# Patient Record
Sex: Male | Born: 1955
Health system: Southern US, Community
[De-identification: ages and names within clinical notes are randomized; demographics above are authoritative.]

## PROBLEM LIST (undated history)

## (undated) DIAGNOSIS — K219 Gastro-esophageal reflux disease without esophagitis: Secondary | ICD-10-CM

## (undated) DIAGNOSIS — I10 Essential (primary) hypertension: Secondary | ICD-10-CM

## (undated) DIAGNOSIS — E079 Disorder of thyroid, unspecified: Secondary | ICD-10-CM

## (undated) DIAGNOSIS — J45909 Unspecified asthma, uncomplicated: Secondary | ICD-10-CM

## (undated) DIAGNOSIS — T7840XA Allergy, unspecified, initial encounter: Secondary | ICD-10-CM

## (undated) HISTORY — DX: Gastro-esophageal reflux disease without esophagitis: K21.9

## (undated) HISTORY — DX: Allergy, unspecified, initial encounter: T78.40XA

## (undated) HISTORY — DX: Unspecified asthma, uncomplicated: J45.909

## (undated) HISTORY — PX: SPINE SURGERY: SHX786

## (undated) HISTORY — PX: LUMBAR DISC ARTHROPLASTY: SHX699

---

## 2000-05-11 ENCOUNTER — Ambulatory Visit (HOSPITAL_COMMUNITY): Admission: RE | Admit: 2000-05-11 | Discharge: 2000-05-11 | Payer: Self-pay | Admitting: *Deleted

## 2004-04-23 ENCOUNTER — Encounter: Admission: RE | Admit: 2004-04-23 | Discharge: 2004-04-23 | Payer: Self-pay | Admitting: Family Medicine

## 2006-08-02 ENCOUNTER — Encounter: Admission: RE | Admit: 2006-08-02 | Discharge: 2006-08-02 | Payer: Self-pay | Admitting: Gastroenterology

## 2008-04-10 ENCOUNTER — Encounter: Payer: Self-pay | Admitting: Pulmonary Disease

## 2008-05-12 ENCOUNTER — Ambulatory Visit: Payer: Self-pay | Admitting: Pulmonary Disease

## 2008-05-12 DIAGNOSIS — R05 Cough: Secondary | ICD-10-CM

## 2008-05-12 DIAGNOSIS — R059 Cough, unspecified: Secondary | ICD-10-CM

## 2008-05-12 DIAGNOSIS — R052 Subacute cough: Secondary | ICD-10-CM

## 2008-05-12 DIAGNOSIS — I10 Essential (primary) hypertension: Secondary | ICD-10-CM | POA: Insufficient documentation

## 2008-05-12 DIAGNOSIS — E1159 Type 2 diabetes mellitus with other circulatory complications: Secondary | ICD-10-CM | POA: Insufficient documentation

## 2008-05-12 HISTORY — DX: Cough, unspecified: R05.9

## 2008-05-12 HISTORY — DX: Subacute cough: R05.2

## 2012-01-06 ENCOUNTER — Emergency Department (HOSPITAL_COMMUNITY)
Admission: EM | Admit: 2012-01-06 | Discharge: 2012-01-06 | Disposition: A | Discharge status: Home / Self Care | Payer: BC Managed Care – PPO | Source: Home / Self Care

## 2012-01-06 ENCOUNTER — Encounter (HOSPITAL_COMMUNITY): Payer: Self-pay | Admitting: *Deleted

## 2012-01-06 DIAGNOSIS — R51 Headache: Secondary | ICD-10-CM

## 2012-01-06 DIAGNOSIS — R220 Localized swelling, mass and lump, head: Secondary | ICD-10-CM

## 2012-01-06 DIAGNOSIS — R221 Localized swelling, mass and lump, neck: Secondary | ICD-10-CM

## 2012-01-06 HISTORY — DX: Essential (primary) hypertension: I10

## 2012-01-06 MED ORDER — AMLODIPINE BESYLATE 5 MG PO TABS: 5.0000 mg | ORAL_TABLET | Freq: Every day | ORAL | Status: DC

## 2012-01-06 NOTE — ED Notes (Signed)
Pt     Reports    He         Ran out  Of       His      norvasc       He  Reports   Having  A  Headache   As  Well  -  He  Is      Sitting  Upright  On  The    Exam table  Speaking in  Complete       sentances            His  pearla   Speech is  clar   Skin is  Warm  And   Dry

## 2012-01-06 NOTE — ED Provider Notes (Signed)
History     CSN: [id number]  Arrival date & time 01/06/12  0806   None     Chief Complaint  Patient presents with  . Medication Refill    (Consider location/radiation/quality/duration/timing/severity/associated sxs/prior treatment) HPI CC: HA, Mass  HA: 2 mo h/o R sided HA that comes and goes. Typically lasts about 1 hr. Improves w/ rest. Out of BP medications for last 2 mo. Previous doctor no longer working so unable to get Rx refilled. Denies any vision loss, LOC, dizziness, change in speech or hearing, no loss of motor function  Mass: Present for 6 mo in R occipital region at skull base. Non-painful to palpation. No irritation. Denies fevers, night sweats, unintentional wt loss,   Past Medical History  Diagnosis Date  . Hypertension     History reviewed. No pertinent past surgical history.  No family history on file.  History  Substance Use Topics  . Smoking status: Not on file  . Smokeless tobacco: Not on file  . Alcohol Use:    No family h/o malignancy   Review of Systems Per hpi Allergies  Review of patient's allergies indicates not on file.  Home Medications   Current Outpatient Rx  Name Route Sig Dispense Refill  . AMLODIPINE BESYLATE 5 MG PO TABS Oral Take 1 tablet (5 mg total) by mouth daily. 30 tablet 1    BP 156/90  Pulse 60  Temp 97.8 F (36.6 C) (Oral)  Resp 12  SpO2 100%  Physical Exam Gen: NAD HEENT: PERRL, EOMI, no papillary edema, oropharynx clear, no cervical lymphadenopathy, Large 1.5x2cm firm non-movable mass at R skull base. Nonpainful to palpation. Hearing and vision symmetrical Neuro: CN intact, cerebellar and proprioception normal, gait normal CV: RRR  ED Course  Procedures (including critical care time)  Labs Reviewed - No data to display No results found.   1. Mass of head   2. Headache       MDM  56yo m w/ HA likley from uncontrolled HTN w/ new mass - Refill Amlodipine - Refer to general surgery - refer to  FM for f/u care        Ozella Rocks, MD 01/06/12 [phone]

## 2012-01-06 NOTE — ED Provider Notes (Signed)
Medical screening examination/treatment/procedure(s) were performed by resident physician or non-physician practitioner and as supervising physician I was immediately available for consultation/collaboration.   Barkley Bruns MD.    Linna Hoff, MD 01/06/12 1258

## 2012-01-12 ENCOUNTER — Emergency Department (HOSPITAL_COMMUNITY)
Admission: EM | Admit: 2012-01-12 | Discharge: 2012-01-12 | Disposition: A | Discharge status: Home / Self Care | Payer: BC Managed Care – PPO | Source: Home / Self Care | Attending: Emergency Medicine | Admitting: Emergency Medicine

## 2012-01-12 ENCOUNTER — Encounter (HOSPITAL_COMMUNITY): Payer: Self-pay | Admitting: *Deleted

## 2012-01-12 DIAGNOSIS — M722 Plantar fascial fibromatosis: Secondary | ICD-10-CM

## 2012-01-12 MED ORDER — METHYLPREDNISOLONE ACETATE 40 MG/ML IJ SUSP
INTRAMUSCULAR | Status: AC
  Filled 2012-01-12: qty 5

## 2012-01-12 NOTE — ED Provider Notes (Signed)
Chief Complaint  Patient presents with  . Foot Pain    History of Present Illness:   The patient is a 56 year old [place] male who has had a four-day history of pain in the plantar aspect of his right heel. He denies any injury or swelling. It hurts worse first thing in the morning and after prolonged standing or walking. He denies any pain in the calf, ankle, or dorsum of the foot. He denies any numbness, tingling, weakness of the lower extremities.  Review of Systems:  Other than noted above, the patient denies any of the following symptoms: Systemic:  No fevers, chills, sweats, or aches.  No fatigue or tiredness. Musculoskeletal:  No joint pain, arthritis, bursitis, swelling, back pain, or neck pain. Neurological:  No muscular weakness, paresthesias, headache, or trouble with speech or coordination.  No dizziness.  PMFSH:  Past medical history, family history, social history, meds, and allergies were reviewed.  Physical Exam:   Vital signs:  BP 149/92  Pulse 81  Temp 99.5 F (37.5 C) (Oral)  Resp 18  SpO2 97% Gen:  Alert and oriented times 3.  In no distress. Musculoskeletal: There was pain to palpation over the insertion of the plantar fascia no swelling. Otherwise, all joints had a full a ROM with no swelling, bruising or deformity.  No edema, pulses full. Extremities were warm and pink.  Capillary refill was brisk.  Skin:  Clear, warm and dry.  No rash. Neuro:  Alert and oriented times 3.  Muscle strength was normal.  Sensation was intact to light touch.   Procedure Note:  Verbal informed consent was obtained from the patient.  Risks and benefits were outlined with the patient.  Patient understands and accepts these risks.  Identity of the patient was confirmed verbally and by armband.    Procedure was performed as followed:  Medial aspect of the foot was prepped with Betadine and alcohol and anesthetized with ethyl chloride spray. Using a 1-1/2 inch 27-gauge needle, 1 cc of  Depo-Medrol 40 mg strength and 1 cc of 2% Xylocaine were injected around the insertion of the plantar fascia.  Patient tolerated the procedure well without any immediate complications. He was given aftercare instructions.  Assessment:  The encounter diagnosis was Plantar fasciitis.  Plan:   1.  The following meds were prescribed:   New Prescriptions   No medications on file   2.  The patient was instructed in symptomatic care, including rest and activity, elevation, application of ice and compression.  Appropriate handouts were given. I suggested that he rest for 2-3 days, stay off his feet, and apply ice. Thereafter he should begin stretching exercises. 3.  The patient was told to return if becoming worse in any way, if no better in 3 or 4 days, and given some red flag symptoms that would indicate earlier return.   4.  The patient was told to follow up with Dr. Cristie Hem if no better in 2 weeks.   Reuben Likes, MD 01/12/12 2207

## 2012-01-12 NOTE — ED Notes (Signed)
Pt reports right heel pain with no known injury for the past 4 days

## 2012-02-15 ENCOUNTER — Encounter (HOSPITAL_COMMUNITY): Payer: Self-pay | Admitting: *Deleted

## 2012-02-15 ENCOUNTER — Emergency Department (HOSPITAL_COMMUNITY)
Admission: EM | Admit: 2012-02-15 | Discharge: 2012-02-15 | Disposition: A | Discharge status: Home / Self Care | Payer: BC Managed Care – PPO | Source: Home / Self Care

## 2012-02-15 ENCOUNTER — Emergency Department (INDEPENDENT_AMBULATORY_CARE_PROVIDER_SITE_OTHER): Payer: BC Managed Care – PPO

## 2012-02-15 DIAGNOSIS — M722 Plantar fascial fibromatosis: Secondary | ICD-10-CM

## 2012-02-15 MED ORDER — ALBUTEROL SULFATE HFA 108 (90 BASE) MCG/ACT IN AERS: 1.0000 | INHALATION_SPRAY | Freq: Four times a day (QID) | RESPIRATORY_TRACT | Status: DC | PRN

## 2012-02-15 MED ORDER — AZITHROMYCIN 250 MG PO TABS: ORAL_TABLET | ORAL | Status: DC

## 2012-02-15 NOTE — ED Provider Notes (Signed)
Medical screening examination/treatment/procedure(s) were performed by non-physician practitioner and as supervising physician I was immediately available for consultation/collaboration.  Leslee Home, M.D.   Reuben Likes, MD 02/15/12 2215

## 2012-02-15 NOTE — ED Provider Notes (Signed)
History     CSN: [id number]  Arrival date & time 02/15/12  1626   None     Chief Complaint  Patient presents with  . Cough    (Consider location/radiation/quality/duration/timing/severity/associated sxs/prior treatment) Patient is a 56 y.o. male presenting with cough. The history is provided by the patient. No language interpreter was used.  Cough This is a new problem. The current episode started more than 1 week ago. The problem occurs constantly. The problem has been gradually worsening. The cough is productive of sputum. There has been no fever. Associated symptoms include sore throat and shortness of breath. The treatment provided moderate relief. His past medical history does not include bronchitis or pneumonia.    Past Medical History  Diagnosis Date  . Hypertension     History reviewed. No pertinent past surgical history.  History reviewed. No pertinent family history.  History  Substance Use Topics  . Smoking status: Former Smoker -- 15 years  . Smokeless tobacco: Not on file  . Alcohol Use: No      Review of Systems  HENT: Positive for sore throat.   Respiratory: Positive for cough and shortness of breath.   All other systems reviewed and are negative.    Allergies  Review of patient's allergies indicates no known allergies.  Home Medications   Current Outpatient Rx  Name  Route  Sig  Dispense  Refill  . AMLODIPINE BESYLATE 5 MG PO TABS   Oral   Take 1 tablet (5 mg total) by mouth daily.   30 tablet   1     BP 128/87  Pulse 63  Temp 97.8 F (36.6 C) (Oral)  Resp 18  SpO2 100%  Physical Exam  Nursing note and vitals reviewed. Constitutional: He appears well-developed and well-nourished.  HENT:  Head: Normocephalic and atraumatic.  Right Ear: External ear normal.  Left Ear: External ear normal.  Nose: Nose normal.  Mouth/Throat: Oropharynx is clear and moist.  Eyes: Conjunctivae normal and EOM are normal. Pupils are equal, round, and  reactive to light.  Neck: Normal range of motion. Neck supple.  Cardiovascular: Normal rate and normal heart sounds.   Pulmonary/Chest: Effort normal and breath sounds normal.  Abdominal: Soft.  Musculoskeletal: Normal range of motion.  Neurological: He is alert.  Skin: Skin is warm.  Psychiatric: He has a normal mood and affect.    ED Course  Procedures (including critical care time)  Labs Reviewed - No data to display Dg Chest 2 View  02/15/2012  *RADIOLOGY REPORT*  Clinical Data: Cough and fever  CHEST - 2 VIEW  Comparison: None.  Findings: Lungs clear.  Heart size and pulmonary vascularity are normal.  No adenopathy.  No bone lesions.  IMPRESSION: Lungs clear.   Original Report Authenticated By: Bretta Bang, M.D.      No diagnosis found.    MDM  rx for zpack and albuterol.           Lonia Skinner [place], [place] 02/15/12 1956

## 2012-02-15 NOTE — ED Notes (Signed)
Cough onset last Monday.  Had a sore throat first for 1 day Sunday.  No runny nose or earache. No fever. Cough kept him up last night.  Cough prod. green sputum from throat.

## 2012-04-05 ENCOUNTER — Ambulatory Visit (INDEPENDENT_AMBULATORY_CARE_PROVIDER_SITE_OTHER): Payer: BC Managed Care – PPO | Admitting: Family Medicine

## 2012-04-05 VITALS — BP 148/83 | HR 74 | Temp 98.0°F | Resp 16 | Ht 68.5 in | Wt 170.8 lb

## 2012-04-05 DIAGNOSIS — I10 Essential (primary) hypertension: Secondary | ICD-10-CM

## 2012-04-05 DIAGNOSIS — J40 Bronchitis, not specified as acute or chronic: Secondary | ICD-10-CM

## 2012-04-05 MED ORDER — METHYLPREDNISOLONE 4 MG PO KIT: PACK | ORAL | Status: DC

## 2012-04-05 MED ORDER — AMLODIPINE BESYLATE 5 MG PO TABS: 5.0000 mg | ORAL_TABLET | Freq: Every day | ORAL | Status: DC

## 2012-04-05 NOTE — Progress Notes (Signed)
Patient ID: Dilraj Killgore MRN: [id number], DOB: [birth year], 56 y.o. Date of Encounter: 04/05/2012, 12:07 PM  Primary Physician: No primary provider on file.  Chief Complaint:  Chief Complaint  Patient presents with  . Cough  . Sore Throat    HPI: 56 y.o. year old male presents with a 30 day history of nasal congestion, post nasal drip, sore throat, and cough. Mild sinus pressure. Afebrile. No chills. Nasal congestion thick and green/yellow. Cough is productive of green/yellow sputum and not associated with time of day. Ears feel full, leading to sensation of muffled hearing. Has tried OTC cold preps without success. No GI complaints.   Every year he gets a bad cough at this time.  No sick contacts, recent antibiotics, or recent travels.   No leg trauma, sedentary periods, h/o cancer, or tobacco use.  Past Medical History  Diagnosis Date  . Hypertension      Home Meds: Prior to Admission medications   Medication Sig Start Date End Date Taking? Authorizing Provider  amLODipine (NORVASC) 5 MG tablet Take 1 tablet (5 mg total) by mouth daily. 01/06/12  Yes Ozella Rocks, MD  albuterol (PROVENTIL HFA;VENTOLIN HFA) 108 (90 BASE) MCG/ACT inhaler Inhale 1-2 puffs into the lungs every 6 (six) hours as needed for wheezing. 02/15/12   Elson Areas, PA  azithromycin [facility]) 250 MG tablet 2 tablets first day then 2 tablets days two-five 02/15/12   Elson Areas, PA    Allergies: No Known Allergies  History   Social History  . Marital Status: Married    Spouse Name: N/A    Number of Children: N/A  . Years of Education: N/A   Occupational History  . Not on file.   Social History Main Topics  . Smoking status: Former Smoker -- 15 years  . Smokeless tobacco: Not on file  . Alcohol Use: No  . Drug Use: No  . Sexually Active: Not on file   Other Topics Concern  . Not on file   Social History Narrative  . No narrative on file     Review of Systems: Constitutional:  negative for chills, fever, night sweats or weight changes Cardiovascular: negative for chest pain or palpitations Respiratory: negative for hemoptysis, wheezing, or shortness of breath Abdominal: negative for abdominal pain, nausea, vomiting or diarrhea Dermatological: negative for rash Neurologic: negative for headache   Physical Exam: Blood pressure 148/83, pulse 74, temperature 98 F (36.7 C), temperature source Oral, resp. rate 16, height 5' 8.5" (1.74 m), weight 170 lb 12.8 oz (77.474 kg), SpO2 97.00%., Body mass index is 25.59 kg/(m^2). General: Well developed, well nourished, in no acute distress. Head: Normocephalic, atraumatic, eyes without discharge, sclera non-icteric, nares are congested. Bilateral auditory canals clear, TM's are without perforation, pearly grey with reflective cone of light bilaterally. No sinus TTP. Oral cavity moist, dentition normal. Posterior pharynx with post nasal drip and mild erythema. No peritonsillar abscess or tonsillar exudate. Neck: Supple. No thyromegaly. Full ROM. No lymphadenopathy. Lungs: Coarse breath sounds bilaterally with wheezes and rhonchi. Breathing is unlabored.  Heart: RRR with S1 S2. No murmurs, rubs, or gallops appreciated. Msk:  Strength and tone normal for age. Extremities: No clubbing or cyanosis. No edema. Neuro: Alert and oriented X 3. Moves all extremities spontaneously. CNII-XII grossly in tact. Psych:  Responds to questions appropriately with a normal affect.   Labs:   ASSESSMENT AND PLAN:  56 y.o. year old male with bronchitis. - -Mucinex -Tylenol/Motrin prn -Rest/fluids -RTC  precautions -RTC 3-5 days if no improvement  Signed, Elvina Sidle, MD 04/05/2012 12:07 PM

## 2012-04-05 NOTE — Patient Instructions (Addendum)
Asthma, Adult Asthma is caused by narrowing of the air passages in the lungs. It may be triggered by pollen, dust, animal dander, molds, some foods, respiratory infections, exposure to smoke, exercise, emotional stress or other allergens (things that cause allergic reactions or allergies). Repeat attacks are common. HOME CARE INSTRUCTIONS   Use prescription medications as ordered by your caregiver.  Avoid pollen, dust, animal dander, molds, smoke and other things that cause attacks at home and at work.  You may have fewer attacks if you decrease dust in your home. Electrostatic air cleaners may help.  It may help to replace your pillows or mattress with materials less likely to cause allergies.  Talk to your caregiver about an action plan for managing asthma attacks at home, including, the use of a peak flow meter which measures the severity of your asthma attack. An action plan can help minimize or stop the attack without having to seek medical care.  If you are not on a fluid restriction, drink 8 to 10 glasses of water each day.  Always have a plan prepared for seeking medical attention, including, calling your physician, accessing local emergency care, and calling 911 (in the [place] for a severe attack.  Discuss possible exercise routines with your caregiver.  If animal dander is the cause of asthma, you may need to get rid of pets. SEEK MEDICAL CARE IF:   You have wheezing and shortness of breath even if taking medicine to prevent attacks.  You have muscle aches, chest pain or thickening of sputum.  Your sputum changes from clear or white to yellow, green, gray, or bloody.  You have any problems that may be related to the medicine you are taking (such as a rash, itching, swelling or trouble breathing). SEEK IMMEDIATE MEDICAL CARE IF:   Your usual medicines do not stop your wheezing or there is increased coughing and/or shortness of breath.  You have increased difficulty  breathing.  You have a fever. MAKE SURE YOU:   Understand these instructions.  Will watch your condition.  Will get help right away if you are not doing well or get worse. Document Released: 03/28/2005 Document Revised: 06/20/2011 Document Reviewed: 11/14/2007 [facility] Patient Information 2013 [place], [place]. B?nh Suy?n ? Ng??i L?n (Asthma, Adult) B?nh suy?n l do ???ng d?n khng kh t?i ph?i b? thu h?p. B?nh ny c th? pht sinh do ph?n, b?i, lng th, ??t x?p, m?t s? th?c ph?m, nhi?m trng ???ng h h?p, ti?p xc v?i khi, t?p th? d?c, tnh tr?ng c?ng th?ng tinh th?n ho?c cc ch?t gy d? ?ng khc (nh?ng th? gy ph?n ?ng d? ?ng ho?c d? ?ng). Nh?ng c?n suy?n l?p l?i l ph? bi?n. H??NG D?N CH?M [place] T?I NH  U?ng thu?c c k ??n theo yu c?u c?a bc s?[place] ti?p xc v?i ph?n, b?i, lng ??ng v?t, ??t x?p, khi v nh?ng th? khc c th? gy ra nh?ng c?n hen suy?n ? nh v t?i n?i lm vi?c.  B?n c th? t b? nh?ng c?n suy?n h?n n?u b?n gi?m b?t b?i trong nh. My ht b?i khng kh b?ng t?nh ?i?n c th? gip b?n.  Thay g?i ho?c n?m b?ng nh?ng ch?t li?u t gy d? ?ng h?n c th? gip cho b?n.  Bo cho Bc s? bi?t k? ho?ch x? tr c?n suy?n t?i nh bao g?m c? vi?c s? d?ng d?ng c? ?o thng kh t?i ?a gip ?nh gi m?c ?? n?ng c?a c?n suy?n. K? ho?ch x? tr c  th? gip gi?m ??n m?c t?i thi?u hay ch?m d?t c?n suy?n m khng c?n nh? ??n s? tr? gip v? y t?.  N?u khng b? h?n ch? s? d?ng n??c, hy u?ng 8 ??n 10 ly n??c m?i ngy.  Lun lun c k? ho?ch chu?n b? nh? ??n s? tr? gip v? y t? bao g?m c? vi?c g?i ?i?n tho?i cho Bc s?, ??n trung tm c?p c?u khu v?c, v g?i ?i?n tho?i s? 911 cho nh?ng c?n suy?n tr?m tr?ng.  Trao ??i v?i bc s? c?a b?n v? sinh ho?t t?p th? d?c ??u ??n m b?n c th? lm.  N?u nguyn nhn gy suy?n l lng th, c th? b?n s? ph?i ng?ng nui ??ng v?t trong nh. HY THAM V?N V?I CHUYN GIA Y T? N?U:  B?n th? kh kh ho?c th? g?p ngay c? khi ? u?ng thu?c ?? phng ng?a  nh?ng c?n suy?n.  B?n b? ?au c?, ?au ng?c ho?c ??m tr? nn ??c l?i.  ??m c?a b?n chuy?n t? mu trong ho?c tr?ng sang vng, xanh, xm ho?c c mu.  B?n c b?t k? tr?c tr?c no c th? lin quan ??n lo?i thu?c b?n ?ang u?ng (v d? nh? pht ban, ng?a, s?ng ho?c kh th?). HY NGAY L?P T?C THAM V?N V?I CHUYN GIA Y T? N?U:  Lo?i thu?c b?n th??ng dng khng lm b?n h?t th? kh kh, ho?c b?n ngy cng b? ho ho?c th? g?p nhi?u h?n.  B?n ngy cng kh th?.  B?n b? s?t. HY CH?C CH?N R?NG B?N:  Hi?u r nh?ng h??ng d?n khi xu?t vi?n.  S? theo di tnh tr?ng b?nh c?a b?n.  S? ??n khm b?nh ngay l?p t?c nh? ? ???c h??ng d?n. Document Released: 03/28/2005 Document Revised: 06/20/2011 [facility] Patient Information 2013 [place], [place].

## 2012-05-27 ENCOUNTER — Ambulatory Visit (INDEPENDENT_AMBULATORY_CARE_PROVIDER_SITE_OTHER): Payer: BC Managed Care – PPO | Admitting: Internal Medicine

## 2012-05-27 ENCOUNTER — Telehealth: Payer: Self-pay

## 2012-05-27 VITALS — BP 133/87 | HR 68 | Temp 97.7°F | Resp 16 | Ht 68.5 in | Wt 168.2 lb

## 2012-05-27 DIAGNOSIS — R112 Nausea with vomiting, unspecified: Secondary | ICD-10-CM

## 2012-05-27 DIAGNOSIS — K3189 Other diseases of stomach and duodenum: Secondary | ICD-10-CM

## 2012-05-27 DIAGNOSIS — D509 Iron deficiency anemia, unspecified: Secondary | ICD-10-CM

## 2012-05-27 DIAGNOSIS — R1013 Epigastric pain: Secondary | ICD-10-CM

## 2012-05-27 DIAGNOSIS — R059 Cough, unspecified: Secondary | ICD-10-CM

## 2012-05-27 DIAGNOSIS — R05 Cough: Secondary | ICD-10-CM

## 2012-05-27 DIAGNOSIS — R51 Headache: Secondary | ICD-10-CM

## 2012-05-27 LAB — COMPREHENSIVE METABOLIC PANEL
ALT: 32 U/L (ref 0–53)
Albumin: 4.7 g/dL (ref 3.5–5.2)
Alkaline Phosphatase: 73 U/L (ref 39–117)
CO2: 26 mEq/L (ref 19–32)
Potassium: 4.2 mEq/L (ref 3.5–5.3)
Sodium: 142 mEq/L (ref 135–145)
Total Bilirubin: 0.7 mg/dL (ref 0.3–1.2)
Total Protein: 7.5 g/dL (ref 6.0–8.3)

## 2012-05-27 LAB — POCT CBC
Granulocyte percent: 63 %G (ref 37–80)
HCT, POC: 42.6 % — AB (ref 43.5–53.7)
Hemoglobin: 13.5 g/dL — AB (ref 14.1–18.1)
MCV: 84.2 fL (ref 80–97)
MID (cbc): 0.7 (ref 0–0.9)
Platelet Count, POC: 214 10*3/uL (ref 142–424)
RBC: 5.06 M/uL (ref 4.69–6.13)

## 2012-05-27 LAB — FERRITIN: Ferritin: 576 ng/mL — ABNORMAL HIGH (ref 22–322)

## 2012-05-27 MED ORDER — BUTALBITAL-APAP-CAFFEINE 50-325-40 MG PO TABS: 1.0000 | ORAL_TABLET | Freq: Four times a day (QID) | ORAL | Status: DC | PRN

## 2012-05-27 MED ORDER — PANTOPRAZOLE SODIUM 40 MG PO TBEC: 40.0000 mg | DELAYED_RELEASE_TABLET | Freq: Every day | ORAL | Status: DC

## 2012-05-27 NOTE — Telephone Encounter (Signed)
Spoke with pharmacy and they needed directions for Fioricet. Directions given.

## 2012-05-27 NOTE — Telephone Encounter (Signed)
[facility] has question about rx just sent over by Dr Merla Riches.  Best [phone]

## 2012-05-27 NOTE — Progress Notes (Signed)
  Subjective:    Patient ID: Rosina Lowenstein, male    DOB: [birth year], 57 y.o.   MRN: [id number]  HPI  57 year old male presents with one month of feeling bad.  Had a headache and started feeling sick on stomach.  Throwing up after he eats.  Been having a burning feeling in chest.  Feels weaker but not sure about weight loss.  Headache is causing one side of head and ear to feel different.  No sinus issues but has been coughing.  Was seen in December for cough but got better from that.      Review of Systems     Objective:   Physical Exam        Results for orders placed in visit on 05/27/12  POCT CBC      Result Value Range   WBC 7.3  4.6 - 10.2 K/uL   Lymph, poc 2.0  0.6 - 3.4   POC LYMPH PERCENT 27.5  10 - 50 %L   MID (cbc) 0.7  0 - 0.9   POC MID % 9.5  0 - 12 %M   POC Granulocyte 4.6  2 - 6.9   Granulocyte percent 63.0  37 - 80 %G   RBC 5.06  4.69 - 6.13 M/uL   Hemoglobin 13.5 (*) 14.1 - 18.1 g/dL   HCT, POC 40.9 (*) 81.1 - 53.7 %   MCV 84.2  80 - 97 fL   MCH, POC 26.7 (*) 27 - 31.2 pg   MCHC 31.7 (*) 31.8 - 35.4 g/dL   RDW, POC 91.4     Platelet Count, POC 214  142 - 424 K/uL   MPV 10.9  0 - 99.8 fL    Assessment & Plan:

## 2012-05-27 NOTE — Progress Notes (Signed)
  Subjective:    Patient ID: Paul Edwards, male    DOB: 1955-09-18, 57 y.o.   MRN: [id number]  HPI complaining of headache daily for the past few months Using Goody powders daily No change in vision/these headaches start at any point during the day without precipitating factors Language barrier may be interfering with some questions but apparently there is no fever night sweats or weight loss  He also is complaining of throwing up every time he eats/as soon as the food hits his stomach he regurgitates//he has noticed dyspepsia-epigastric burning No melena/no change in bowel movements   Past medical history-on medication for hypertension/history of reactive airway disease but none recent  Review of Systems No fever chills or night sweats No weight loss No chest pain or palpitations    Objective:   Physical Exam  Vital signs stable HEENT clear with pupils equal round reactive to light and accommodation and extraocular movements intact No thyromegaly Heart regular without murmur Lungs clear Abdomen soft nontender with no organomegaly or masses Cranial nerves II through XII intact Finger to nose intact Deep tendon reflexes symmetrical Gait normal Cerebellar intact    Results for orders placed in visit on 05/27/12  POCT CBC      Result Value Range   WBC 7.3  4.6 - 10.2 K/uL   Lymph, poc 2.0  0.6 - 3.4   POC LYMPH PERCENT 27.5  10 - 50 %L   MID (cbc) 0.7  0 - 0.9   POC MID % 9.5  0 - 12 %M   POC Granulocyte 4.6  2 - 6.9   Granulocyte percent 63.0  37 - 80 %G   RBC 5.06  4.69 - 6.13 M/uL   Hemoglobin 13.5 (*) 14.1 - 18.1 g/dL   HCT, POC 96.2 (*) 95.2 - 53.7 %   MCV 84.2  80 - 97 fL   MCH, POC 26.7 (*) 27 - 31.2 pg   MCHC 31.7 (*) 31.8 - 35.4 g/dL   RDW, POC 84.1     Platelet Count, POC 214  142 - 424 K/uL   MPV 10.9  0 - 99.8 fL    Assessment & Plan:  Problem #1 chronic headache Problem #2 nausea with vomiting/epigastric distress Problem #3 microcytic anemia  Will  obtain hemosure Check hemoglobin electrophoresis and ferritin Check H. Pylori/comprehensive metabolic profile  Start Meds ordered this encounter  Medications  . DISCONTD: Aspirin-Acetaminophen (GOODYS BODY PAIN PO)    Sig: Take by mouth.  . butalbital-acetaminophen-caffeine (ESGIC) 50-325-40 MG per tablet    Sig: Take 1 tablet by mouth every 6 (six) hours as needed for headache (headache).    Dispense:  25 tablet    Refill:  0  . pantoprazole (PROTONIX) 40 MG tablet    Sig: Take 1 tablet (40 mg total) by mouth daily.    Dispense:  30 tablet    Refill:  3   Call w/ labs and f/u

## 2012-05-28 ENCOUNTER — Encounter: Payer: Self-pay | Admitting: Internal Medicine

## 2012-05-28 LAB — H. PYLORI ANTIBODY, IGG: H Pylori IgG: 0.77 {ISR}

## 2012-05-29 LAB — HEMOGLOBINOPATHY EVALUATION
Hemoglobin Other: 0 %
Hgb A: 97.5 % (ref 96.8–97.8)

## 2012-06-03 ENCOUNTER — Encounter: Payer: Self-pay | Admitting: Internal Medicine

## 2012-06-05 ENCOUNTER — Encounter: Payer: Self-pay | Admitting: Internal Medicine

## 2012-06-10 ENCOUNTER — Ambulatory Visit (INDEPENDENT_AMBULATORY_CARE_PROVIDER_SITE_OTHER): Payer: BC Managed Care – PPO | Admitting: Emergency Medicine

## 2012-06-10 VITALS — BP 110/68 | HR 67 | Temp 98.2°F | Resp 16 | Ht 68.0 in | Wt 169.8 lb

## 2012-06-10 DIAGNOSIS — R112 Nausea with vomiting, unspecified: Secondary | ICD-10-CM

## 2012-06-10 LAB — COMPREHENSIVE METABOLIC PANEL
ALT: 29 U/L (ref 0–53)
AST: 19 U/L (ref 0–37)
Albumin: 4.5 g/dL (ref 3.5–5.2)
Calcium: 9.7 mg/dL (ref 8.4–10.5)
Chloride: 103 mEq/L (ref 96–112)
Potassium: 3.8 mEq/L (ref 3.5–5.3)

## 2012-06-10 MED ORDER — SUCRALFATE 1 G PO TABS: 1.0000 g | ORAL_TABLET | Freq: Four times a day (QID) | ORAL | Status: DC

## 2012-06-10 NOTE — Progress Notes (Signed)
[facility] [address] [place] [address] [phone]  Date:  06/10/2012   Name:  Paul Edwards   DOB:  12-Sep-1955   MRN:  [id number]  PCP:  No primary provider on file.    Chief Complaint: Headache   History of Present Illness:  Paul Edwards is a 57 y.o. very pleasant male patient who presents with the following:  3 month history of frequent, daily headaches.  Respond for 30 minutes to tylenol or fioricet but recur.  Says headaches are on the right hemisphere and occiput.  No visual or speech difficulty. Says some weakness in arms with headaches. No disturbance of gait balance or coordination.  Has persistent nausea and vomiting.  Says worse when he eats.  Denies alcohol, tobacco, caffeine or ASA/NSAID use.  No history of gall bladder or PUD.  Poor appetite.  No vomiting blood or passing blood in stools.  Patient Active Problem List  Diagnosis  . HYPERTENSION  . Cough    Past Medical History  Diagnosis Date  . Hypertension     History reviewed. No pertinent past surgical history.  History  Substance Use Topics  . Smoking status: Former Smoker -- 15 years  . Smokeless tobacco: Not on file  . Alcohol Use: No    History reviewed. No pertinent family history.  No Known Allergies  Medication list has been reviewed and updated.  Current Outpatient Prescriptions on File Prior to Visit  Medication Sig Dispense Refill  . butalbital-acetaminophen-caffeine (ESGIC) 50-325-40 MG per tablet Take 1 tablet by mouth every 6 (six) hours as needed for headache (headache).  25 tablet  0  . amLODipine (NORVASC) 5 MG tablet Take 1 tablet (5 mg total) by mouth daily.  30 tablet  11  . pantoprazole (PROTONIX) 40 MG tablet Take 1 tablet (40 mg total) by mouth daily.  30 tablet  3   No current facility-administered medications on file prior to visit.    Review of Systems:  As per HPI, otherwise negative.    Physical Examination: Filed Vitals:   06/10/12  1323  BP: 110/68  Pulse: 67  Temp: 98.2 F (36.8 C)  Resp: 16   Filed Vitals:   06/10/12 1323  Height: 5\' 8"  (1.727 m)  Weight: 169 lb 12.8 oz (77.021 kg)   Body mass index is 25.82 kg/(m^2). Ideal Body Weight: Weight in (lb) to have BMI = 25: 164.1  GEN: WDWN, NAD, Non-toxic, A & O x 3 HEENT: Atraumatic, Normocephalic. Neck supple. No masses, No LAD. Ears and Nose: No external deformity. CV: RRR, No M/G/R. No JVD. No thrill. No extra heart sounds. PULM: CTA B, no wheezes, crackles, rhonchi. No retractions. No resp. distress. No accessory muscle use. ABD: S, NT, ND, +BS. No rebound. No HSM. EXTR: No c/c/e NEURO Normal gait.  PSYCH: Normally interactive. Conversant. Not depressed or anxious appearing.  Calm demeanor.    Assessment and Plan: Nausea and vomiting Abdominal pain Consider PUD and GB Labs Sonogram Neuro consult for daily headaches.   Carmelina Dane, MD

## 2012-06-10 NOTE — Patient Instructions (Addendum)
Nausea and Vomiting Nausea is a sick feeling that often comes before throwing up (vomiting). Vomiting is a reflex where stomach contents come out of your mouth. Vomiting can cause severe loss of body fluids (dehydration). Children and elderly adults can become dehydrated quickly, especially if they also have diarrhea. Nausea and vomiting are symptoms of a condition or disease. It is important to find the cause of your symptoms. CAUSES   Direct irritation of the stomach lining. This irritation can result from increased acid production (gastroesophageal reflux disease), infection, food poisoning, taking certain medicines (such as nonsteroidal anti-inflammatory drugs), alcohol use, or tobacco use.  Signals from the brain.These signals could be caused by a headache, heat exposure, an inner ear disturbance, increased pressure in the brain from injury, infection, a tumor, or a concussion, pain, emotional stimulus, or metabolic problems.  An obstruction in the gastrointestinal tract (bowel obstruction).  Illnesses such as diabetes, hepatitis, gallbladder problems, appendicitis, kidney problems, cancer, sepsis, atypical symptoms of a heart attack, or eating disorders.  Medical treatments such as chemotherapy and radiation.  Receiving medicine that makes you sleep (general anesthetic) during surgery. DIAGNOSIS Your caregiver may ask for tests to be done if the problems do not improve after a few days. Tests may also be done if symptoms are severe or if the reason for the nausea and vomiting is not clear. Tests may include:  Urine tests.  Blood tests.  Stool tests.  Cultures (to look for evidence of infection).  X-rays or other imaging studies. Test results can help your caregiver make decisions about treatment or the need for additional tests. TREATMENT You need to stay well hydrated. Drink frequently but in small amounts.You may wish to drink water, sports drinks, clear broth, or eat frozen  ice pops or gelatin dessert to help stay hydrated.When you eat, eating slowly may help prevent nausea.There are also some antinausea medicines that may help prevent nausea. HOME CARE INSTRUCTIONS   Take all medicine as directed by your caregiver.  If you do not have an appetite, do not force yourself to eat. However, you must continue to drink fluids.  If you have an appetite, eat a normal diet unless your caregiver tells you differently.  Eat a variety of complex carbohydrates (rice, wheat, potatoes, bread), lean meats, yogurt, fruits, and vegetables.  Avoid high-fat foods because they are more difficult to digest.  Drink enough water and fluids to keep your urine clear or pale yellow.  If you are dehydrated, ask your caregiver for specific rehydration instructions. Signs of dehydration may include:  Severe thirst.  Dry lips and mouth.  Dizziness.  Dark urine.  Decreasing urine frequency and amount.  Confusion.  Rapid breathing or pulse. SEEK IMMEDIATE MEDICAL CARE IF:   You have blood or brown flecks (like coffee grounds) in your vomit.  You have black or bloody stools.  You have a severe headache or stiff neck.  You are confused.  You have severe abdominal pain.  You have chest pain or trouble breathing.  You do not urinate at least once every 8 hours.  You develop cold or clammy skin.  You continue to vomit for longer than 24 to 48 hours.  You have a fever. MAKE SURE YOU:   Understand these instructions.  Will watch your condition.  Will get help right away if you are not doing well or get worse. Document Released: 03/28/2005 Document Revised: 06/20/2011 Document Reviewed: 08/25/2010 [employer] Patient Information 2013 [employer].  

## 2012-06-15 ENCOUNTER — Ambulatory Visit (HOSPITAL_COMMUNITY)
Admission: RE | Admit: 2012-06-15 | Discharge: 2012-06-15 | Disposition: A | Discharge status: Home / Self Care | Payer: BC Managed Care – PPO | Source: Ambulatory Visit | Attending: Emergency Medicine | Admitting: Emergency Medicine

## 2012-06-15 ENCOUNTER — Ambulatory Visit (HOSPITAL_COMMUNITY): Payer: BC Managed Care – PPO

## 2012-06-15 DIAGNOSIS — R109 Unspecified abdominal pain: Secondary | ICD-10-CM | POA: Insufficient documentation

## 2012-06-15 DIAGNOSIS — R112 Nausea with vomiting, unspecified: Secondary | ICD-10-CM | POA: Insufficient documentation

## 2012-06-15 DIAGNOSIS — R9389 Abnormal findings on diagnostic imaging of other specified body structures: Secondary | ICD-10-CM | POA: Insufficient documentation

## 2014-05-09 ENCOUNTER — Emergency Department (HOSPITAL_COMMUNITY)
Admission: EM | Admit: 2014-05-09 | Discharge: 2014-05-09 | Disposition: A | Discharge status: Home / Self Care | Payer: BLUE CROSS/BLUE SHIELD | Attending: Emergency Medicine | Admitting: Emergency Medicine

## 2014-05-09 ENCOUNTER — Encounter (HOSPITAL_COMMUNITY): Payer: Self-pay | Admitting: Physical Medicine and Rehabilitation

## 2014-05-09 ENCOUNTER — Emergency Department (HOSPITAL_COMMUNITY): Payer: BLUE CROSS/BLUE SHIELD

## 2014-05-09 DIAGNOSIS — R2231 Localized swelling, mass and lump, right upper limb: Secondary | ICD-10-CM | POA: Diagnosis not present

## 2014-05-09 DIAGNOSIS — D72829 Elevated white blood cell count, unspecified: Secondary | ICD-10-CM | POA: Diagnosis not present

## 2014-05-09 DIAGNOSIS — Z79899 Other long term (current) drug therapy: Secondary | ICD-10-CM | POA: Insufficient documentation

## 2014-05-09 DIAGNOSIS — M79601 Pain in right arm: Secondary | ICD-10-CM | POA: Diagnosis present

## 2014-05-09 DIAGNOSIS — M549 Dorsalgia, unspecified: Secondary | ICD-10-CM

## 2014-05-09 DIAGNOSIS — M545 Low back pain: Secondary | ICD-10-CM | POA: Diagnosis not present

## 2014-05-09 DIAGNOSIS — K219 Gastro-esophageal reflux disease without esophagitis: Secondary | ICD-10-CM | POA: Insufficient documentation

## 2014-05-09 DIAGNOSIS — I10 Essential (primary) hypertension: Secondary | ICD-10-CM | POA: Insufficient documentation

## 2014-05-09 DIAGNOSIS — Z87891 Personal history of nicotine dependence: Secondary | ICD-10-CM | POA: Insufficient documentation

## 2014-05-09 DIAGNOSIS — M79604 Pain in right leg: Secondary | ICD-10-CM | POA: Diagnosis not present

## 2014-05-09 DIAGNOSIS — M25431 Effusion, right wrist: Secondary | ICD-10-CM

## 2014-05-09 DIAGNOSIS — R609 Edema, unspecified: Secondary | ICD-10-CM

## 2014-05-09 LAB — BASIC METABOLIC PANEL
ANION GAP: 6 (ref 5–15)
BUN: 17 mg/dL (ref 6–23)
CHLORIDE: 101 mmol/L (ref 96–112)
CO2: 30 mmol/L (ref 19–32)
Calcium: 9.2 mg/dL (ref 8.4–10.5)
Creatinine, Ser: 1.03 mg/dL (ref 0.50–1.35)
GFR calc Af Amer: 90 mL/min — ABNORMAL LOW (ref >90)
GFR calc non Af Amer: 78 mL/min — ABNORMAL LOW (ref >90)
GLUCOSE: 83 mg/dL (ref 70–99)
Potassium: 3.9 mmol/L (ref 3.5–5.1)
SODIUM: 137 mmol/L (ref 135–145)

## 2014-05-09 LAB — URINALYSIS, ROUTINE W REFLEX MICROSCOPIC
BILIRUBIN URINE: NEGATIVE
Glucose, UA: NEGATIVE mg/dL
HGB URINE DIPSTICK: NEGATIVE
Ketones, ur: NEGATIVE mg/dL
LEUKOCYTES UA: NEGATIVE
Nitrite: NEGATIVE
Protein, ur: NEGATIVE mg/dL
Specific Gravity, Urine: 1.02 (ref 1.005–1.030)
UROBILINOGEN UA: 0.2 mg/dL (ref 0.0–1.0)
pH: 7.5 (ref 5.0–8.0)

## 2014-05-09 LAB — CBC WITH DIFFERENTIAL/PLATELET
BASOS PCT: 0 % (ref 0–1)
Basophils Absolute: 0 10*3/uL (ref 0.0–0.1)
EOS ABS: 0.4 10*3/uL (ref 0.0–0.7)
Eosinophils Relative: 3 % (ref 0–5)
HEMATOCRIT: 42.1 % (ref 39.0–52.0)
HEMOGLOBIN: 13.8 g/dL (ref 13.0–17.0)
LYMPHS PCT: 9 % — AB (ref 12–46)
Lymphs Abs: 1.2 10*3/uL (ref 0.7–4.0)
MCH: 26.3 pg (ref 26.0–34.0)
MCHC: 32.8 g/dL (ref 30.0–36.0)
MCV: 80.2 fL (ref 78.0–100.0)
MONO ABS: 1.5 10*3/uL — AB (ref 0.1–1.0)
Monocytes Relative: 11 % (ref 3–12)
NEUTROS PCT: 77 % (ref 43–77)
Neutro Abs: 10.2 10*3/uL — ABNORMAL HIGH (ref 1.7–7.7)
PLATELETS: 198 10*3/uL (ref 150–400)
RBC: 5.25 MIL/uL (ref 4.22–5.81)
RDW: 12.7 % (ref 11.5–15.5)
WBC: 13.4 10*3/uL — ABNORMAL HIGH (ref 4.0–10.5)

## 2014-05-09 MED ORDER — OXYCODONE-ACETAMINOPHEN 5-325 MG PO TABS: 1.0000 | ORAL_TABLET | ORAL | Status: DC | PRN

## 2014-05-09 MED ORDER — CEPHALEXIN 500 MG PO CAPS: 500.0000 mg | ORAL_CAPSULE | Freq: Four times a day (QID) | ORAL | Status: DC

## 2014-05-09 MED ORDER — OXYCODONE-ACETAMINOPHEN 5-325 MG PO TABS
2.0000 | ORAL_TABLET | Freq: Once | ORAL | Status: AC
  Administered 2014-05-09: 2 via ORAL
  Filled 2014-05-09: qty 2

## 2014-05-09 NOTE — Discharge Instructions (Signed)
Take the prescribed medication as directed. °Follow-up with your primary care physician. °Return to the ED for new or worsening symptoms. ° °

## 2014-05-09 NOTE — ED Notes (Addendum)
Pt reports R hand, arm and leg pain. Ongoing x2 days. 9/10 pain upon arrival. Pt states swelling and difficulty moving R hand. Pt is alert and oriented x4. Pt does not speak english, friend at bedside to translate.

## 2014-05-09 NOTE — ED Provider Notes (Signed)
CSN: [id number]     Arrival date & time 05/09/14  1004 History   First MD Initiated Contact with Patient 05/09/14 1105     Chief Complaint  Patient presents with  . Arm Pain  . Leg Pain     (Consider location/radiation/quality/duration/timing/severity/associated sxs/prior Treatment) Patient is a 59 y.o. male presenting with arm pain and leg pain. The history is provided by the patient and medical records. The history is limited by a language barrier. A language interpreter was used.  Arm Pain Associated symptoms include arthralgias and joint swelling.  Leg Pain   This is a 59 year old male with past medical history significant for hypertension and GERD, presenting to the ED for right hand/wrist pain as well as right leg pain. No reported injury, trauma, or falls.  Patient states this is been ongoing for the past 2 days. States right leg pain begins in his lower back and radiates down the entire length of his leg. He denies any numbness, paresthesias, or weakness. Pain exacerbated with weightbearing and ambulation.  Right wrist pain worse along dorsal aspect with some mild swelling and erythema.  No prior hx of gout.  No urinary symptoms, fever, chills, or flank pain.  No hx of kidney stones.  No chest pain or SOB.  No abdominal pain, nausea, vomiting, or diarrhea.  No intervention tried PTA.  Past Medical History  Diagnosis Date  . Hypertension    History reviewed. No pertinent past surgical history. History reviewed. No pertinent family history. History  Substance Use Topics  . Smoking status: Former Smoker -- 15 years  . Smokeless tobacco: Not on file  . Alcohol Use: No    Review of Systems  Musculoskeletal: Positive for joint swelling and arthralgias.  All other systems reviewed and are negative.     Allergies  Review of patient's allergies indicates no known allergies.  Home Medications   Prior to Admission medications   Medication Sig Start Date End Date Taking?  Authorizing Provider  amLODipine (NORVASC) 5 MG tablet Take 1 tablet (5 mg total) by mouth daily. 04/05/12   Elvina SidleKurt Lauenstein, MD  butalbital-acetaminophen-caffeine (ESGIC) 769 [id number] 751150-325-40 MG per tablet Take 1 tablet by mouth every 6 (six) hours as needed for headache (headache). 05/27/12   Tonye Pearsonobert P Doolittle, MD  pantoprazole (PROTONIX) 40 MG tablet Take 1 tablet (40 mg total) by mouth daily. 05/27/12   Tonye Pearsonobert P Doolittle, MD  sucralfate (CARAFATE) 1 G tablet Take 1 tablet (1 g total) by mouth 4 (four) times daily. 1 hour ac and hs 06/10/12   Carmelina DaneJeffery S Anderson, MD   BP 165/112 mmHg  Pulse 88  Temp(Src) 98.2 F (36.8 C) (Oral)  Resp 18  SpO2 96%   Physical Exam  Constitutional: He is oriented to person, place, and time. He appears well-developed and well-nourished.  HENT:  Head: Normocephalic and atraumatic.  Mouth/Throat: Oropharynx is clear and moist.  Eyes: Conjunctivae and EOM are normal. Pupils are equal, round, and reactive to light.  Neck: Normal range of motion.  Cardiovascular: Normal rate, regular rhythm and normal heart sounds.   Pulmonary/Chest: Effort normal and breath sounds normal. No respiratory distress. He has no wheezes.  Abdominal: Soft. Bowel sounds are normal. There is no tenderness. There is no guarding.  Musculoskeletal: Normal range of motion.       Right wrist: He exhibits swelling.       Lumbar back: He exhibits tenderness, bony tenderness and pain.       Back:  Right wrist  with apparent bug bite on radial aspect of wrist with adjacent dorsal wrist swelling, erythema, and warmth to touch; no induration or signs of cellulitis; no abscess formation; no streaking up arm, varicosities, or visible swelling of forearm or upper arm Right SI joint TTP without bony deformity; limited ROM due to pain; + SLR on right; normal strength and sensation of BLE; no calf asymmetry, tenderness, or palpable cords; no overlying erythema or warmth to touch  Neurological: He is alert and  oriented to person, place, and time.  Skin: Skin is warm and dry.  Psychiatric: He has a normal mood and affect.  Nursing note and vitals reviewed.   ED Course  Procedures (including critical care time) Labs Review Labs Reviewed  CBC WITH DIFFERENTIAL/PLATELET - Abnormal; Notable for the following:    WBC 13.4 (*)    Neutro Abs 10.2 (*)    Lymphocytes Relative 9 (*)    Monocytes Absolute 1.5 (*)    All other components within normal limits  BASIC METABOLIC PANEL - Abnormal; Notable for the following:    GFR calc non Af Amer 78 (*)    GFR calc Af Amer 90 (*)    All other components within normal limits  URINALYSIS, ROUTINE W REFLEX MICROSCOPIC    Imaging Review Dg Lumbar Spine Complete  05/09/2014   CLINICAL DATA:  Chronic low back and right leg pain.  EXAM: LUMBAR SPINE - COMPLETE 4+ VIEW  COMPARISON:  04/23/2004  FINDINGS: Normal alignment of the lumbar vertebral bodies. Disc spaces and vertebral bodies are maintained. The facets are normally aligned. No pars defects. The visualized bony pelvis is intact.  IMPRESSION: Normal alignment and no acute bony findings. Mild degenerative changes.   Electronically Signed   By: Loralie Champagne M.D.   On: 05/09/2014 13:01   Dg Wrist Complete Right  05/09/2014   CLINICAL DATA:  Erythematous swollen painful right wrist, onset last night, no report of trauma  EXAM: RIGHT WRIST - COMPLETE 3+ VIEW  COMPARISON:  None.  FINDINGS: The bones of the right wrist are adequately mineralized. There is no lytic or blastic lesion. There is no fracture nor dislocation. There is no significant degenerative change. No soft tissue gas collections are demonstrated. There is mild soft tissue swelling dorsally.  IMPRESSION: There is no acute or significant chronic bony abnormality of the right wrist.   Electronically Signed   By: David  [place]   On: 05/09/2014 13:02     EKG Interpretation None      MDM   Final diagnoses:  Swelling  Back pain, unspecified  location  Wrist swelling, right   59 year old male with back pain, right leg pain, and right hand pain.  He states his right leg pain begins in his back and radiates down the entire length of his right leg. He denies any numbness or weakness. No loss of bowel or bladder control. Patient has no history of IV drug use or cancer. Patient's right wrist with what appears to be a bug bite on his radial aspect of his wrist with adjacent erythema, swelling, and warmth to touch. There is no evidence of cellulitis at this time.  No signs of septic joint.  Will obtain plain films, basic labs, and UA.  Percocet given for pain.  Imaging negative for acute findings. Lab work with mild leukocytosis at 13.4. After Percocet, patient states his back pain has significantly improved. Back pain likely lumbar radiculopathy. No red flag symptoms or focal neurologic deficits on exam.  Patient will be discharged home with antibiotics for possible developing infection of right wrist as well as pain medication. He is to follow-up with his primary care physician.  Discussed plan with patient, he/she acknowledged understanding and agreed with plan of care.  Return precautions given for new or worsening symptoms.  Garlon Hatchet, PA-C 05/09/14 1512  Gilda Crease, MD 05/09/14 [phone]

## 2014-05-16 ENCOUNTER — Ambulatory Visit (INDEPENDENT_AMBULATORY_CARE_PROVIDER_SITE_OTHER): Payer: BLUE CROSS/BLUE SHIELD | Admitting: Family Medicine

## 2014-05-16 ENCOUNTER — Ambulatory Visit (INDEPENDENT_AMBULATORY_CARE_PROVIDER_SITE_OTHER): Payer: BLUE CROSS/BLUE SHIELD

## 2014-05-16 VITALS — BP 145/85 | HR 76 | Temp 98.6°F | Resp 18 | Wt 170.0 lb

## 2014-05-16 DIAGNOSIS — R05 Cough: Secondary | ICD-10-CM

## 2014-05-16 DIAGNOSIS — R059 Cough, unspecified: Secondary | ICD-10-CM

## 2014-05-16 DIAGNOSIS — M5441 Lumbago with sciatica, right side: Secondary | ICD-10-CM

## 2014-05-16 DIAGNOSIS — D72829 Elevated white blood cell count, unspecified: Secondary | ICD-10-CM

## 2014-05-16 LAB — POCT CBC
Granulocyte percent: 70.7 %G (ref 37–80)
HEMATOCRIT: 42.8 % — AB (ref 43.5–53.7)
Hemoglobin: 13.5 g/dL — AB (ref 14.1–18.1)
LYMPH, POC: 2.2 (ref 0.6–3.4)
MCH: 26.2 pg — AB (ref 27–31.2)
MCHC: 31.6 g/dL — AB (ref 31.8–35.4)
MCV: 83.1 fL (ref 80–97)
MID (CBC): 0.8 (ref 0–0.9)
MPV: 7.4 fL (ref 0–99.8)
POC Granulocyte: 7.2 — AB (ref 2–6.9)
POC LYMPH %: 21.6 % (ref 10–50)
POC MID %: 7.7 %M (ref 0–12)
Platelet Count, POC: 223 10*3/uL (ref 142–424)
RBC: 5.14 M/uL (ref 4.69–6.13)
RDW, POC: 13.7 %
WBC: 10.2 10*3/uL (ref 4.6–10.2)

## 2014-05-16 MED ORDER — AZITHROMYCIN 250 MG PO TABS: ORAL_TABLET | ORAL | Status: DC

## 2014-05-16 MED ORDER — OXYCODONE-ACETAMINOPHEN 5-325 MG PO TABS: 1.0000 | ORAL_TABLET | ORAL | Status: DC | PRN

## 2014-05-16 MED ORDER — ALBUTEROL SULFATE HFA 108 (90 BASE) MCG/ACT IN AERS: 1.0000 | INHALATION_SPRAY | RESPIRATORY_TRACT | Status: DC | PRN

## 2014-05-16 MED ORDER — PREDNISONE 20 MG PO TABS: ORAL_TABLET | ORAL | Status: DC

## 2014-05-16 NOTE — Progress Notes (Addendum)
Subjective:    Patient ID: Paul Edwards, male    DOB: 07-08-1955, 59 y.o.   MRN: [id number]  This chart was scribed initially for Shade FloodJeffrey R Ladarryl Wrage, MD, by Ronney LionSuzanne Le, ED Scribe. This patient was seen in room 14 and the patient's care was started at 5:25 PM.   Here with daughter - translating.   HPI  HPI Comments: Paul Edwards is a 59 y.o. male who presents to the [facility] for a hospital follow-up.   Patient is here for a follow-up. [facility]. He had arm pain and leg pain--Right wrist and hand pain, and right leg pain-- at that time. They noted a right wrist with an apparent bug bite and dorsal wrist swelling, but no abscess, no forearm or upper arm swelling. WBC with 13.4. He was treated with Percocet for probable lumbar radiculopathy, and antibiotics for possible right wrist infection.   R wrist and lumbar xrays no acute findings in ER.   He was started on Keflex 500 mg BID, Percocet 5/325 Q 4 hours #15.    Hand has improved. Min to no wrist pain, swelling resolved. No fever, redness resolved.   Back into leg pain has persisted. Pain down back of R buttocks down leg and stinging pain and numbness into top of foot. No bowel or bladder incontinence, no saddle anesthesia, no lower extremity weakness, just sore.  Taking percocet about 4 times per day, no relief. No change in pain in leg. Sx's there for 4 weeks - [facility]. Location managerMachine operator - seated work. Able to work with pain. Hx of LBP, but not to this degree for past 4 weeks.   some cough at night past 1 week. No fever.  Using nyquil at night.   Patient Active Problem List   Diagnosis Date Noted  . HYPERTENSION 05/12/2008  . Cough 05/12/2008   Past Medical History  Diagnosis Date  . Hypertension    No past surgical history on file. No Known Allergies Prior to Admission medications   Medication Sig Start Date End Date Taking? Authorizing Provider  amLODipine (NORVASC) 5 MG  tablet Take 1 tablet (5 mg total) by mouth daily. 04/05/12  Yes Elvina SidleKurt Lauenstein, MD  cephALEXin (KEFLEX) 500 MG capsule Take 1 capsule (500 mg total) by mouth 4 (four) times daily. 05/09/14  Yes Garlon HatchetLisa M Sanders, PA-C  oxyCODONE-acetaminophen (PERCOCET/ROXICET) 5-325 MG per tablet Take 1 tablet by mouth every 4 (four) hours as needed. 05/09/14  Yes Garlon HatchetLisa M Sanders, PA-C  pantoprazole (PROTONIX) 40 MG tablet Take 1 tablet (40 mg total) by mouth daily. 05/27/12  Yes Tonye Pearsonobert P Doolittle, MD  sucralfate (CARAFATE) 1 G tablet Take 1 tablet (1 g total) by mouth 4 (four) times daily. 1 hour ac and hs 06/10/12  Yes Carmelina DaneJeffery S Anderson, MD  butalbital-acetaminophen-caffeine [facility]) 50-325-40 MG per tablet Take 1 tablet by mouth every 6 (six) hours as needed for headache (headache). 05/27/12   Tonye Pearsonobert P Doolittle, MD   History   Social History  . Marital Status: Married    Spouse Name: N/A    Number of Children: N/A  . Years of Education: N/A   Occupational History  . Not on file.   Social History Main Topics  . Smoking status: Former Smoker -- 15 years  . Smokeless tobacco: Not on file  . Alcohol Use: No  . Drug Use: No  . Sexual Activity: Not on file   Other  Topics Concern  . Not on file   Social History Narrative      Review of Systems  Constitutional: Negative for fever and chills.  Respiratory: Positive for cough (at night for 1 week.) and wheezing (possible - noise heard by daughter with his breathing today. ). Negative for shortness of breath.   Cardiovascular: Negative for chest pain.  Gastrointestinal: Negative for abdominal pain.  Genitourinary: Negative for difficulty urinating.  Musculoskeletal: Positive for myalgias and back pain.  Skin: Negative for color change and rash.  Neurological: Negative for weakness.       No le weakness. No bowel/bladder incontinence, no saddle anesthesia.       Objective:   Physical Exam  Constitutional: He appears well-developed and well-nourished.    HENT:  Head: Normocephalic and atraumatic.  Neck: Normal range of motion.  Cardiovascular: Normal rate, regular rhythm, normal heart sounds and intact distal pulses.   Pulmonary/Chest: Effort normal. No respiratory distress. He has no decreased breath sounds. He has wheezes (faint end expiratory on Rll. ). He has rhonchi (few faint coarse breath sounds LLL. ) in the left lower field. He has no rales.  Abdominal: Soft. There is no tenderness.  Musculoskeletal: He exhibits tenderness (R sciatic notch. ).       Lumbar back: He exhibits decreased range of motion (decr ext and R lateral flexion. ), tenderness and spasm. He exhibits no bony tenderness.  Neurological: He is alert. He has normal strength. No sensory deficit. He displays no Babinski's sign on the right side. He displays no Babinski's sign on the left side.  Reflex Scores:      Patellar reflexes are 2+ on the right side and 2+ on the left side.      Achilles reflexes are 2+ on the right side and 2+ on the left side. Able to heel and toe walk without difficulty. Negative seated SLR.   Skin: Skin is warm and dry.  Psychiatric: He has a normal mood and affect. His behavior is normal.    Filed Vitals:   05/16/14 1711  BP: 145/85  Pulse: 76  Temp: 98.6 F (37 C)  TempSrc: Oral  Resp: 18  Weight: 170 lb (77.111 kg)  SpO2: 96%    [facility] reading (PRIMARY) by  Dr. Neva Seat: CXR: R hemidiaphragm elevation, increased RLL markings.   Results for orders placed or performed in visit on 05/16/14  POCT CBC  Result Value Ref Range   WBC 10.2 4.6 - 10.2 K/uL   Lymph, poc 2.2 0.6 - 3.4   POC LYMPH PERCENT 21.6 10 - 50 %L   MID (cbc) 0.8 0 - 0.9   POC MID % 7.7 0 - 12 %M   POC Granulocyte 7.2 (A) 2 - 6.9   Granulocyte percent 70.7 37 - 80 %G   RBC 5.14 4.69 - 6.13 M/uL   Hemoglobin 13.5 (A) 14.1 - 18.1 g/dL   HCT, POC 16.1 (A) 09.6 - 53.7 %   MCV 83.1 80 - 97 fL   MCH, POC 26.2 (A) 27 - 31.2 pg   MCHC 31.6 (A) 31.8 - 35.4 g/dL   RDW,  POC 04.5 %   Platelet Count, POC 223 142 - 424 K/uL   MPV 7.4 0 - 99.8 fL       Assessment & Plan:   Paul Edwards is a 59 y.o. male Cough - Plan: DG Chest 2 View, azithromycin (ZITHROMAX) 250 MG tablet, albuterol (PROVENTIL HFA;VENTOLIN HFA) 108 (90 BASE) MCG/ACT inhaler  -  bronchitis vs early CAP based on exam. Questionable markings on CXR, reassuring cbc.   -start Zpak, albuterol if needed for true wheezing, rtc precautions.   Leukocytosis - Plan: POCT CBC - now in normal range. rtc if fever or other acute illness.   Right-sided low back pain with right-sided sciatica - Plan: predniSONE (DELTASONE) 20 MG tablet, oxyCODONE-acetaminophen (PERCOCET/ROXICET) 5-325 MG per tablet  - suspected HNP/sciatica. Strength and reflexes ok.  Will try prednisone taper (SED), cont percocet if needed for pain and if not improving in 5-7 days, may need MRI or ortho eval at that time. RTC sooner if worsening.   Language barrier - dtr translating, understanding expressed.   Meds ordered this encounter  Medications  . predniSONE (DELTASONE) 20 MG tablet    Sig: 3 by mouth for 3 days, then 2 by mouth for 2 days, then 1 by mouth for 2 days, then 1/2 by mouth for 2 days.    Dispense:  16 tablet    Refill:  0  . azithromycin (ZITHROMAX) 250 MG tablet    Sig: Take 2 pills by mouth on day 1, then 1 pill by mouth per day on days 2 through 5.    Dispense:  6 tablet    Refill:  0  . albuterol (PROVENTIL HFA;VENTOLIN HFA) 108 (90 BASE) MCG/ACT inhaler    Sig: Inhale 1-2 puffs into the lungs every 4 (four) hours as needed for wheezing or shortness of breath.    Dispense:  1 Inhaler    Refill:  0  . oxyCODONE-acetaminophen (PERCOCET/ROXICET) 5-325 MG per tablet    Sig: Take 1 tablet by mouth every 4 (four) hours as needed.    Dispense:  30 tablet    Refill:  0   Patient Instructions  Start prednisone for possible herniated disc/sciatica. Percocet if needed for pain. If not improving in next 5-7 days - need to  order MRI of back or seen orthopaedic specialist. Return to the clinic or go to the nearest emergency room if any of your symptoms worsen or new symptoms occur.  Possible early pneumonia or infection in base of lungs. Start Zpak, albuterol only if needed for wheezing.  Return to the clinic or go to the nearest emergency room if any of your symptoms worsen or new symptoms occur.  Sciatica Sciatica is pain, weakness, numbness, or tingling along the path of the sciatic nerve. The nerve starts in the lower back and runs down the back of each leg. The nerve controls the muscles in the lower leg and in the back of the knee, while also providing sensation to the back of the thigh, lower leg, and the sole of your foot. Sciatica is a symptom of another medical condition. For instance, nerve damage or certain conditions, such as a herniated disk or bone spur on the spine, pinch or put pressure on the sciatic nerve. This causes the pain, weakness, or other sensations normally associated with sciatica. Generally, sciatica only affects one side of the body. CAUSES   Herniated or slipped disc.  Degenerative disk disease.  A pain disorder involving the narrow muscle in the buttocks (piriformis syndrome).  Pelvic injury or fracture.  Pregnancy.  Tumor (rare). SYMPTOMS  Symptoms can vary from mild to very severe. The symptoms usually travel from the low back to the buttocks and down the back of the leg. Symptoms can include:  Mild tingling or dull aches in the lower back, leg, or hip.  Numbness in the back of  the calf or sole of the foot.  Burning sensations in the lower back, leg, or hip.  Sharp pains in the lower back, leg, or hip.  Leg weakness.  Severe back pain inhibiting movement. These symptoms may get worse with coughing, sneezing, laughing, or prolonged sitting or standing. Also, being overweight may worsen symptoms. DIAGNOSIS  Your caregiver will perform a physical exam to look for  common symptoms of sciatica. He or she may ask you to do certain movements or activities that would trigger sciatic nerve pain. Other tests may be performed to find the cause of the sciatica. These may include:  Blood tests.  X-rays.  Imaging tests, such as an MRI or CT scan. TREATMENT  Treatment is directed at the cause of the sciatic pain. Sometimes, treatment is not necessary and the pain and discomfort goes away on its own. If treatment is needed, your caregiver may suggest:  Over-the-counter medicines to relieve pain.  Prescription medicines, such as anti-inflammatory medicine, muscle relaxants, or narcotics.  Applying heat or ice to the painful area.  Steroid injections to lessen pain, irritation, and inflammation around the nerve.  Reducing activity during periods of pain.  Exercising and stretching to strengthen your abdomen and improve flexibility of your spine. Your caregiver may suggest losing weight if the extra weight makes the back pain worse.  Physical therapy.  Surgery to eliminate what is pressing or pinching the nerve, such as a bone spur or part of a herniated disk. HOME CARE INSTRUCTIONS   Only take over-the-counter or prescription medicines for pain or discomfort as directed by your caregiver.  Apply ice to the affected area for 20 minutes, 3-4 times a day for the first 48-72 hours. Then try heat in the same way.  Exercise, stretch, or perform your usual activities if these do not aggravate your pain.  Attend physical therapy sessions as directed by your caregiver.  Keep all follow-up appointments as directed by your caregiver.  Do not wear high heels or shoes that do not provide proper support.  Check your mattress to see if it is too soft. A firm mattress may lessen your pain and discomfort. SEEK IMMEDIATE MEDICAL CARE IF:   You lose control of your bowel or bladder (incontinence).  You have increasing weakness in the lower back, pelvis, buttocks, or  legs.  You have redness or swelling of your back.  You have a burning sensation when you urinate.  You have pain that gets worse when you lie down or awakens you at night.  Your pain is worse than you have experienced in the past.  Your pain is lasting longer than 4 weeks.  You are suddenly losing weight without reason. MAKE SURE YOU:  Understand these instructions.  Will watch your condition.  Will get help right away if you are not doing well or get worse. Document Released: 03/22/2001 Document Revised: 09/27/2011 Document Reviewed: 08/07/2011 [facility] Patient Information 2015 [place], [place]. This information is not intended to replace advice given to you by your health care provider. Make sure you discuss any questions you have with your health care provider.  Cough, Adult  A cough is a reflex that helps clear your throat and airways. It can help heal the body or may be a reaction to an irritated airway. A cough may only last 2 or 3 weeks (acute) or may last more than 8 weeks (chronic).  CAUSES Acute cough:  Viral or bacterial infections. Chronic cough:  Infections.  Allergies.  Asthma.  Post-nasal drip.  Smoking.  Heartburn or acid reflux.  Some medicines.  Chronic lung problems (COPD).  Cancer. SYMPTOMS   Cough.  Fever.  Chest pain.  Increased breathing rate.  High-pitched whistling sound when breathing (wheezing).  Colored mucus that you cough up (sputum). TREATMENT   A bacterial cough may be treated with antibiotic medicine.  A viral cough must run its course and will not respond to antibiotics.  Your caregiver may recommend other treatments if you have a chronic cough. HOME CARE INSTRUCTIONS   Only take over-the-counter or prescription medicines for pain, discomfort, or fever as directed by your caregiver. Use cough suppressants only as directed by your caregiver.  Use a cold steam vaporizer or humidifier in your bedroom or home to help  loosen secretions.  Sleep in a semi-upright position if your cough is worse at night.  Rest as needed.  Stop smoking if you smoke. SEEK IMMEDIATE MEDICAL CARE IF:   You have pus in your sputum.  Your cough starts to worsen.  You cannot control your cough with suppressants and are losing sleep.  You begin coughing up blood.  You have difficulty breathing.  You develop pain which is getting worse or is uncontrolled with medicine.  You have a fever. MAKE SURE YOU:   Understand these instructions.  Will watch your condition.  Will get help right away if you are not doing well or get worse. Document Released: 09/24/2010 Document Revised: 06/20/2011 Document Reviewed: 09/24/2010 [facility] Patient Information 2015 [place], [place]. This information is not intended to replace advice given to you by your health care provider. Make sure you discuss any questions you have with your health care provider.     I personally performed the services described in this documentation, which was scribed in my presence. The recorded information has been reviewed and considered, and addended by me as needed.

## 2014-05-16 NOTE — Patient Instructions (Signed)
Start prednisone for possible herniated disc/sciatica. Percocet if needed for pain. If not improving in next 5-7 days - need to order MRI of back or seen orthopaedic specialist. Return to the clinic or go to the nearest emergency room if any of your symptoms worsen or new symptoms occur.  Possible early pneumonia or infection in base of lungs. Start Zpak, albuterol only if needed for wheezing.  Return to the clinic or go to the nearest emergency room if any of your symptoms worsen or new symptoms occur.  Sciatica Sciatica is pain, weakness, numbness, or tingling along the path of the sciatic nerve. The nerve starts in the lower back and runs down the back of each leg. The nerve controls the muscles in the lower leg and in the back of the knee, while also providing sensation to the back of the thigh, lower leg, and the sole of your foot. Sciatica is a symptom of another medical condition. For instance, nerve damage or certain conditions, such as a herniated disk or bone spur on the spine, pinch or put pressure on the sciatic nerve. This causes the pain, weakness, or other sensations normally associated with sciatica. Generally, sciatica only affects one side of the body. CAUSES   Herniated or slipped disc.  Degenerative disk disease.  A pain disorder involving the narrow muscle in the buttocks (piriformis syndrome).  Pelvic injury or fracture.  Pregnancy.  Tumor (rare). SYMPTOMS  Symptoms can vary from mild to very severe. The symptoms usually travel from the low back to the buttocks and down the back of the leg. Symptoms can include:  Mild tingling or dull aches in the lower back, leg, or hip.  Numbness in the back of the calf or sole of the foot.  Burning sensations in the lower back, leg, or hip.  Sharp pains in the lower back, leg, or hip.  Leg weakness.  Severe back pain inhibiting movement. These symptoms may get worse with coughing, sneezing, laughing, or prolonged sitting or  standing. Also, being overweight may worsen symptoms. DIAGNOSIS  Your caregiver will perform a physical exam to look for common symptoms of sciatica. He or she may ask you to do certain movements or activities that would trigger sciatic nerve pain. Other tests may be performed to find the cause of the sciatica. These may include:  Blood tests.  X-rays.  Imaging tests, such as an MRI or CT scan. TREATMENT  Treatment is directed at the cause of the sciatic pain. Sometimes, treatment is not necessary and the pain and discomfort goes away on its own. If treatment is needed, your caregiver may suggest:  Over-the-counter medicines to relieve pain.  Prescription medicines, such as anti-inflammatory medicine, muscle relaxants, or narcotics.  Applying heat or ice to the painful area.  Steroid injections to lessen pain, irritation, and inflammation around the nerve.  Reducing activity during periods of pain.  Exercising and stretching to strengthen your abdomen and improve flexibility of your spine. Your caregiver may suggest losing weight if the extra weight makes the back pain worse.  Physical therapy.  Surgery to eliminate what is pressing or pinching the nerve, such as a bone spur or part of a herniated disk. HOME CARE INSTRUCTIONS   Only take over-the-counter or prescription medicines for pain or discomfort as directed by your caregiver.  Apply ice to the affected area for 20 minutes, 3-4 times a day for the first 48-72 hours. Then try heat in the same way.  Exercise, stretch, or perform  your usual activities if these do not aggravate your pain.  Attend physical therapy sessions as directed by your caregiver.  Keep all follow-up appointments as directed by your caregiver.  Do not wear high heels or shoes that do not provide proper support.  Check your mattress to see if it is too soft. A firm mattress may lessen your pain and discomfort. SEEK IMMEDIATE MEDICAL CARE IF:   You  lose control of your bowel or bladder (incontinence).  You have increasing weakness in the lower back, pelvis, buttocks, or legs.  You have redness or swelling of your back.  You have a burning sensation when you urinate.  You have pain that gets worse when you lie down or awakens you at night.  Your pain is worse than you have experienced in the past.  Your pain is lasting longer than 4 weeks.  You are suddenly losing weight without reason. MAKE SURE YOU:  Understand these instructions.  Will watch your condition.  Will get help right away if you are not doing well or get worse. Document Released: 03/22/2001 Document Revised: 09/27/2011 Document Reviewed: 08/07/2011 [facility] Patient Information 2015 [place]. This information is not intended to replace advice given to you by your health care provider. Make sure you discuss any questions you have with your health care provider.  Cough, Adult  A cough is a reflex that helps clear your throat and airways. It can help heal the body or may be a reaction to an irritated airway. A cough may only last 2 or 3 weeks (acute) or may last more than 8 weeks (chronic).  CAUSES Acute cough:  Viral or bacterial infections. Chronic cough:  Infections.  Allergies.  Asthma.  Post-nasal drip.  Smoking.  Heartburn or acid reflux.  Some medicines.  Chronic lung problems (COPD).  Cancer. SYMPTOMS   Cough.  Fever.  Chest pain.  Increased breathing rate.  High-pitched whistling sound when breathing (wheezing).  Colored mucus that you cough up (sputum). TREATMENT   A bacterial cough may be treated with antibiotic medicine.  A viral cough must run its course and will not respond to antibiotics.  Your caregiver may recommend other treatments if you have a chronic cough. HOME CARE INSTRUCTIONS   Only take over-the-counter or prescription medicines for pain, discomfort, or fever as directed by your caregiver. Use  cough suppressants only as directed by your caregiver.  Use a cold steam vaporizer or humidifier in your bedroom or home to help loosen secretions.  Sleep in a semi-upright position if your cough is worse at night.  Rest as needed.  Stop smoking if you smoke. SEEK IMMEDIATE MEDICAL CARE IF:   You have pus in your sputum.  Your cough starts to worsen.  You cannot control your cough with suppressants and are losing sleep.  You begin coughing up blood.  You have difficulty breathing.  You develop pain which is getting worse or is uncontrolled with medicine.  You have a fever. MAKE SURE YOU:   Understand these instructions.  Will watch your condition.  Will get help right away if you are not doing well or get worse. Document Released: 09/24/2010 Document Revised: 06/20/2011 Document Reviewed: 09/24/2010 [facility] Patient Information 2015 [place], [place]. This information is not intended to replace advice given to you by your health care provider. Make sure you discuss any questions you have with your health care provider.

## 2015-03-11 ENCOUNTER — Ambulatory Visit (INDEPENDENT_AMBULATORY_CARE_PROVIDER_SITE_OTHER): Payer: BLUE CROSS/BLUE SHIELD | Admitting: Physician Assistant

## 2015-03-11 VITALS — BP 144/88 | HR 67 | Temp 98.2°F | Resp 16 | Ht 67.0 in | Wt 175.0 lb

## 2015-03-11 DIAGNOSIS — J209 Acute bronchitis, unspecified: Secondary | ICD-10-CM

## 2015-03-11 DIAGNOSIS — R062 Wheezing: Secondary | ICD-10-CM | POA: Diagnosis not present

## 2015-03-11 LAB — POCT CBC
GRANULOCYTE PERCENT: 58 % (ref 37–80)
HCT, POC: 43 % — AB (ref 43.5–53.7)
Hemoglobin: 13.9 g/dL — AB (ref 14.1–18.1)
Lymph, poc: 2.8 (ref 0.6–3.4)
MCH: 25.4 pg — AB (ref 27–31.2)
MCHC: 32.3 g/dL (ref 31.8–35.4)
MCV: 78.5 fL — AB (ref 80–97)
MID (cbc): 0.6 (ref 0–0.9)
MPV: 8.1 fL (ref 0–99.8)
PLATELET COUNT, POC: 159 10*3/uL (ref 142–424)
POC GRANULOCYTE: 4.6 (ref 2–6.9)
POC LYMPH PERCENT: 34.5 %L (ref 10–50)
POC MID %: 7.5 %M (ref 0–12)
RBC: 5.48 M/uL (ref 4.69–6.13)
RDW, POC: 13.2 %
WBC: 8 10*3/uL (ref 4.6–10.2)

## 2015-03-11 LAB — GLUCOSE, POCT (MANUAL RESULT ENTRY): POC Glucose: 100 mg/dl — AB (ref 70–99)

## 2015-03-11 MED ORDER — ALBUTEROL SULFATE (2.5 MG/3ML) 0.083% IN NEBU
2.5000 mg | INHALATION_SOLUTION | Freq: Once | RESPIRATORY_TRACT | Status: AC
  Administered 2015-03-11: 2.5 mg via RESPIRATORY_TRACT

## 2015-03-11 MED ORDER — HYDROCOD POLST-CPM POLST ER 10-8 MG/5ML PO SUER
5.0000 mL | Freq: Two times a day (BID) | ORAL | Status: DC | PRN
Start: 2015-03-11 — End: 2016-05-28

## 2015-03-11 MED ORDER — IPRATROPIUM BROMIDE 0.02 % IN SOLN
0.5000 mg | Freq: Once | RESPIRATORY_TRACT | Status: AC
  Administered 2015-03-11: 0.5 mg via RESPIRATORY_TRACT

## 2015-03-11 MED ORDER — AZITHROMYCIN 250 MG PO TABS: ORAL_TABLET | ORAL | Status: AC

## 2015-03-11 MED ORDER — ALBUTEROL SULFATE HFA 108 (90 BASE) MCG/ACT IN AERS: 2.0000 | INHALATION_SPRAY | RESPIRATORY_TRACT | Status: DC | PRN

## 2015-03-11 MED ORDER — PREDNISONE 20 MG PO TABS: ORAL_TABLET | ORAL | Status: DC

## 2015-03-11 NOTE — Patient Instructions (Addendum)
Cough syrup for sleep Antibiotic for cough Albuterol every 4 hours as needed for wheezing Prednisone for wheezing. Return in 1 week if symptoms not improving.

## 2015-03-11 NOTE — Progress Notes (Signed)
[facility] [address], [place] [id number] [phone] [facility]299- 0000  Date:  03/11/2015   Name:  Paul LowensteinYblol Million   DOB:  May 29, 1955   MRN:  [id number]  PCP:  Carmelina DaneAnderson, Jeffery S, MD    Chief Complaint: chest congestion and Cough   History of Present Illness:  This is a 59 y.o. male with PMH HTN who is presenting with cough and congestion x 3 days.   Cough: cough is dry although he feels he needs to cough something up SOB/wheezing: wheezing, esp at night when he lays down. He had wheezing 1 year ago with cough and was rx'd albuterol. Otherwise no problems with wheezing and no hx asthma. Nasal congestion: no Otalgia: no Sore throat: no Fever/chills: no Aggravating/alleviating factors: has taken tylenol pm to help him sleep History of env allergies: no Tobacco use: no  Review of Systems:  Review of Systems See HPI  Patient Active Problem List   Diagnosis Date Noted  . HYPERTENSION 05/12/2008  . Cough 05/12/2008    Prior to Admission medications   Not on File    No Known Allergies  History reviewed. No pertinent past surgical history.  Social History  Substance Use Topics  . Smoking status: Former Smoker -- 15 years  . Smokeless tobacco: Never Used  . Alcohol Use: No    History reviewed. No pertinent family history.  Medication list has been reviewed and updated.  Physical Examination:  Physical Exam  Constitutional: He is oriented to person, place, and time. He appears well-developed and well-nourished. No distress.  HENT:  Head: Normocephalic and atraumatic.  Right Ear: Hearing, tympanic membrane, external ear and ear canal normal.  Left Ear: Hearing, tympanic membrane, external ear and ear canal normal.  Nose: Nose normal.  Mouth/Throat: Uvula is midline and mucous membranes are normal. Posterior oropharyngeal erythema present. No oropharyngeal exudate or posterior oropharyngeal edema.  Eyes: Conjunctivae and lids are normal. Right eye  exhibits no discharge. Left eye exhibits no discharge. No scleral icterus.  Cardiovascular: Normal rate, regular rhythm, normal heart sounds and normal pulses.   No murmur heard. Pulmonary/Chest: Effort normal. No respiratory distress. He has wheezes (diffuse). He has rhonchi (diffuse). He has no rales.  2 rounds of duoneb with improvement in wheezing. Still with scattered wheezes and rales heard at bilateral bases L>R  Musculoskeletal: Normal range of motion.  Lymphadenopathy:       Head (right side): No submental, no submandibular and no tonsillar adenopathy present.       Head (left side): No submental, no submandibular and no tonsillar adenopathy present.    He has no cervical adenopathy.  Neurological: He is alert and oriented to person, place, and time.  Skin: Skin is warm, dry and intact. No lesion and no rash noted.  Psychiatric: He has a normal mood and affect. His speech is normal and behavior is normal. Thought content normal.   BP 144/88 mmHg  Pulse 67  Temp(Src) 98.2 F (36.8 C) (Oral)  Resp 16  Ht 5\' 7"  (1.702 m)  Wt 175 lb (79.379 kg)  BMI 27.40 kg/m2  SpO2 98%  Results for orders placed or performed in visit on 03/11/15  POCT glucose (manual entry)  Result Value Ref Range   POC Glucose 100 (A) 70 - 99 mg/dl  POCT CBC  Result Value Ref Range   WBC 8.0 4.6 - 10.2 K/uL   Lymph, poc 2.8 0.6 - 3.4   POC LYMPH PERCENT 34.5 10 -  50 %L   MID (cbc) 0.6 0 - 0.9   POC MID % 7.5 0 - 12 %M   POC Granulocyte 4.6 2 - 6.9   Granulocyte percent 58.0 37 - 80 %G   RBC 5.48 4.69 - 6.13 M/uL   Hemoglobin 13.9 (A) 14.1 - 18.1 g/dL   HCT, POC 16.1 (A) 09.6 - 53.7 %   MCV 78.5 (A) 80 - 97 fL   MCH, POC 25.4 (A) 27 - 31.2 pg   MCHC 32.3 31.8 - 35.4 g/dL   RDW, POC 04.5 %   Platelet Count, POC 159 142 - 424 K/uL   MPV 8.1 0 - 99.8 fL   Assessment and Plan:  1. Acute bronchitis, unspecified organism 2. wheezing Vitals and cbc normal however d/t rales on pulm exam, will treat  with zpak. Albuterol and prednisone for wheezing. tussionex for sleep. Return in 1 week if symptoms not improved. Advised he return at earliest convenience for CPE. - chlorpheniramine-HYDROcodone (TUSSIONEX PENNKINETIC ER) 10-8 MG/5ML SUER; Take 5 mLs by mouth every 12 (twelve) hours as needed for cough.  Dispense: 100 mL; Refill: 0 - predniSONE (DELTASONE) 20 MG tablet; Take 3 PO QAM x3days, 2 PO QAM x3days, 1 PO QAM x3days  Dispense: 18 tablet; Refill: 0 - azithromycin (ZITHROMAX) 250 MG tablet; Take 2 tabs PO x 1 dose, then 1 tab PO QD x 4 days  Dispense: 6 tablet; Refill: 0 - albuterol (PROVENTIL) (2.5 MG/3ML) 0.083% nebulizer solution 2.5 mg; Take 3 mLs (2.5 mg total) by nebulization once. - ipratropium (ATROVENT) nebulizer solution 0.5 mg; Take 2.5 mLs (0.5 mg total) by nebulization once. - POCT glucose (manual entry) - POCT CBC - albuterol (PROVENTIL HFA;VENTOLIN HFA) 108 (90 BASE) MCG/ACT inhaler; Inhale 2 puffs into the lungs every 4 (four) hours as needed for wheezing or shortness of breath (cough, shortness of breath or wheezing.).  Dispense: 1 Inhaler; Refill: 1   Icey Tello V. Dyke Brackett, MHS [facility]  03/11/2015

## 2015-04-07 ENCOUNTER — Ambulatory Visit (INDEPENDENT_AMBULATORY_CARE_PROVIDER_SITE_OTHER): Payer: BLUE CROSS/BLUE SHIELD | Admitting: Family Medicine

## 2015-04-07 VITALS — BP 150/90 | HR 75 | Temp 98.1°F | Resp 18 | Ht 67.0 in | Wt 171.0 lb

## 2015-04-07 DIAGNOSIS — J4541 Moderate persistent asthma with (acute) exacerbation: Secondary | ICD-10-CM

## 2015-04-07 MED ORDER — METHYLPREDNISOLONE 4 MG PO TBPK: ORAL_TABLET | ORAL | Status: DC

## 2015-04-07 MED ORDER — ALBUTEROL SULFATE (2.5 MG/3ML) 0.083% IN NEBU
2.5000 mg | INHALATION_SOLUTION | Freq: Once | RESPIRATORY_TRACT | Status: AC
  Administered 2015-04-07: 2.5 mg via RESPIRATORY_TRACT

## 2015-04-07 MED ORDER — METHYLPREDNISOLONE ACETATE 80 MG/ML IJ SUSP
120.0000 mg | Freq: Once | INTRAMUSCULAR | Status: AC
  Administered 2015-04-07: 120 mg via INTRAMUSCULAR

## 2015-04-07 MED ORDER — IPRATROPIUM BROMIDE 0.02 % IN SOLN
0.5000 mg | Freq: Once | RESPIRATORY_TRACT | Status: AC
  Administered 2015-04-07: 0.5 mg via RESPIRATORY_TRACT

## 2015-04-07 NOTE — Patient Instructions (Signed)
Please return in 3 weeks for recheck. 

## 2015-04-07 NOTE — Progress Notes (Signed)
By signing my name below, I, Stann Oresung-Kai Tsai, attest that this documentation has been prepared under the direction and in the presence of Elvina SidleKurt Phallon Haydu, MD. Electronically Signed: Stann Oresung-Kai Tsai, Scribe. 04/07/2015 , 9:48 AM .  Patient was seen in room 1 .   Patient ID: Paul Edwards MRN: [id number], DOB: August 02, 1955, 59 y.o. Date of Encounter: 04/07/2015  Primary Physician: Carmelina DaneAnderson, Jeffery S, MD  Chief Complaint:  Chief Complaint  Patient presents with  . Cough    Unable to sleep  . Shortness of Breath    Since last time he was here    HPI:  Paul Edwards is a 59 y.o. male who presents to [facility] complaining of cough that started a month ago. He notes that it went away but it returned and symptoms worsened. He's been having shortness of breath and wheezing with the cough. He also reports losing sleep due to the coughs.   He works at [employer].   Past Medical History  Diagnosis Date  . Hypertension      Home Meds: Prior to Admission medications   Medication Sig Start Date End Date Taking? Authorizing Provider  albuterol (PROVENTIL HFA;VENTOLIN HFA) 108 (90 BASE) MCG/ACT inhaler Inhale 2 puffs into the lungs every 4 (four) hours as needed for wheezing or shortness of breath (cough, shortness of breath or wheezing.). 03/11/15  Yes Lanier ClamNicole Bush V, PA-C  chlorpheniramine-HYDROcodone (TUSSIONEX PENNKINETIC ER) 10-8 MG/5ML SUER Take 5 mLs by mouth every 12 (twelve) hours as needed for cough. Patient not taking: Reported on 04/07/2015 03/11/15   Dorna LeitzNicole Bush V, PA-C    Allergies: No Known Allergies  Social History   Social History  . Marital Status: Married    Spouse Name: N/A  . Number of Children: N/A  . Years of Education: N/A   Occupational History  . Not on file.   Social History Main Topics  . Smoking status: Former Smoker -- 15 years  . Smokeless tobacco: Never Used  . Alcohol Use: No  . Drug Use: No  . Sexual Activity: Not on file     Other Topics Concern  . Not on file   Social History Narrative     Review of Systems: Constitutional: negative for fever, chills, night sweats, weight changes, or fatigue  HEENT: negative for vision changes, hearing loss, congestion, rhinorrhea, ST, epistaxis, or sinus pressure Cardiovascular: negative for chest pain or palpitations Respiratory: negative for hemoptysis, wheezing, shortness of breath; positive for cough, wheezing, shortness of breath Abdominal: negative for abdominal pain, nausea, vomiting, diarrhea, or constipation Dermatological: negative for rash Neurologic: negative for headache, dizziness, or syncope All other systems reviewed and are otherwise negative with the exception to those above and in the HPI.  Physical Exam: Blood pressure 150/90, pulse 75, temperature 98.1 F (36.7 C), temperature source Oral, resp. rate 18, height 5\' 7"  (1.702 m), weight 171 lb (77.565 kg), SpO2 94 %., Body mass index is 26.78 kg/(m^2). General: Well developed, well nourished, in no acute distress. Head: Normocephalic, atraumatic, eyes without discharge, sclera non-icteric, nares are without discharge. Bilateral auditory canals clear, TM's are without perforation, pearly grey and translucent with reflective cone of light bilaterally. Oral cavity moist, posterior pharynx without exudate, erythema, peritonsillar abscess, or post nasal drip.  Neck: Supple. No thyromegaly. Full ROM. No lymphadenopathy. Lungs: Clear bilaterally to auscultation without rales, or rhonchi. Breathing is unlabored. Wheezing throughout lungs Heart: RRR with S1 S2. No murmurs, rubs, or gallops appreciated. Msk:  Strength  and tone normal for age. Extremities/Skin: Warm and dry. No clubbing or cyanosis. No edema. No rashes or suspicious lesions. Neuro: Alert and oriented X 3. Moves all extremities spontaneously. Gait is normal. CNII-XII grossly in tact. Psych:  Responds to questions appropriately with a normal  affect.   Improved after breathing treatment but still wheezing  ASSESSMENT AND PLAN:  59 y.o. year old male with asthma This chart was scribed in my presence and reviewed by me personally.    ICD-9-CM ICD-10-CM   1. Asthma with acute exacerbation, moderate persistent 493.92 J45.41 albuterol (PROVENTIL) (2.5 MG/3ML) 0.083% nebulizer solution 2.5 mg     ipratropium (ATROVENT) nebulizer solution 0.5 mg     methylPREDNISolone acetate (DEPO-MEDROL) injection 120 mg     methylPREDNISolone (MEDROL DOSEPAK) 4 MG TBPK tablet    Recheck 3 week   Signed, Elvina Sidle, MD 04/07/2015 9:48 AM

## 2016-05-28 ENCOUNTER — Encounter: Payer: Self-pay | Admitting: Family Medicine

## 2016-05-28 ENCOUNTER — Ambulatory Visit (INDEPENDENT_AMBULATORY_CARE_PROVIDER_SITE_OTHER): Payer: BLUE CROSS/BLUE SHIELD | Admitting: Family Medicine

## 2016-05-28 VITALS — BP 150/90

## 2016-05-28 DIAGNOSIS — Z114 Encounter for screening for human immunodeficiency virus [HIV]: Secondary | ICD-10-CM | POA: Diagnosis not present

## 2016-05-28 DIAGNOSIS — K219 Gastro-esophageal reflux disease without esophagitis: Secondary | ICD-10-CM

## 2016-05-28 DIAGNOSIS — I10 Essential (primary) hypertension: Secondary | ICD-10-CM | POA: Diagnosis not present

## 2016-05-28 DIAGNOSIS — Z1159 Encounter for screening for other viral diseases: Secondary | ICD-10-CM

## 2016-05-28 LAB — POCT CBC
GRANULOCYTE PERCENT: 68 % (ref 37–80)
HEMATOCRIT: 40.5 % — AB (ref 43.5–53.7)
Hemoglobin: 13.9 g/dL — AB (ref 14.1–18.1)
Lymph, poc: 1.5 (ref 0.6–3.4)
MCH, POC: 26.8 pg — AB (ref 27–31.2)
MCHC: 34.3 g/dL (ref 31.8–35.4)
MCV: 78.2 fL — AB (ref 80–97)
MID (CBC): 0.7 (ref 0–0.9)
MPV: 8.1 fL (ref 0–99.8)
PLATELET COUNT, POC: 162 10*3/uL (ref 142–424)
POC Granulocyte: 4.8 (ref 2–6.9)
POC LYMPH %: 21.5 % (ref 10–50)
POC MID %: 10.5 % (ref 0–12)
RBC: 5.18 M/uL (ref 4.69–6.13)
RDW, POC: 13.3 %
WBC: 7.1 10*3/uL (ref 4.6–10.2)

## 2016-05-28 LAB — POCT URINALYSIS DIP (MANUAL ENTRY)
BILIRUBIN UA: NEGATIVE
Blood, UA: NEGATIVE
GLUCOSE UA: NEGATIVE
Ketones, POC UA: NEGATIVE
Leukocytes, UA: NEGATIVE
Nitrite, UA: NEGATIVE
Protein Ur, POC: NEGATIVE
SPEC GRAV UA: 1.02
UROBILINOGEN UA: 0.2
pH, UA: 5.5

## 2016-05-28 MED ORDER — RANITIDINE HCL 150 MG PO TABS: 150.0000 mg | ORAL_TABLET | Freq: Every day | ORAL | 0 refills | Status: DC

## 2016-05-28 MED ORDER — PANTOPRAZOLE SODIUM 40 MG PO TBEC: 40.0000 mg | DELAYED_RELEASE_TABLET | Freq: Every day | ORAL | 3 refills | Status: DC

## 2016-05-28 MED ORDER — AMLODIPINE BESYLATE 2.5 MG PO TABS: 2.5000 mg | ORAL_TABLET | Freq: Every day | ORAL | 1 refills | Status: DC

## 2016-05-28 NOTE — Patient Instructions (Addendum)
IF you received an x-ray today, you will receive an invoice from [facility]. Please contact [facility] at [phone] with questions or concerns regarding your invoice.   IF you received labwork today, you will receive an invoice from [place]. Please contact [facility] at [phone] with questions or concerns regarding your invoice.   Our billing staff will not be able to assist you with questions regarding bills from these companies.  You will be contacted with the lab results as soon as they are available. The fastest way to get your results is to activate your My Chart account. Instructions are located on the last page of this paperwork. If you have not heard from [place] regarding the results in 2 weeks, please contact this office.    ? nng (Heartburn) ? nng l m?t lo?i ?au hay kh ch?u c th? x?y ra trong c? h?ng ho?c ng?c. N th??ng ???c m t? nh? m?t c?n ?au rt. N c?ng c th? gy ra m?t vi? ???ng trong mi?ng. ? nng c th? c?m th?y t?i t? h?n khi quy? vi? n?m xu?ng ho?c ci xu?ng, v n th??ng n?ng h?n vo ban ?m. ? nng c th? xa?y ra do ca?c th?? trong d? dy di chuy?n ng???c ln th?c qu?n (tra?o ng???c). H??NG D?N CH?M [place] T?I NH Th?c hi?n nh??ng hnh ??ng na?y ?? gi?m kh ch?u v gip trnh cc bi?n ch?ng. Ch? ?? ?n u?ng   Tun th? m?t ch? ?? ?n theo khuy?n ngh? c?a chuyn gia ch?m [place] s?c kh?e. Vi?c ny c th? l trnh cc th?c ?n v ?? u?ng nh?:  C ph v tr (c ho?c khng c caffeine).  ?? u?ng c ch?a r??u.  ?? u?ng t?ng l?c v ?? u?ng dng trong th? thao.  ?? u?ng c ga ho?c soda.  S c la v c ca.  B?c h v h??ng v? b?c h.  T?i v hnh.  C?i ng?a (Horseradish).  Cc th?c ?n nhi?u gia v? v a xt, bao g?m h?t tiu, b?t ?t, b?t ca ri, gi?m, n??c s?t cay, v n??c s?t barbecue.  N??c qu? ho?c qu? h? cam qut, ch?ng h?n nh? cam, chanh v chanh l cam.  Cc th?c ?n c c chua, nh? n??c x?t ??, ?t, n??c x?t salsa, v pizza km x?t  ??[name]  Th?c ?n chin v nhi?u ch?t bo, ch?ng h?n nh? bnh rn, khoai ty chin, khoai ty rn v n??c x?t nhi?u ch?t bo.  Th?t nhi?u ch?t bo, ch?ng h?n nh? hot dog (bnh m k?p xc xch) v cc lo?i th?t ?? v tr?ng nhi?u m?, ch?ng h?n nh? th?t n?c l?ng, xc xch, gi?m bng v th?t l?n xng khi.  Nh?ng s?n ph?m b? s?a giu ch?t bo, nh? s?a nguyn kem, b? v pho mt kem.  ?n cc b?a nh?, th??ng xuyn thay v cc b?a no.  Trnh u?ng nhi?u n??c khi qu v? ?n.  Trnh ?n trong kho?ng 2-3 gi? tr??c khi ?i ng?.  Trnh n?m xu?ng ngay sau khi ?n.  Khng t?p th? d?c ngay sau khi ?n. H??ng d?n chung   Ch  ??n nh?ng thay ??i v? tri?u ch?ng c?a qu v?.  Ch? s? d?ng thu?c khng c?n k ??n v thu?c c?n k ??n theo ch? d?n c?a chuyn gia ch?m [place] s?c kh?e. Khng dng aspirin, ibuprofen, ho?c cc thu?c NSAID khc tr? khi chuyn gia ch?m [place] s?c kh?e c?a qu v? ba?o quy? vi? du?ng.  Khng s?  d?ng b?t k? s?n ph?m thu?c l no, bao g?m thu?c l d?ng ht, thu?c l d?ng nhai v thu?c l ?i?n t?. N?u qu v? c?n gip ?? ?? cai thu?c, hy h?i chuyn gia ch?m [place] s?c kh?e.  M?c qu?n o r?ng. Khng m?c ci g ch?t quanh eo m c th? t?o p l?c ln b?ng quy? vi?.  Nng cao (nng) ??u gi??ng c?a quy? vi? kho?ng 6 inch (15 cm).  C? g?ng gi?m c?ng th?ng, ch?ng h?n nh? t?p yoga ho?c thi?n. N?u qu v? c?n gip ?? ?? gi?m c?ng th?ng, hy h?i chuyn gia ch?m [place] s?c kh?e.  N?u qu v? th?a cn, hy gi?m cn n?ng v? m?c c l?i cho s?c kh?e c?a qu v?. Hy h?i chuyn gia ch?m [place] s?c kh?e ?? ???c h??ng d?n v? m?c tiu gi?m cn an ton.  Tun th? t?t c? cc cu?c h?n khm l?i theo  ki?n c?a chuyn gia ch?m [place] s?c kh?e. ?i?u ny c vai tr quan tr?ng. ?I KHM N?U:  Qu v? c cc tri?u ch?ng m?i.  Qu v? b? s?t cn khng r nguyn nhn.  Qu v? b? kh nu?t ho?c b? ?au khi nu?t.  Qu v? th? kh kh ho?c ho dai d?ng.  Cc tri?u ch?ng c?a qu v? khng c?i thi?n sau khi ???c ?i?u tr?Anselmo Rod.  Quy? vi? bi? ? nng  th??ng xuyn trong h?n hai tu?n. NGAY L?P T?C ?I KHM N?U:  Qu v? b? ?au ? cnh tay, c?, hm, r?ng ho?c l?ng.  Qu v? th?y ?? m? hi, chng m?t ho?c chong vng.  Qu v? b? ?au ng?c ho?c th? d?c.  Qu v? nn v ch?t nn ra gi?ng nh? mu ho?c b c ph.  Phn c?a qu v? c mu ho?c mu ?en. Thng tin ny khng nh?m m?c ?ch thay th? cho l?i khuyn m chuyn gia ch?m [place] s?c kh?e ni v?i qu v?. Hy b?o ??m qu v? ph?i th?o lu?n b?t k? v?n ?? g m qu v? c v?i chuyn gia ch?m [place] s?c kh?e c?a qu v?. Document Released: 07/20/2015 Document Revised: 07/20/2015 Document Reviewed: 07/23/2014 Elsevier Interactive Patient Education  2017 [employer]

## 2016-05-28 NOTE — Progress Notes (Signed)
Subjective:    Patient ID: Paul Edwards, male    DOB: [birth year], 61 y.o.   MRN: [id number]  05/28/2016  Heartburn (C/O reflux with certain foods)   HPI This 61 y.o. male presents for evaluation of heartburn with recent worsening.  Onset one year.  Has been getting worse.  Eating soup, donut; will worsening and regurgutation.  Can only eat rice.  Worsening for three weeks.  Regurgitation/vomiting three times per day.  Ate breakfast, ate egg, rice.  No coffee.  Stopped coffee three months ago.  No abdominla pain.  No diarrhea.  No constipation.  No bloody stools. No hematemisis.  Sour taste in mouth.  Small amount of regurg; sour tasting. No medication from store. Has taken a lot of motrin for headaches every day.  Has daily headache.  This morning, woke up 3:00am and eyes.    Ran out of blood pressure medication three months ago.     Review of Systems  Constitutional: Negative for activity change, appetite change, chills, diaphoresis, fatigue and fever.  Respiratory: Negative for cough and shortness of breath.   Cardiovascular: Negative for chest pain, palpitations and leg swelling.  Gastrointestinal: Positive for vomiting. Negative for abdominal pain, anal bleeding, blood in stool, constipation, diarrhea, nausea and rectal pain.  Endocrine: Negative for cold intolerance, heat intolerance, polydipsia, polyphagia and polyuria.  Skin: Negative for color change, rash and wound.  Neurological: Negative for dizziness, tremors, seizures, syncope, facial asymmetry, speech difficulty, weakness, light-headedness, numbness and headaches.  Psychiatric/Behavioral: Negative for dysphoric mood and sleep disturbance. The patient is not nervous/anxious.     Past Medical History:  Diagnosis Date  . Hypertension    History reviewed. No pertinent surgical history. No Known Allergies  Social History   Social History  . Marital status: Married    Spouse name: N/A  . Number of children: N/A  . Years  of education: N/A   Occupational History  . Not on file.   Social History Main Topics  . Smoking status: Former Smoker    Years: 15.00  . Smokeless tobacco: Never Used  . Alcohol use No  . Drug use: No  . Sexual activity: Not on file   Other Topics Concern  . Not on file   Social History Narrative  . No narrative on file   History reviewed. No pertinent family history.     Objective:    BP (!) 150/90  Physical Exam  Constitutional: He is oriented to person, place, and time. He appears well-developed and well-nourished. No distress.  HENT:  Head: Normocephalic and atraumatic.  Right Ear: External ear normal.  Left Ear: External ear normal.  Nose: Nose normal.  Mouth/Throat: Oropharynx is clear and moist.  Eyes: Conjunctivae and EOM are normal. Pupils are equal, round, and reactive to light.  Neck: Normal range of motion. Neck supple. Carotid bruit is not present. No thyromegaly present.  Cardiovascular: Normal rate, regular rhythm, normal heart sounds and intact distal pulses.  Exam reveals no gallop and no friction rub.   No murmur heard. Pulmonary/Chest: Effort normal and breath sounds normal. He has no wheezes. He has no rales.  Abdominal: Soft. Bowel sounds are normal. He exhibits no distension and no mass. There is no tenderness. There is no rebound and no guarding.  Lymphadenopathy:    He has no cervical adenopathy.  Neurological: He is alert and oriented to person, place, and time. No cranial nerve deficit. He exhibits normal muscle tone. Coordination normal.  Skin: Skin  is warm and dry. No rash noted. He is not diaphoretic.  Psychiatric: He has a normal mood and affect. His behavior is normal.  Nursing note and vitals reviewed.  Results for orders placed or performed in visit on 05/28/16  POCT CBC  Result Value Ref Range   WBC 7.1 4.6 - 10.2 K/uL   Lymph, poc 1.5 0.6 - 3.4   POC LYMPH PERCENT 21.5 10 - 50 %L   MID (cbc) 0.7 0 - 0.9   POC MID % 10.5 0 - 12  %M   POC Granulocyte 4.8 2 - 6.9   Granulocyte percent 68.0 37 - 80 %G   RBC 5.18 4.69 - 6.13 M/uL   Hemoglobin 13.9 (A) 14.1 - 18.1 g/dL   HCT, POC 16.140.5 (A) 09.643.5 - 53.7 %   MCV 78.2 (A) 80 - 97 fL   MCH, POC 26.8 (A) 27 - 31.2 pg   MCHC 34.3 31.8 - 35.4 g/dL   RDW, POC 04.513.3 %   Platelet Count, POC 162 142 - 424 K/uL   MPV 8.1 0 - 99.8 fL  POCT urinalysis dipstick  Result Value Ref Range   Color, UA yellow yellow   Clarity, UA clear clear   Glucose, UA negative negative   Bilirubin, UA negative negative   Ketones, POC UA negative negative   Spec Grav, UA 1.020    Blood, UA negative negative   pH, UA 5.5    Protein Ur, POC negative negative   Urobilinogen, UA 0.2    Nitrite, UA Negative Negative   Leukocytes, UA Negative Negative       Assessment & Plan:   1. Gastroesophageal reflux disease without esophagitis   2. Essential hypertension   3. Encounter for hepatitis C screening test for low risk patient   4. Screening for HIV (human immunodeficiency virus)    -New; recurrent GERD symptoms; rx for Protonix 40mg  daily; rx for Zantac to take qhs for one month. -refill of Amlodipine 2.5mg  daily provided.   -obtain age appropriate screening labs.   Orders Placed This Encounter  Procedures  . Comprehensive metabolic panel  . HIV antibody  . Hepatitis C antibody  . Care order/instruction:    Please recheck BP.  [name]. POCT CBC  . POCT urinalysis dipstick  . EKG 12-Lead   Meds ordered this encounter  Medications  . ADVAIR DISKUS 100-50 MCG/DOSE AEPB    Refill:  0  . amLODipine (NORVASC) 2.5 MG tablet    Sig: Take 1 tablet (2.5 mg total) by mouth daily.    Dispense:  90 tablet    Refill:  1  . pantoprazole (PROTONIX) 40 MG tablet    Sig: Take 1 tablet (40 mg total) by mouth daily.    Dispense:  30 tablet    Refill:  3  . ranitidine (ZANTAC) 150 MG tablet    Sig: Take 1 tablet (150 mg total) by mouth at bedtime.    Dispense:  30 tablet    Refill:  0    No  Follow-up on file.   Kristi Paulita FujitaMartin Smith, M.D. [facility]  [place] [facility] [address] [place], [place]  [id number] [phone] phone [phone] fax

## 2016-05-29 LAB — COMPREHENSIVE METABOLIC PANEL
A/G RATIO: 1.5 (ref 1.2–2.2)
ALT: 36 IU/L (ref 0–44)
AST: 22 IU/L (ref 0–40)
Albumin: 4.6 g/dL (ref 3.6–4.8)
Alkaline Phosphatase: 66 IU/L (ref 39–117)
BUN/Creatinine Ratio: 17 (ref 10–24)
BUN: 17 mg/dL (ref 8–27)
Bilirubin Total: 0.4 mg/dL (ref 0.0–1.2)
CALCIUM: 9.2 mg/dL (ref 8.6–10.2)
CHLORIDE: 102 mmol/L (ref 96–106)
CO2: 23 mmol/L (ref 18–29)
Creatinine, Ser: 1.03 mg/dL (ref 0.76–1.27)
GFR, EST AFRICAN AMERICAN: 90 mL/min/{1.73_m2} (ref >59)
GFR, EST NON AFRICAN AMERICAN: 78 mL/min/{1.73_m2} (ref >59)
GLOBULIN, TOTAL: 3.1 g/dL (ref 1.5–4.5)
Glucose: 96 mg/dL (ref 65–99)
POTASSIUM: 4.4 mmol/L (ref 3.5–5.2)
Sodium: 143 mmol/L (ref 134–144)
TOTAL PROTEIN: 7.7 g/dL (ref 6.0–8.5)

## 2016-05-29 LAB — HIV ANTIBODY (ROUTINE TESTING W REFLEX): HIV SCREEN 4TH GENERATION: NONREACTIVE

## 2016-05-29 LAB — HEPATITIS C ANTIBODY: Hep C Virus Ab: <0.1 s/co ratio (ref 0.0–0.9)

## 2016-10-10 ENCOUNTER — Ambulatory Visit (INDEPENDENT_AMBULATORY_CARE_PROVIDER_SITE_OTHER): Payer: BLUE CROSS/BLUE SHIELD | Admitting: Family Medicine

## 2016-10-10 ENCOUNTER — Ambulatory Visit (INDEPENDENT_AMBULATORY_CARE_PROVIDER_SITE_OTHER): Payer: BLUE CROSS/BLUE SHIELD

## 2016-10-10 ENCOUNTER — Encounter: Payer: Self-pay | Admitting: Family Medicine

## 2016-10-10 VITALS — BP 136/82 | HR 69 | Temp 98.0°F | Resp 18 | Ht 68.11 in | Wt 171.0 lb

## 2016-10-10 DIAGNOSIS — K219 Gastro-esophageal reflux disease without esophagitis: Secondary | ICD-10-CM

## 2016-10-10 DIAGNOSIS — Z13220 Encounter for screening for lipoid disorders: Secondary | ICD-10-CM | POA: Diagnosis not present

## 2016-10-10 DIAGNOSIS — R202 Paresthesia of skin: Secondary | ICD-10-CM | POA: Diagnosis not present

## 2016-10-10 DIAGNOSIS — M25512 Pain in left shoulder: Secondary | ICD-10-CM

## 2016-10-10 DIAGNOSIS — R1110 Vomiting, unspecified: Secondary | ICD-10-CM

## 2016-10-10 DIAGNOSIS — E039 Hypothyroidism, unspecified: Secondary | ICD-10-CM

## 2016-10-10 DIAGNOSIS — I10 Essential (primary) hypertension: Secondary | ICD-10-CM | POA: Diagnosis not present

## 2016-10-10 DIAGNOSIS — M4802 Spinal stenosis, cervical region: Secondary | ICD-10-CM | POA: Diagnosis not present

## 2016-10-10 LAB — POCT URINALYSIS DIP (MANUAL ENTRY)
BILIRUBIN UA: NEGATIVE
BILIRUBIN UA: NEGATIVE mg/dL
GLUCOSE UA: NEGATIVE mg/dL
Leukocytes, UA: NEGATIVE
Nitrite, UA: NEGATIVE
PH UA: 5 (ref 5.0–8.0)
Protein Ur, POC: NEGATIVE mg/dL
SPEC GRAV UA: 1.02 (ref 1.010–1.025)
Urobilinogen, UA: 0.2 E.U./dL

## 2016-10-10 MED ORDER — PANTOPRAZOLE SODIUM 40 MG PO TBEC: 40.0000 mg | DELAYED_RELEASE_TABLET | Freq: Every day | ORAL | 3 refills | Status: DC

## 2016-10-10 MED ORDER — PREDNISONE 20 MG PO TABS: ORAL_TABLET | ORAL | 0 refills | Status: DC

## 2016-10-10 MED ORDER — AMLODIPINE BESYLATE 2.5 MG PO TABS: 2.5000 mg | ORAL_TABLET | Freq: Every day | ORAL | 1 refills | Status: DC

## 2016-10-10 MED ORDER — GABAPENTIN 300 MG PO CAPS: 300.0000 mg | ORAL_CAPSULE | Freq: Every day | ORAL | 0 refills | Status: DC

## 2016-10-10 MED ORDER — RANITIDINE HCL 150 MG PO TABS: 150.0000 mg | ORAL_TABLET | Freq: Every day | ORAL | 1 refills | Status: DC

## 2016-10-10 NOTE — Patient Instructions (Signed)
     IF you received an x-ray today, you will receive an invoice from [facility]. Please contact [facility] at [phone] with questions or concerns regarding your invoice.   IF you received labwork today, you will receive an invoice from [facility]. Please contact [facility] at [phone] with questions or concerns regarding your invoice.   Our billing staff will not be able to assist you with questions regarding bills from these companies.  You will be contacted with the lab results as soon as they are available. The fastest way to get your results is to activate your My Chart account. Instructions are located on the last page of this paperwork. If you have not heard from us regarding the results in 2 weeks, please contact this office.     

## 2016-10-10 NOTE — Progress Notes (Signed)
Subjective:    Patient ID: Paul Edwards, male    DOB: 05/22/1955, 61 y.o.   MRN: [id number]  10/10/2016  Numbness (left are on/off for the past month.  )   HPI This 61 y.o. male presents with daughter for evaluation of L arm numbness.  Onset in past month.  NO injury.  No associated LLE numbness; no weakness in L arm or L leg.  No facial numbness or weakness.  Has intermittent neck pain.  No headache; no vision changes.  No rash associated with numbness.  No slurred speech or dysphagia.  Postprandial vomiting: onset four years ago.  Continues despite two different gastroenterology consultations.  Symptoms greatly improve with PPI as prescribed at last visit but must continue PPI.  If non-compliance with PPI, symptoms immediately return.Consultation with [facility]er in 07/2006.  S/p abdominal US 2008: negative.   Review of Systems  Constitutional: Negative for activity change, appetite change, chills, diaphoresis, fatigue and fever.  Respiratory: Negative for cough and shortness of breath.   Cardiovascular: Negative for chest pain, palpitations and leg swelling.  Gastrointestinal: Negative for abdominal pain, diarrhea, nausea and vomiting.  Endocrine: Negative for cold intolerance, heat intolerance, polydipsia, polyphagia and polyuria.  Musculoskeletal: Positive for neck pain and neck stiffness.  Skin: Negative for color change, rash and wound.  Neurological: Positive for numbness. Negative for dizziness, tremors, seizures, syncope, facial asymmetry, speech difficulty, weakness, light-headedness and headaches.  Psychiatric/Behavioral: Negative for dysphoric mood and sleep disturbance. The patient is not nervous/anxious.     Past Medical History:  Diagnosis Date  . Hypertension    History reviewed. No pertinent surgical history. No Known Allergies  Social History   Social History  . Marital status: Married    Spouse name: N/A  . Number of children: N/A  . Years of education: N/A    Occupational History  . Not on file.   Social History Main Topics  . Smoking status: Former Smoker    Years: 15.00  . Smokeless tobacco: Never Used  . Alcohol use No  . Drug use: No  . Sexual activity: Not on file   Other Topics Concern  . Not on file   Social History Narrative  . No narrative on file   History reviewed. No pertinent family history.     Objective:    BP 136/82   Pulse 69   Temp 98 F (36.7 C) (Oral)   Resp 18   Ht 5' 8.11" (1.73 m)   Wt 171 lb (77.6 kg)   SpO2 98%   BMI 25.92 kg/m  Physical Exam  Constitutional: He is oriented to person, place, and time. He appears well-developed and well-nourished. No distress.  HENT:  Head: Normocephalic and atraumatic.  Right Ear: External ear normal.  Left Ear: External ear normal.  Nose: Nose normal.  Mouth/Throat: Oropharynx is clear and moist.  Eyes: Conjunctivae and EOM are normal. Pupils are equal, round, and reactive to light.  Neck: Normal range of motion. Neck supple. Carotid bruit is not present. No thyromegaly present.  Cardiovascular: Normal rate, regular rhythm, normal heart sounds and intact distal pulses.  Exam reveals no gallop and no friction rub.   No murmur heard. Pulses:      Radial pulses are 2+ on the right side, and 2+ on the left side.  Pulmonary/Chest: Effort normal and breath sounds normal. He has no wheezes. He has no rales.  Abdominal: Soft. Bowel sounds are normal. He exhibits no distension and no mass. There  is no tenderness. There is no rebound and no guarding.  Musculoskeletal:       Left shoulder: He exhibits pain. He exhibits no tenderness, no bony tenderness, no spasm, normal pulse and normal strength.       Left elbow: Normal.       Left wrist: Normal.       Cervical back: He exhibits pain and spasm. He exhibits normal range of motion, no tenderness, no bony tenderness and normal pulse.  Lymphadenopathy:    He has no cervical adenopathy.  Neurological: He is alert and  oriented to person, place, and time. He has normal reflexes. No cranial nerve deficit. He exhibits normal muscle tone. Coordination normal.  Skin: Skin is warm and dry. No rash noted. He is not diaphoretic.  Psychiatric: He has a normal mood and affect. His behavior is normal.  Nursing note and vitals reviewed.  Depression screen [facility] 2/9 10/10/2016 05/28/2016 04/07/2015 03/11/2015  Decreased Interest 0 0 0 0  Down, Depressed, Hopeless 0 0 0 0  PHQ - 2 Score 0 0 0 0   Fall Risk  10/10/2016 05/28/2016  Falls in the past year? No No        Assessment & Plan:   1. Acute pain of left shoulder   2. Paresthesias in left hand   3. Postprandial vomiting   4. Gastroesophageal reflux disease without esophagitis   5. Acquired hypothyroidism   6. Essential hypertension   7. Screening, lipid    -New onset L arm paresthesias associated with L shoulder pain and neck pain; obtain films of cervical spine and L shoudler; treat with Prednisone and Gabapentin; close follow-up; if no improvement, refer to ortho and order MRI cervical spine.  Neurologically intact; no suggestion of CVA; pt and daughter advised and reassured. -chronic post-prandial vomiting; pt reporting two separate GI consultations in the past; improvement with chronic PPI therapy; obtain labs; review previous gastroenterology consultation notes.  Continue current medications.   Orders Placed This Encounter  Procedures  . DG Cervical Spine Complete    Standing Status:   Future    Number of Occurrences:   1    Standing Expiration Date:   10/10/2017    Order Specific Question:   Reason for Exam (SYMPTOM  OR DIAGNOSIS REQUIRED)    Answer:   L arm numbness and pain    Order Specific Question:   Preferred imaging location?    Answer:   External  . DG Shoulder Left    Standing Status:   Future    Number of Occurrences:   1    Standing Expiration Date:   10/10/2017    Order Specific Question:   Reason for Exam (SYMPTOM  OR DIAGNOSIS REQUIRED)     Answer:   L arm numbness and pain    Order Specific Question:   Preferred imaging location?    Answer:   External  . CBC with Differential/Platelet  . Comprehensive metabolic panel    Order Specific Question:   Has the patient fasted?    Answer:   Yes  . TSH  . T4, free  . Lipid panel    Order Specific Question:   Has the patient fasted?    Answer:   Yes  . POCT urinalysis dipstick  . EKG 12-Lead   Meds ordered this encounter  Medications  . amLODipine (NORVASC) 2.5 MG tablet    Sig: Take 1 tablet (2.5 mg total) by mouth daily.    Dispense:  90 tablet    Refill:  1  . pantoprazole (PROTONIX) 40 MG tablet    Sig: Take 1 tablet (40 mg total) by mouth daily.    Dispense:  90 tablet    Refill:  3  . ranitidine (ZANTAC) 150 MG tablet    Sig: Take 1 tablet (150 mg total) by mouth at bedtime.    Dispense:  90 tablet    Refill:  1  . predniSONE (DELTASONE) 20 MG tablet    Sig: Two tablets daily x 5 days then one tablet daily x 5 days    Dispense:  15 tablet    Refill:  0  . gabapentin (NEURONTIN) 300 MG capsule    Sig: Take 1 capsule (300 mg total) by mouth at bedtime.    Dispense:  30 capsule    Refill:  0    Return in about 4 weeks (around 11/07/2016) for recheck numbness, throwing up.   Kristi Paulita FujitaMartin Smith, M.D. Primary Care at [facility]  [place] [facility] [address] [place], [place]  [id number] [phone] phone [phone] fax

## 2016-10-11 LAB — COMPREHENSIVE METABOLIC PANEL
ALT: 39 IU/L (ref 0–44)
AST: 41 IU/L — AB (ref 0–40)
Albumin/Globulin Ratio: 1.5 (ref 1.2–2.2)
Albumin: 4.9 g/dL — ABNORMAL HIGH (ref 3.6–4.8)
Alkaline Phosphatase: 104 IU/L (ref 39–117)
BILIRUBIN TOTAL: 0.3 mg/dL (ref 0.0–1.2)
BUN/Creatinine Ratio: 18 (ref 10–24)
BUN: 19 mg/dL (ref 8–27)
CHLORIDE: 99 mmol/L (ref 96–106)
CO2: 24 mmol/L (ref 20–29)
Calcium: 9.5 mg/dL (ref 8.6–10.2)
Creatinine, Ser: 1.07 mg/dL (ref 0.76–1.27)
GFR calc Af Amer: 86 mL/min/{1.73_m2} (ref >59)
GFR calc non Af Amer: 75 mL/min/{1.73_m2} (ref >59)
GLOBULIN, TOTAL: 3.2 g/dL (ref 1.5–4.5)
Glucose: 96 mg/dL (ref 65–99)
Potassium: 4.3 mmol/L (ref 3.5–5.2)
SODIUM: 140 mmol/L (ref 134–144)
Total Protein: 8.1 g/dL (ref 6.0–8.5)

## 2016-10-11 LAB — CBC WITH DIFFERENTIAL/PLATELET
Basophils Absolute: 0.1 10*3/uL (ref 0.0–0.2)
Basos: 1 %
EOS (ABSOLUTE): 0.6 10*3/uL — ABNORMAL HIGH (ref 0.0–0.4)
Eos: 7 %
Hematocrit: 43.3 % (ref 37.5–51.0)
Hemoglobin: 14.1 g/dL (ref 13.0–17.7)
Immature Grans (Abs): 0 10*3/uL (ref 0.0–0.1)
Immature Granulocytes: 0 %
LYMPHS ABS: 1.9 10*3/uL (ref 0.7–3.1)
Lymphs: 22 %
MCH: 25.5 pg — AB (ref 26.6–33.0)
MCHC: 32.6 g/dL (ref 31.5–35.7)
MCV: 78 fL — AB (ref 79–97)
MONOS ABS: 0.7 10*3/uL (ref 0.1–0.9)
Monocytes: 8 %
NEUTROS ABS: 5.2 10*3/uL (ref 1.4–7.0)
NEUTROS PCT: 62 %
PLATELETS: 196 10*3/uL (ref 150–379)
RBC: 5.54 x10E6/uL (ref 4.14–5.80)
RDW: 13.9 % (ref 12.3–15.4)
WBC: 8.5 10*3/uL (ref 3.4–10.8)

## 2016-10-11 LAB — LIPID PANEL
CHOLESTEROL TOTAL: 164 mg/dL (ref 100–199)
Chol/HDL Ratio: 5.3 ratio — ABNORMAL HIGH (ref 0.0–5.0)
HDL: 31 mg/dL — ABNORMAL LOW (ref >39)
Triglycerides: 806 mg/dL (ref 0–149)

## 2016-10-11 LAB — TSH: TSH: 4.37 u[IU]/mL (ref 0.450–4.500)

## 2016-10-11 LAB — T4, FREE: FREE T4: 1.02 ng/dL (ref 0.82–1.77)

## 2016-10-28 ENCOUNTER — Telehealth: Payer: Self-pay | Admitting: Family Medicine

## 2016-11-15 ENCOUNTER — Ambulatory Visit: Payer: BLUE CROSS/BLUE SHIELD | Admitting: Family Medicine

## 2016-11-16 ENCOUNTER — Encounter: Payer: Self-pay | Admitting: Family Medicine

## 2016-11-16 ENCOUNTER — Ambulatory Visit (INDEPENDENT_AMBULATORY_CARE_PROVIDER_SITE_OTHER): Payer: BLUE CROSS/BLUE SHIELD | Admitting: Family Medicine

## 2016-11-16 VITALS — BP 138/84 | HR 63 | Temp 98.0°F | Resp 18 | Ht 68.11 in | Wt 175.0 lb

## 2016-11-16 DIAGNOSIS — R1110 Vomiting, unspecified: Secondary | ICD-10-CM | POA: Diagnosis not present

## 2016-11-16 DIAGNOSIS — K219 Gastro-esophageal reflux disease without esophagitis: Secondary | ICD-10-CM | POA: Diagnosis not present

## 2016-11-16 DIAGNOSIS — M5030 Other cervical disc degeneration, unspecified cervical region: Secondary | ICD-10-CM | POA: Diagnosis not present

## 2016-11-16 DIAGNOSIS — R202 Paresthesia of skin: Secondary | ICD-10-CM | POA: Diagnosis not present

## 2016-11-16 NOTE — Patient Instructions (Signed)
     IF you received an x-ray today, you will receive an invoice from [facility]. Please contact [facility] at [phone] with questions or concerns regarding your invoice.   IF you received labwork today, you will receive an invoice from [facility]. Please contact [facility] at [phone] with questions or concerns regarding your invoice.   Our billing staff will not be able to assist you with questions regarding bills from these companies.  You will be contacted with the lab results as soon as they are available. The fastest way to get your results is to activate your My Chart account. Instructions are located on the last page of this paperwork. If you have not heard from us regarding the results in 2 weeks, please contact this office.     

## 2016-12-05 DIAGNOSIS — K219 Gastro-esophageal reflux disease without esophagitis: Secondary | ICD-10-CM | POA: Insufficient documentation

## 2016-12-05 DIAGNOSIS — M5030 Other cervical disc degeneration, unspecified cervical region: Secondary | ICD-10-CM | POA: Insufficient documentation

## 2016-12-05 NOTE — Progress Notes (Signed)
Subjective:    Patient ID: Paul Edwards, male    DOB: 1955-11-30, 61 y.o.   MRN: [id number]  11/16/2016  Arm Pain (left arm pain 1 month follow-up)   HPI This 61 y.o. male presents for evaluation of L shoulder/arm pain with numbness/tingling/weakness and postprandial vomiting.  Management changes made at lats visit included obtaining films of cervical spine and L shoudler; treat with Prednisone and Gabapentin; close follow-up; if no improvement, refer to ortho and order MRI cervical spine.  Neurologically intact; no suggestion of CVA; pt and daughter advised and reassured. -chronic post-prandial vomiting; pt reporting two separate GI consultations in the past; improvement with chronic PPI therapy; obtain labs; review previous gastroenterology consultation notes.  Continue current medications.   No improvement from last visit; numbness and tingling recurred immediately after stopping Prednisone.  Underwent Lumbar surgery in 2014 by [facility]; also underwent three injections by Dr. Darcel Bayley prior to surgery.    Post-prandial vomiting resolved with restarting PPI and H2 blocker.  No recurrence.  Pleased with results.  EXAM: CERVICAL SPINE - COMPLETE 4+ VIEW COMPARISON:  None. FINDINGS: There is no evidence of cervical spine fracture or prevertebral soft tissue swelling. There is straightening of cervical spine. There are degenerative joint changes with narrowed joint space and osteophyte formation of the C3, C4, C5 and C6 more prominently involving C4-5. There are narrowing of the bilateral C3-4 -C4-5 neural foramina due to osteophyte encroachment. IMPRESSION: Osteoarthritic changes of cervical spine. Electronically Signed   By: Sherian Rein M.D.   On: 10/10/2016 18:12  EXAM: LEFT SHOULDER - 2+ VIEW COMPARISON:  May 16, 2014 chest x-ray. FINDINGS: There is no evidence of fracture or dislocation. There is no evidence of arthropathy or other focal bone abnormality.  Soft tissues are unremarkable. IMPRESSION: Negative. Electronically Signed   By: Sherian Rein M.D.   On: 10/10/2016 18:12   BP Readings from Last 3 Encounters:  11/16/16 138/84  10/10/16 136/82  05/28/16 (!) 150/90   Wt Readings from Last 3 Encounters:  11/16/16 175 lb (79.4 kg)  10/10/16 171 lb (77.6 kg)  04/07/15 171 lb (77.6 kg)   Immunization History  Administered Date(s) Administered  . Influenza-Unspecified 02/10/2016    Review of Systems  Constitutional: Negative for activity change, appetite change, chills, diaphoresis, fatigue and fever.  Respiratory: Negative for cough and shortness of breath.   Cardiovascular: Negative for chest pain, palpitations and leg swelling.  Gastrointestinal: Negative for abdominal distention, abdominal pain, anal bleeding, blood in stool, constipation, diarrhea, nausea, rectal pain and vomiting.  Endocrine: Negative for cold intolerance, heat intolerance, polydipsia, polyphagia and polyuria.  Musculoskeletal: Positive for arthralgias, neck pain and neck stiffness.  Skin: Negative for color change, rash and wound.  Neurological: Positive for numbness. Negative for dizziness, tremors, seizures, syncope, facial asymmetry, speech difficulty, weakness, light-headedness and headaches.  Psychiatric/Behavioral: Negative for dysphoric mood and sleep disturbance. The patient is not nervous/anxious.     Past Medical History:  Diagnosis Date  . Hypertension    History reviewed. No pertinent surgical history. No Known Allergies  Social History   Social History  . Marital status: Married    Spouse name: N/A  . Number of children: N/A  . Years of education: N/A   Occupational History  . Not on file.   Social History Main Topics  . Smoking status: Former Smoker    Years: 15.00  . Smokeless tobacco: Never Used  . Alcohol use No  . Drug use: No  .  Sexual activity: Not on file   Other Topics Concern  . Not on file   Social History  Narrative  . No narrative on file   History reviewed. No pertinent family history.     Objective:    BP 138/84   Pulse 63   Temp 98 F (36.7 C) (Oral)   Resp 18   Ht 5' 8.11" (1.73 m)   Wt 175 lb (79.4 kg)   SpO2 98%   BMI 26.52 kg/m  Physical Exam  Constitutional: He is oriented to person, place, and time. He appears well-developed and well-nourished. No distress.  HENT:  Head: Normocephalic and atraumatic.  Right Ear: External ear normal.  Left Ear: External ear normal.  Nose: Nose normal.  Mouth/Throat: Oropharynx is clear and moist.  Eyes: Pupils are equal, round, and reactive to light. Conjunctivae and EOM are normal.  Neck: Normal range of motion. Neck supple. Carotid bruit is not present. No thyromegaly present.  Cardiovascular: Normal rate, regular rhythm, normal heart sounds and intact distal pulses.  Exam reveals no gallop and no friction rub.   No murmur heard. Pulmonary/Chest: Effort normal and breath sounds normal. He has no wheezes. He has no rales.  Abdominal: Soft. Bowel sounds are normal. He exhibits no distension and no mass. There is no tenderness. There is no rebound and no guarding.  Musculoskeletal:       Right shoulder: Normal.       Left shoulder: Normal.       Left elbow: Normal.       Left wrist: Normal.       Cervical back: He exhibits decreased range of motion, pain and spasm. He exhibits no tenderness, no bony tenderness and normal pulse.  Lymphadenopathy:    He has no cervical adenopathy.  Neurological: He is alert and oriented to person, place, and time. No cranial nerve deficit.  Skin: Skin is warm and dry. No rash noted. He is not diaphoretic.  Psychiatric: He has a normal mood and affect. His behavior is normal.  Nursing note and vitals reviewed.  Results for orders placed or performed in visit on 10/10/16  CBC with Differential/Platelet  Result Value Ref Range   WBC 8.5 3.4 - 10.8 x10E3/uL   RBC 5.54 4.14 - 5.80 x10E6/uL    Hemoglobin 14.1 13.0 - 17.7 g/dL   Hematocrit 06.2 69.4 - 51.0 %   MCV 78 (L) 79 - 97 fL   MCH 25.5 (L) 26.6 - 33.0 pg   MCHC 32.6 31.5 - 35.7 g/dL   RDW 85.4 62.7 - 03.5 %   Platelets 196 150 - 379 x10E3/uL   Neutrophils 62 Not Estab. %   Lymphs 22 Not Estab. %   Monocytes 8 Not Estab. %   Eos 7 Not Estab. %   Basos 1 Not Estab. %   Neutrophils Absolute 5.2 1.4 - 7.0 x10E3/uL   Lymphocytes Absolute 1.9 0.7 - 3.1 x10E3/uL   Monocytes Absolute 0.7 0.1 - 0.9 x10E3/uL   EOS (ABSOLUTE) 0.6 (H) 0.0 - 0.4 x10E3/uL   Basophils Absolute 0.1 0.0 - 0.2 x10E3/uL   Immature Granulocytes 0 Not Estab. %   Immature Grans (Abs) 0.0 0.0 - 0.1 x10E3/uL  Comprehensive metabolic panel  Result Value Ref Range   Glucose 96 65 - 99 mg/dL   BUN 19 8 - 27 mg/dL   Creatinine, Ser 0.09 0.76 - 1.27 mg/dL   GFR calc non Af Amer 75 >59 mL/min/1.73   GFR calc Af Denyse Dago  86 >59 mL/min/1.73   BUN/Creatinine Ratio 18 10 - 24   Sodium 140 134 - 144 mmol/L   Potassium 4.3 3.5 - 5.2 mmol/L   Chloride 99 96 - 106 mmol/L   CO2 24 20 - 29 mmol/L   Calcium 9.5 8.6 - 10.2 mg/dL   Total Protein 8.1 6.0 - 8.5 g/dL   Albumin 4.9 (H) 3.6 - 4.8 g/dL   Globulin, Total 3.2 1.5 - 4.5 g/dL   Albumin/Globulin Ratio 1.5 1.2 - 2.2   Bilirubin Total 0.3 0.0 - 1.2 mg/dL   Alkaline Phosphatase 104 39 - 117 IU/L   AST 41 (H) 0 - 40 IU/L   ALT 39 0 - 44 IU/L  TSH  Result Value Ref Range   TSH 4.370 0.450 - 4.500 uIU/mL  T4, free  Result Value Ref Range   Free T4 1.02 0.82 - 1.77 ng/dL  Lipid panel  Result Value Ref Range   Cholesterol, Total 164 100 - 199 mg/dL   Triglycerides 161 (HH) 0 - 149 mg/dL   HDL 31 (L) >09 mg/dL   VLDL Cholesterol Cal Comment 5 - 40 mg/dL   LDL Calculated Comment 0 - 99 mg/dL   Chol/HDL Ratio 5.3 (H) 0.0 - 5.0 ratio  POCT urinalysis dipstick  Result Value Ref Range   Color, UA yellow yellow   Clarity, UA clear clear   Glucose, UA negative negative mg/dL   Bilirubin, UA negative negative    Ketones, POC UA negative negative mg/dL   Spec Grav, UA 6.045 4.098 - 1.025   Blood, UA trace-intact (A) negative   pH, UA 5.0 5.0 - 8.0   Protein Ur, POC negative negative mg/dL   Urobilinogen, UA 0.2 0.2 or 1.0 E.U./dL   Nitrite, UA Negative Negative   Leukocytes, UA Negative Negative   No results found. Depression screen [facility] 2/9 11/16/2016 10/10/2016 05/28/2016 04/07/2015 03/11/2015  Decreased Interest 0 0 0 0 0  Down, Depressed, Hopeless 0 0 0 0 0  PHQ - 2 Score 0 0 0 0 0   Fall Risk  11/16/2016 10/10/2016 05/28/2016  Falls in the past year? No No No    No results found.     Assessment & Plan:   1. Degenerative disc disease, cervical   2. Gastroesophageal reflux disease without esophagitis   3. Postprandial vomiting   4. Paresthesias in left hand    -persistent L arm paresthesias with neck pain s/p prednisone taper and gabapentin. S/p cervical spine films and LEFT shoulder films at last visit that revealed DDD cervical spine.  Refer to ortho for further evaluation. -postprandial vomiting resolved with Protonix and Zantac.  Obtain records from Dr. Elnoria Howard of GI.   No orders of the defined types were placed in this encounter.  No orders of the defined types were placed in this encounter.   No Follow-up on file.   Perrin Gens Paulita Fujita, M.D. Primary Care at [facility] previously [facility] [address] [place], [place]  [address] [phone] phone [phone] fax

## 2017-01-04 ENCOUNTER — Encounter: Payer: Self-pay | Admitting: Family Medicine

## 2017-01-04 ENCOUNTER — Ambulatory Visit (INDEPENDENT_AMBULATORY_CARE_PROVIDER_SITE_OTHER): Payer: BLUE CROSS/BLUE SHIELD | Admitting: Family Medicine

## 2017-01-04 VITALS — BP 122/80 | HR 64 | Temp 98.5°F | Resp 16 | Ht 68.5 in | Wt 173.0 lb

## 2017-01-04 DIAGNOSIS — Z1211 Encounter for screening for malignant neoplasm of colon: Secondary | ICD-10-CM | POA: Diagnosis not present

## 2017-01-04 DIAGNOSIS — Z23 Encounter for immunization: Secondary | ICD-10-CM

## 2017-01-04 DIAGNOSIS — I10 Essential (primary) hypertension: Secondary | ICD-10-CM

## 2017-01-04 DIAGNOSIS — Z125 Encounter for screening for malignant neoplasm of prostate: Secondary | ICD-10-CM | POA: Diagnosis not present

## 2017-01-04 DIAGNOSIS — K219 Gastro-esophageal reflux disease without esophagitis: Secondary | ICD-10-CM | POA: Diagnosis not present

## 2017-01-04 DIAGNOSIS — M5030 Other cervical disc degeneration, unspecified cervical region: Secondary | ICD-10-CM | POA: Diagnosis not present

## 2017-01-04 DIAGNOSIS — N401 Enlarged prostate with lower urinary tract symptoms: Secondary | ICD-10-CM

## 2017-01-04 DIAGNOSIS — Z131 Encounter for screening for diabetes mellitus: Secondary | ICD-10-CM | POA: Diagnosis not present

## 2017-01-04 DIAGNOSIS — Z0000 Encounter for general adult medical examination without abnormal findings: Secondary | ICD-10-CM

## 2017-01-04 DIAGNOSIS — R351 Nocturia: Secondary | ICD-10-CM | POA: Diagnosis not present

## 2017-01-04 MED ORDER — AMLODIPINE BESYLATE 2.5 MG PO TABS: 2.5000 mg | ORAL_TABLET | Freq: Every day | ORAL | 1 refills | Status: DC

## 2017-01-04 MED ORDER — TAMSULOSIN HCL 0.4 MG PO CAPS: 0.4000 mg | ORAL_CAPSULE | Freq: Every day | ORAL | 1 refills | Status: DC

## 2017-01-04 NOTE — Patient Instructions (Addendum)
IF you received an x-ray today, you will receive an invoice from [facility]. Please contact [facility] at [phone] with questions or concerns regarding your invoice.   IF you received labwork today, you will receive an invoice from [facility]. Please contact [facility] at [phone] with questions or concerns regarding your invoice.   Our billing staff will not be able to assist you with questions regarding bills from these companies.  You will be contacted with the lab results as soon as they are available. The fastest way to get your results is to activate your My Chart account. Instructions are located on the last page of this paperwork. If you have not heard from [place] regarding the results in 2 weeks, please contact this office.      N?i soi ??i trng, Ng??i l?n Colonoscopy, Adult N?i soi ??i trng l th?m khm ?? ki?m tra ton b? ru?t gi. Trong qu trnh th?m khm, m?t ?ng ???c bi tr?n, c th? u?n cong ???c ??a vo trong h?u mn v sau ? vo tr?c trng, ??i trng v cc ph?n khc c?a ru?t gi. N?i soi ??i trng th??ng ???c th?c hi?n nh? m?t ph?n c?a khm sng l?c ??i-tr?c k?t trng thng th??ng ho?c ?? ?ng ph v?i m?t s? tri?u ch?ng nh?t ??nh, ch?ng h?n nh? b?nh thi?u mu, tiu ch?y dai d?ng, ?au b?ng v mu trong phn. Th?m khm ny c th? gip sng l?c v ch?n ?on cc v?n ?? b?nh l, bao g?m:  Kh?i u.  Polip.  Vim.  Cc vng ch?y mu.  Hy cho chuyn gia ch?m [place] s?c kh?e bi?t v?:  B?t k? v?n ?? d? ?ng no m qu v? c.  T?t c? cc lo?i thu?c m qu v? ?ang s? d?ng, bao g?m c? vitamin, th?o d??c, thu?c nh? m?t, thu?c d?ng kem, thu?c khng k ??n.  B?t k? v?n ?? g m qu v? ho?c cc thnh vin trong gia ?nh ? g?p ph?i v?i thu?c gy m.  B?t k? r?i lo?n v? mu no m qu v? c.  B?t k? ph?u thu?t no qu v? ? ???c lm.  B?t k? tnh tr?ng b?nh l no c?a qu v?.  B?t k? v?n ?? no m qu v? ? b? trong khi ??i ti?n. Cc nguy c? l  g? Ni chung, ?y l m?t th? thu?t an ton. Tuy nhin, cc v?n ?? c th? x?y ra, bao g?m:  Ch?y mu.  V?t rch trong ru?t.  Ph?n ?ng v?i thu?c ???c cho s? d?ng trong lc th?m khm.  Nhi?m trng (hi?m g?p).  ?i?u g x?y ra tr??c khi lm th? thu?t? Nh?ng h?n ch? v? ?n v u?ng Tun th? ch? d?n c?a chuyn gia ch?m [place] s?c kh?e v? ?n v u?ng, c th? bao g?m:  M?t vi ngy tr??c khi ti?n hnh th? thu?t - tun theo ch? ?? ?n t ch?t x?. Trnh ?n qu? h?ch, cc lo?i h?t, tri cy s?y, tri cy s?ng v rau.  1-3 ngy tr??c ngy ti?n hnh th? thu?t - tun theo ch? ?? ?n ?? l?ng trong. Ch? u?ng cc ?? l?ng trong, ch?ng h?n nh? canh ho?c n??c canh th?t trong, tr ho?c c ph ?en, n??c p trong, n??c ng?t ho?c n??c u?ng th? thao trong, mn trng mi?ng ch?a gelatin v kem que. Trnh u?ng cc ch?t l?ng c ch?a ph?m mu ?? ho?c tm.  Vo ngy ti?n hnh th? thu?t - khng ?n hay u?ng b?t k? th? g  trong vng 2 gi? tr??c khi ti?n hnh th? thu?t, ho?c trong kho?ng th?i gian m chuyn gia ch?m [place] s?c kh?e c?a qu v? ch? d?n.  Lm s?ch ru?t N?u qu v? ? ???c k ??n m?t lo?i thu?c x? qua ???ng u?ng ?? lm s?ch ??i trng:  Hy s?? du?ng theo ch? d?n c?a chuyn gia ch?m [place] s?c kh?e c?a qu v?. B?t ??u vo ngy tr??c khi lm th? thu?t, qu v? s? c?n u?ng m?t l??ng l?n d?ch l?ng pha thu?c. Ch?t l?ng ny s? lm qu v? ??i ti?n phn l?ng nhi?u l?n cho ??n khi phn g?n nh? trong ho?c c mu xanh l cy nh?t.  N?u da ho?c h?u mn c?a qu v? b? kch ?ng do tiu ch?y, qu v? c th? s? d?ng nh?ng th? sau ?? lm gi?m kch ?ng: ? Kh?n lau t?m thu?c, ch?ng h?n nh? kh?n lau ??t c?a ng??i l?n c tinh ch?t l h?i v vitamin E. ? S?n ph?m lm d?u da nh? vaseline.  N?u qu v? b? nn trong khi u?ng thu?c x?, hy ngh? ng?i trong t?i ?a 60 pht v sau ? b?t ??u l?i vi?c lm s?ch ru?t. N?u qu v? ti?p t?c nn v khng th? u?ng thu?c x? m khng b? nn, hy g?i cho chuyn gia ch?m [place] s?c kh?e c?a qu v?.  H??ng d?n  chung  Hy h?i chuyn gia ch?m [place] s?c kh?e v? vi?c thay ??i ho?c d?ng cc lo?i thu?c dng th??ng xuyn c?a qu v?. ?i?u ny ??c bi?t quan tr?ng n?u qu v? ?ang dng thu?c tr? ti?u ???ng ho?c thu?c lm long mu.  C k? ho?ch nh? ai ? ??a quy? vi? t? b?nh vi?n ho?c t? phng khm v? nh. ?i?u g x?y ra trong qu trnh th?c hi?n th? thu?t?  Qu v? c th? ???c ??t m?t ???ng truy?n t?nh m?ch (IV) vo m?t trong cc t?nh m?ch.  Qu v? s? ???c cho dng thu?c ?? gip th? gin (thu?c an th?n).  ?? gi?m nguy c? nhi?m trng: ? ??i ng? nhn vin y t? s? r?a ho?c st trng tay c?a h?. ? Vng h?u mn c?a qu v? s? ???c r?a b?ng x phng.  Qu v? s? ???c yu c?u n?m nghing, hai ??u g?i g?p l?i.  Chuyn gia ch?m [place] s?c kh?e c?a qu v? s? bi tr?n m?t ?ng di, m?ng, m?m. ?ng s? ???c g?n camera v ?n ? ??u.  ?ng s? ???c ??a vo h?u mn c?a qu v?.  ?ng s? ???c nh? nhng ??a qua tr?c trng v ??i trng c?a qu v?.  Khng kh s? ???c b?m vo ??i trng c?a qu v? ?? gi? cho ??i trng m? r?ng. Qu v? c th? c?m th?y m?t cht p l?c ho?c co th?t.  Camera s? ???c s? d?ng ?? ch?p ?nh trong qu trnh ti?n hnh th? thu?t.  M?t m?u m nh? co? th? ????c l?y t?? c? th? c?a qu v? ?? ki?m tra d???i ki?nh hi?n vi (sinh thi?t). N?u pht hi?n th?y b?t k? v?n ?? ti?m tng no, m ny s? ???c g?i ??n phng th nghi?m ?? xt nghi?m.  N?u pht hi?n th?y cc polip nh?, chuyn gia ch?m  s?c kh?e c?a qu v? c th? l?y cc polip ? v mang ?i ki?m tra ?? xem c t? bo ung th? khng.  ?ng ??a vo h?u mn c?a qu v? s? ???c t? t? rt ra. Th? thu?t ny c th? khc nhau  gi?a cc chuyn gia ch?m [place] s?c kh?e v cc b?nh vi?n. ?i?u g x?y ra sau khi lm th? thu?t?  Huy?t p, nh?p tim, nh?p th? v n?ng ?? oxi trong mu c?a qu v? s? ???c theo di cho ??n khi thu?c qu v? ? dng h?t tc d?ng.  Khng li xe trong vng 24 gi? sau khi th?m khm.  Qu v? c th? c m?t l??ng mu nh? trong phn.  Qu v? c th? trung  ti?n v b? co th?t ho?c ch??ng b?ng nh? do khng kh ? ???c s? d?ng ?? lm ph?ng ??i trng c?a qu v? trong lc th?m khm.  Qu v? ???c ty  l?y k?t qu? th? thu?t c?a mnh. Hy h?i chuyn gia ch?m [place] s?c kh?e ho?c khoa th?c hi?n thu? thu?t ?? bi?t khi no c k?t qu? c?a qu v?. Thng tin ny khng nh?m m?c ?ch thay th? cho l?i khuyn m chuyn gia ch?m [place] s?c kh?e ni v?i qu v?. Hy b?o ??m qu v? ph?i th?o lu?n b?t k? v?n ?? g m qu v? c v?i chuyn gia ch?m [place] s?c kh?e c?a qu v?. Document Released: 01/05/2005 Document Revised: 03/10/2016 Document Reviewed: 06/09/2015 Elsevier Interactive Patient Education  2018 [employer].

## 2017-01-04 NOTE — Progress Notes (Signed)
Subjective:    Patient ID: Paul Edwards, male    DOB: 06/08/1955, 61 y.o.   MRN: [id number]  01/04/2017  Annual Exam   HPI This 61 y.o. male presents for Complete PHysical Examination.  Last physical: Colonoscopy:  Never  PSA: Eye exam:  Never; no glasses Dental exam:  never  Neck pain with paresthesias and radiculopathy resolved after last visit; did not need to keep ortho consultation.    Visual Acuity Screening   Right eye Left eye Both eyes  Without correction:  With correction:       BP Readings from Last 3 Encounters:  01/04/17 122/80  11/16/16 138/84  10/10/16 136/82   Wt Readings from Last 3 Encounters:  01/04/17 173 lb (78.5 kg)  11/16/16 175 lb (79.4 kg)  10/10/16 171 lb (77.6 kg)   Immunization History  Administered Date(s) Administered  . Influenza-Unspecified 02/10/2016  . Tdap 01/04/2017    Review of Systems  Constitutional: Negative for activity change, appetite change, chills, diaphoresis, fatigue, fever and unexpected weight change.  HENT: Negative for congestion, dental problem, drooling, ear discharge, ear pain, facial swelling, hearing loss, mouth sores, nosebleeds, postnasal drip, rhinorrhea, sinus pressure, sneezing, sore throat, tinnitus, trouble swallowing and voice change.   Eyes: Negative for photophobia, pain, discharge, redness, itching and visual disturbance.  Respiratory: Negative for apnea, cough, choking, chest tightness, shortness of breath, wheezing and stridor.   Cardiovascular: Negative for chest pain, palpitations and leg swelling.  Gastrointestinal: Negative for abdominal pain, blood in stool, constipation, diarrhea, nausea and vomiting.  Endocrine: Negative for cold intolerance, heat intolerance, polydipsia, polyphagia and polyuria.  Genitourinary: Negative for decreased urine volume, difficulty urinating, discharge, dysuria, enuresis, flank pain, frequency, genital sores, hematuria, penile pain, penile  swelling, scrotal swelling, testicular pain and urgency.       Nocturia x 3.  Urinating is weak for two years.  Sits down to urinate.  Weak until 10:00am.  Musculoskeletal: Negative for arthralgias, back pain, gait problem, joint swelling, myalgias, neck pain and neck stiffness.  Skin: Negative for color change, pallor, rash and wound.  Allergic/Immunologic: Negative for environmental allergies, food allergies and immunocompromised state.  Neurological: Negative for dizziness, tremors, seizures, syncope, facial asymmetry, speech difficulty, weakness, light-headedness, numbness and headaches.  Hematological: Negative for adenopathy. Does not bruise/bleed easily.  Psychiatric/Behavioral: Negative for agitation, behavioral problems, confusion, decreased concentration, dysphoric mood, hallucinations, self-injury, sleep disturbance and suicidal ideas. The patient is not nervous/anxious and is not hyperactive.        Bedtime 9:00pm; wakes up 5:00am.    Past Medical History:  Diagnosis Date  . Allergy   . Asthma   . GERD (gastroesophageal reflux disease)   . Hypertension    Past Surgical History:  Procedure Laterality Date  . LUMBAR DISC ARTHROPLASTY     [place]  . SPINE SURGERY     No Known Allergies Current Outpatient Prescriptions  Medication Sig Dispense Refill  . ADVAIR DISKUS 100-50 MCG/DOSE AEPB   0  . albuterol (PROVENTIL HFA;VENTOLIN HFA) 108 (90 BASE) MCG/ACT inhaler Inhale 2 puffs into the lungs every 4 (four) hours as needed for wheezing or shortness of breath (cough, shortness of breath or wheezing.). 1 Inhaler 1  . amLODipine (NORVASC) 2.5 MG tablet Take 1 tablet (2.5 mg total) by mouth daily. 90 tablet 1  . pantoprazole (PROTONIX) 40 MG tablet Take 1 tablet (40 mg total) by mouth daily. 90 tablet 3  . tamsulosin (FLOMAX) 0.4 MG CAPS capsule Take 1  capsule (0.4 mg total) by mouth daily. 90 capsule 1   No current facility-administered medications for this visit.    Social  History   Social History  . Marital status: Married    Spouse name: N/A  . Number of children: 3  . Years of education: N/A   Occupational History  . machine operator    Social History Main Topics  . Smoking status: Former Smoker    Years: 15.00  . Smokeless tobacco: Never Used  . Alcohol use No  . Drug use: No  . Sexual activity: Yes   Other Topics Concern  . 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 Heart disease Sister 13       AMI one sister       Objective:    BP 122/80   Pulse 64   Temp 98.5 F (36.9 C) (Oral)   Resp 16   Ht 5' 8.5" (1.74 m)   Wt 173 lb (78.5 kg)   SpO2 96%   BMI 25.92 kg/m  Physical Exam  Constitutional: He is oriented to person, place, and time. He appears well-developed and well-nourished. No distress.  HENT:  Head: Normocephalic and atraumatic.  Right Ear: External ear normal.  Left Ear: External ear normal.  Nose: Nose normal.  Mouth/Throat: Oropharynx is clear and moist.  Eyes: Pupils are equal, round, and reactive to light. Conjunctivae and EOM are normal.  Neck: Normal range of motion. Neck supple. Carotid bruit is not present. No thyromegaly present.  Cardiovascular: Normal rate, regular rhythm, normal heart sounds and intact distal pulses.  Exam reveals no gallop and no friction rub.   No murmur heard. Pulmonary/Chest: Effort normal and breath sounds normal. He has no wheezes. He has no rales.  Abdominal: Soft. Bowel sounds are normal. He exhibits no distension and no mass. There is no tenderness. There is no rebound and no guarding.  Hernia confirmed negative in the right inguinal area and confirmed negative in the left inguinal area.  Genitourinary: Rectum normal, testes normal and penis normal. Prostate is enlarged.  Musculoskeletal:       Right shoulder: Normal.       Left shoulder: Normal.       Cervical back: Normal.  Lymphadenopathy:    He has no cervical adenopathy.       Right: No inguinal adenopathy present.       Left: No inguinal adenopathy present.  Neurological: He is alert and oriented to person, place, and time. He has normal reflexes. No cranial nerve deficit. He exhibits normal muscle tone. Coordination normal.  Skin: Skin is warm and dry. No rash noted. He is not diaphoretic.  Psychiatric: He has a normal mood and affect. His behavior is normal. Judgment and thought content normal.    No results found. Depression screen [facility] 2/9 01/04/2017 11/16/2016 10/10/2016 05/28/2016 04/07/2015  Decreased Interest 0 0 0 0 0  Down, Depressed, Hopeless 0 0 0  0 0  PHQ - 2 Score 0 0 0 0 0   Fall Risk  01/04/2017 11/16/2016 10/10/2016 05/28/2016  Falls in the past year? No No No No        Assessment & Plan:   1. Routine physical examination   2. Colon cancer screening   3. Essential hypertension   4. Screening for prostate cancer   5. Screening for diabetes mellitus   6. Gastroesophageal reflux disease without esophagitis   7. Degenerative disc disease, cervical   8. Benign prostatic hyperplasia with nocturia   9. Need for Tdap vaccination    -anticipatory guidance provided --- exercise, weight loss, safe driving practices, aspirin  daily. -obtain age appropriate screening labs and labs for chronic disease management. -BPH symptoms present; obtain PSA and urine; rx for Flomax provided.   Orders Placed This Encounter  Procedures  . Tdap vaccine greater than or equal to 7yo IM  . CBC with Differential/Platelet  . Comprehensive metabolic panel    Order Specific Question:   Has the patient fasted?    Answer:    No  . Lipid panel    Order Specific Question:   Has the patient fasted?    Answer:   No  . PSA  . Urinalysis, dipstick only  . TSH  . Hemoglobin A1c  . Ambulatory referral to Gastroenterology    Referral Priority:   Routine    Referral Type:   Consultation    Referral Reason:   Specialty Services Required    Requested Specialty:   Gastroenterology    Number of Visits Requested:   1   Meds ordered this encounter  Medications  . tamsulosin (FLOMAX) 0.4 MG CAPS capsule    Sig: Take 1 capsule (0.4 mg total) by mouth daily.    Dispense:  90 capsule    Refill:  1  . amLODipine (NORVASC) 2.5 MG tablet    Sig: Take 1 tablet (2.5 mg total) by mouth daily.    Dispense:  90 tablet    Refill:  1    Return in about 6 months (around 07/04/2017) for recheck blood pressure, prostate enlargement.   Azia Toutant Paulita Fujita, M.D. Primary Care at [facility] [address] [place], [place]  [address] [phone] phone [phone] fax

## 2017-01-05 LAB — CBC WITH DIFFERENTIAL/PLATELET
BASOS ABS: 0.1 10*3/uL (ref 0.0–0.2)
Basos: 1 %
EOS (ABSOLUTE): 0.5 10*3/uL — AB (ref 0.0–0.4)
Eos: 7 %
HEMOGLOBIN: 13.7 g/dL (ref 13.0–17.7)
Hematocrit: 41.3 % (ref 37.5–51.0)
Immature Grans (Abs): 0 10*3/uL (ref 0.0–0.1)
Immature Granulocytes: 1 %
LYMPHS ABS: 1.7 10*3/uL (ref 0.7–3.1)
Lymphs: 26 %
MCH: 26.1 pg — AB (ref 26.6–33.0)
MCHC: 33.2 g/dL (ref 31.5–35.7)
MCV: 79 fL (ref 79–97)
MONOCYTES: 6 %
Monocytes Absolute: 0.4 10*3/uL (ref 0.1–0.9)
NEUTROS ABS: 3.9 10*3/uL (ref 1.4–7.0)
Neutrophils: 59 %
Platelets: 176 10*3/uL (ref 150–379)
RBC: 5.24 x10E6/uL (ref 4.14–5.80)
RDW: 14.5 % (ref 12.3–15.4)
WBC: 6.5 10*3/uL (ref 3.4–10.8)

## 2017-01-05 LAB — URINALYSIS, DIPSTICK ONLY
Bilirubin, UA: NEGATIVE
Glucose, UA: NEGATIVE
Ketones, UA: NEGATIVE
LEUKOCYTES UA: NEGATIVE
Nitrite, UA: NEGATIVE
PROTEIN UA: NEGATIVE
RBC UA: NEGATIVE
SPEC GRAV UA: 1.018 (ref 1.005–1.030)
Urobilinogen, Ur: 0.2 mg/dL (ref 0.2–1.0)
pH, UA: 6 (ref 5.0–7.5)

## 2017-01-05 LAB — LIPID PANEL
CHOL/HDL RATIO: 4.1 ratio (ref 0.0–5.0)
Cholesterol, Total: 158 mg/dL (ref 100–199)
HDL: 39 mg/dL — ABNORMAL LOW (ref >39)
LDL CALC: 80 mg/dL (ref 0–99)
Triglycerides: 195 mg/dL — ABNORMAL HIGH (ref 0–149)
VLDL CHOLESTEROL CAL: 39 mg/dL (ref 5–40)

## 2017-01-05 LAB — COMPREHENSIVE METABOLIC PANEL
A/G RATIO: 1.6 (ref 1.2–2.2)
ALBUMIN: 4.7 g/dL (ref 3.6–4.8)
ALK PHOS: 90 IU/L (ref 39–117)
ALT: 34 IU/L (ref 0–44)
AST: 30 IU/L (ref 0–40)
BUN / CREAT RATIO: 11 (ref 10–24)
BUN: 13 mg/dL (ref 8–27)
Bilirubin Total: 0.3 mg/dL (ref 0.0–1.2)
CO2: 26 mmol/L (ref 20–29)
Calcium: 9.4 mg/dL (ref 8.6–10.2)
Chloride: 104 mmol/L (ref 96–106)
Creatinine, Ser: 1.14 mg/dL (ref 0.76–1.27)
GFR calc Af Amer: 79 mL/min/{1.73_m2} (ref >59)
GFR calc non Af Amer: 69 mL/min/{1.73_m2} (ref >59)
GLUCOSE: 100 mg/dL — AB (ref 65–99)
Globulin, Total: 3 g/dL (ref 1.5–4.5)
Potassium: 4.4 mmol/L (ref 3.5–5.2)
SODIUM: 144 mmol/L (ref 134–144)
Total Protein: 7.7 g/dL (ref 6.0–8.5)

## 2017-01-05 LAB — HEMOGLOBIN A1C
Est. average glucose Bld gHb Est-mCnc: 137 mg/dL
Hgb A1c MFr Bld: 6.4 % — ABNORMAL HIGH (ref 4.8–5.6)

## 2017-01-05 LAB — TSH: TSH: 2.44 u[IU]/mL (ref 0.450–4.500)

## 2017-01-05 LAB — PSA: Prostate Specific Ag, Serum: 3.9 ng/mL (ref 0.0–4.0)

## 2017-01-08 ENCOUNTER — Encounter: Payer: Self-pay | Admitting: Family Medicine

## 2017-01-08 DIAGNOSIS — R3912 Poor urinary stream: Secondary | ICD-10-CM | POA: Insufficient documentation

## 2017-01-08 DIAGNOSIS — R351 Nocturia: Secondary | ICD-10-CM

## 2017-01-08 DIAGNOSIS — N401 Enlarged prostate with lower urinary tract symptoms: Secondary | ICD-10-CM | POA: Insufficient documentation

## 2017-02-13 ENCOUNTER — Encounter: Payer: Self-pay | Admitting: Family Medicine

## 2017-02-20 DIAGNOSIS — H52223 Regular astigmatism, bilateral: Secondary | ICD-10-CM | POA: Diagnosis not present

## 2017-02-20 DIAGNOSIS — H524 Presbyopia: Secondary | ICD-10-CM | POA: Diagnosis not present

## 2017-02-20 DIAGNOSIS — H5203 Hypermetropia, bilateral: Secondary | ICD-10-CM | POA: Diagnosis not present

## 2017-04-08 DIAGNOSIS — R0781 Pleurodynia: Secondary | ICD-10-CM | POA: Diagnosis not present

## 2017-04-08 DIAGNOSIS — R0602 Shortness of breath: Secondary | ICD-10-CM | POA: Diagnosis not present

## 2017-04-08 DIAGNOSIS — J22 Unspecified acute lower respiratory infection: Secondary | ICD-10-CM | POA: Diagnosis not present

## 2017-04-23 DIAGNOSIS — J22 Unspecified acute lower respiratory infection: Secondary | ICD-10-CM | POA: Diagnosis not present

## 2017-04-23 DIAGNOSIS — I10 Essential (primary) hypertension: Secondary | ICD-10-CM | POA: Diagnosis not present

## 2017-09-05 ENCOUNTER — Encounter: Payer: Self-pay | Admitting: Family Medicine

## 2017-10-30 ENCOUNTER — Encounter: Payer: Self-pay | Admitting: Physician Assistant

## 2017-10-30 ENCOUNTER — Ambulatory Visit (INDEPENDENT_AMBULATORY_CARE_PROVIDER_SITE_OTHER): Payer: BLUE CROSS/BLUE SHIELD

## 2017-10-30 ENCOUNTER — Other Ambulatory Visit: Payer: Self-pay

## 2017-10-30 ENCOUNTER — Ambulatory Visit: Payer: BLUE CROSS/BLUE SHIELD | Admitting: Physician Assistant

## 2017-10-30 VITALS — BP 155/88 | HR 76 | Temp 98.6°F | Resp 18 | Ht 68.98 in | Wt 174.0 lb

## 2017-10-30 DIAGNOSIS — I10 Essential (primary) hypertension: Secondary | ICD-10-CM | POA: Diagnosis not present

## 2017-10-30 DIAGNOSIS — M109 Gout, unspecified: Secondary | ICD-10-CM

## 2017-10-30 DIAGNOSIS — M79672 Pain in left foot: Secondary | ICD-10-CM | POA: Diagnosis not present

## 2017-10-30 DIAGNOSIS — M7989 Other specified soft tissue disorders: Secondary | ICD-10-CM | POA: Diagnosis not present

## 2017-10-30 LAB — POCT CBC
Granulocyte percent: 78 %G (ref 37–80)
HCT, POC: 41.1 % — AB (ref 43.5–53.7)
Hemoglobin: 13.2 g/dL — AB (ref 14.1–18.1)
LYMPH, POC: 1.6 (ref 0.6–3.4)
MCH, POC: 25.5 pg — AB (ref 27–31.2)
MCHC: 32.1 g/dL (ref 31.8–35.4)
MCV: 79.3 fL — AB (ref 80–97)
MID (CBC): 0.3 (ref 0–0.9)
MPV: 8.5 fL (ref 0–99.8)
PLATELET COUNT, POC: 197 10*3/uL (ref 142–424)
POC Granulocyte: 6.6 (ref 2–6.9)
POC LYMPH %: 18.7 % (ref 10–50)
POC MID %: 3.3 % (ref 0–12)
RBC: 5.19 M/uL (ref 4.69–6.13)
RDW, POC: 13 %
WBC: 8.4 10*3/uL (ref 4.6–10.2)

## 2017-10-30 MED ORDER — ALLOPURINOL 100 MG PO TABS: ORAL_TABLET | ORAL | 0 refills | Status: DC

## 2017-10-30 MED ORDER — INDOMETHACIN 50 MG PO CAPS: 50.0000 mg | ORAL_CAPSULE | Freq: Three times a day (TID) | ORAL | 1 refills | Status: AC

## 2017-10-30 NOTE — Patient Instructions (Addendum)
For acute flare up, use indomethacin 3 times a day for next 5 days.  After flare, start allopurinol daily. Avoid aggravating foods.  In terms of elevated blood pressure, I would like you to check your blood pressure at least a couple times over the next week outside of the office and document these values. It is best if you check the blood pressure at different times in the day. Your goal is <140/90. If your values are consistently above this goal, please return to office for further evaluation. If you start to have chest pain, blurred vision, shortness of breath, severe headache, lower leg swelling, or nausea/vomiting please seek care immediately here or at the ED.     Low-Purine Diet Purines are compounds that affect the level of uric acid in your body. A low-purine diet is a diet that is low in purines. Eating a low-purine diet can prevent the level of uric acid in your body from getting too high and causing gout or kidney stones or both. What do I need to know about this diet?  Choose low-purine foods. Examples of low-purine foods are listed in the next section.  Drink plenty of fluids, especially water. Fluids can help remove uric acid from your body. Try to drink 8-16 cups (1.9-3.8 L) a day.  Limit foods high in fat, especially saturated fat, as fat makes it harder for the body to get rid of uric acid. Foods high in saturated fat include pizza, cheese, ice cream, whole milk, fried foods, and gravies. Choose foods that are lower in fat and lean sources of protein. Use olive oil when cooking as it contains healthy fats that are not high in saturated fat.  Limit alcohol. Alcohol interferes with the elimination of uric acid from your body. If you are having a gout attack, avoid all alcohol.  Keep in mind that different people's bodies react differently to different foods. You will probably learn over time which foods do or do not affect you. If you discover that a food tends to cause your gout  to flare up, avoid eating that food. You can more freely enjoy foods that do not cause problems. If you have any questions about a food item, talk to your dietitian or health care provider. Which foods are low, moderate, and high in purines? The following is a list of foods that are low, moderate, and high in purines. You can eat any amount of the foods that are low in purines. You may be able to have small amounts of foods that are moderate in purines. Ask your health care provider how much of a food moderate in purines you can have. Avoid foods high in purines. Grains  Foods low in purines: Enriched white bread, pasta, rice, cake, cornbread, popcorn.  Foods moderate in purines: Whole-grain breads and cereals, wheat germ, bran, oatmeal. Uncooked oatmeal. Dry wheat bran or wheat germ.  Foods high in purines: Pancakes, [place] toast, biscuits, muffins. Vegetables  Foods low in purines: All vegetables, except those that are moderate in purines.  Foods moderate in purines: Asparagus, cauliflower, spinach, mushrooms, green peas. Fruits  All fruits are low in purines. Meats and other Protein Foods  Foods low in purines: Eggs, nuts, peanut butter.  Foods moderate in purines: 80-90% lean beef, lamb, veal, pork, poultry, fish, eggs, peanut butter, nuts. Crab, lobster, oysters, and shrimp. Cooked dried beans, peas, and lentils.  Foods high in purines: Anchovies, sardines, herring, mussels, tuna, codfish, scallops, trout, and haddock. Paul Edwards. Organ meats (  such as liver or kidney). Tripe. Game meat. Goose. Sweetbreads. Dairy  All dairy foods are low in purines. Low-fat and fat-free dairy products are best because they are low in saturated fat. Beverages  Drinks low in purines: Water, carbonated beverages, tea, coffee, cocoa.  Drinks moderate in purines: Soft drinks and other drinks sweetened with high-fructose corn syrup. Juices. To find whether a food or drink is sweetened with high-fructose corn  syrup, look at the ingredients list.  Drinks high in purines: Alcoholic beverages (such as beer). Condiments  Foods low in purines: Salt, herbs, olives, pickles, relishes, vinegar.  Foods moderate in purines: Butter, margarine, oils, mayonnaise. Fats and Oils  Foods low in purines: All types, except gravies and sauces made with meat.  Foods high in purines: Gravies and sauces made with meat. Other Foods  Foods low in purines: Sugars, sweets, gelatin. Cake. Soups made without meat.  Foods moderate in purines: Meat-based or fish-based soups, broths, or bouillons. Foods and drinks sweetened with high-fructose corn syrup.  Foods high in purines: High-fat desserts (such as ice cream, cookies, cakes, pies, doughnuts, and chocolate). Contact your dietitian for more information on foods that are not listed here. This information is not intended to replace advice given to you by your health care provider. Make sure you discuss any questions you have with your health care provider. Document Released: 07/23/2010 Document Revised: 09/03/2015 Document Reviewed: 03/04/2013 Elsevier Interactive Patient Education  2017 [employer]  IF you received an x-ray today, you will receive an invoice from [facility]. Please contact [facility] at [phone] with questions or concerns regarding your invoice.   IF you received labwork today, you will receive an invoice from [facility]. Please contact [facility] at [phone] with questions or concerns regarding your invoice.   Our billing staff will not be able to assist you with questions regarding bills from these companies.  You will be contacted with the lab results as soon as they are available. The fastest way to get your results is to activate your My Chart account. Instructions are located on the last page of this paperwork. If you have not heard from us regarding the results in 2 weeks, please contact this office.

## 2017-10-30 NOTE — Progress Notes (Signed)
Paul Edwards  MRN: [id number] DOB: October 10, 1955  Subjective:  Paul Edwards is a 62 y.o. male seen in office today for a chief complaint of left foot pain x3 days.  Has associated redness, warmth, and swelling. Aggravating factors: ambulating and light touch.  Denies acute injury, fever, chills, nausea, and vomiting.  Has a past medical history of gout.  Last flare up was a year ago.  Typically takes daily allopurinol but ran out a little while ago.  Has been eating more red meat recently.  Has not tried anything for relief yet. Denies any other questions or concerns.  Review of Systems  Respiratory: Negative for shortness of breath.   Cardiovascular: Negative for chest pain and palpitations.  Gastrointestinal: Negative for abdominal pain.  Genitourinary: Negative for hematuria.  Neurological: Negative for dizziness and light-headedness.      Patient Active Problem List   Diagnosis Date Noted  . Benign prostatic hyperplasia with nocturia 01/08/2017  . Degenerative disc disease, cervical 12/05/2016  . Gastroesophageal reflux disease without esophagitis 12/05/2016  . Essential hypertension 05/12/2008  . Cough 05/12/2008    Current Outpatient Medications on File Prior to Visit  Medication Sig Dispense Refill  . ADVAIR DISKUS 100-50 MCG/DOSE AEPB   0  . albuterol (PROVENTIL HFA;VENTOLIN HFA) 108 (90 BASE) MCG/ACT inhaler Inhale 2 puffs into the lungs every 4 (four) hours as needed for wheezing or shortness of breath (cough, shortness of breath or wheezing.). 1 Inhaler 1  . amLODipine (NORVASC) 2.5 MG tablet Take 1 tablet (2.5 mg total) by mouth daily. 90 tablet 1  . pantoprazole (PROTONIX) 40 MG tablet Take 1 tablet (40 mg total) by mouth daily. 90 tablet 3  . tamsulosin (FLOMAX) 0.4 MG CAPS capsule Take 1 capsule (0.4 mg total) by mouth daily. 90 capsule 1  . allopurinol (ZYLOPRIM) 100 MG tablet TAKE 1 TABLET BY MOUTH ONCE DAILY IF NEEDED  4  . indomethacin (INDOCIN) 50 MG capsule   0    No current facility-administered medications on file prior to visit.     No Known Allergies    Social History   Socioeconomic History  . Marital status: Married    Spouse name: Not on file  . Number of children: 3  . Years of education: Not on file  . Highest education level: Not on file  Occupational History  . Occupation: Location manager  Social Needs  . Financial resource strain: Not on file  . Food insecurity:    Worry: Not on file    Inability: Not on file  . Transportation needs:    Medical: Not on file    Non-medical: Not on file  Tobacco Use  . Smoking status: Former Smoker    Years: 15.00  . Smokeless tobacco: Never Used  Substance and Sexual Activity  . Alcohol use: Yes    Alcohol/week: 0.0 oz    Comment: occ  . Drug use: No  . Sexual activity: Yes  Lifestyle  . Physical activity:    Days per week: Not on file    Minutes per session: Not on file  . Stress: Not on file  Relationships  . Social connections:    Talks on phone: Not on file    Gets together: Not on file    Attends religious service: Not on file    Active member of club or organization: Not on file    Attends meetings of clubs or organizations: Not on file    Relationship status: Not on  file  . Intimate partner violence:    Fear of current or ex partner: Not on file    Emotionally abused: Not on file    Physically abused: Not on file    Forced sexual activity: Not on file  Other Topics Concern  . 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 He appears well-developed and well-nourished. No distress.  HENT:  Head: Normocephalic and atraumatic.  Eyes: Conjunctivae are normal.  Neck: Normal range of motion.  Pulmonary/Chest: Effort normal.  Musculoskeletal:       Feet:  Neurological: He is alert and oriented to person, place, and time.  Skin: Skin is warm and dry.  Psychiatric: He has a normal mood and affect.  Vitals reviewed.  Results for orders placed or performed in visit on 10/30/17 (from the past 24 hour(s))  POCT CBC     Status: Abnormal   Collection Time: 10/30/17  3:22 PM  Result Value Ref Range   WBC 8.4 4.6 - 10.2 K/uL   Lymph, poc 1.6 0.6 - 3.4   POC LYMPH PERCENT 18.7 10 - 50 %L   MID (cbc) 0.3 0 - 0.9   POC MID % 3.3 0 - 12 %M   POC Granulocyte 6.6 2 - 6.9   Granulocyte percent 78.0 37 - 80 %G   RBC 5.19 4.69 - 6.13 M/uL   Hemoglobin 13.2 (A) 14.1 - 18.1 g/dL   HCT, POC 81.1 (A) 91.4 - 53.7 %   MCV 79.3 (A) 80 - 97 fL   MCH, POC 25.5 (A) 27 - 31.2 pg   MCHC 32.1 31.8 - 35.4 g/dL   RDW, POC 78.2 %   Platelet Count, POC 197 142 - 424 K/uL   MPV 8.5 0 - 99.8 fL   Dg Toe 2nd Left  Result Date: 10/30/2017 CLINICAL DATA:  62 year old male with warmth redness and swelling over the 2nd MTP joint. EXAM: LEFT SECOND TOE COMPARISON:  None. FINDINGS: Distal soft tissue swelling is evident at the foot on the lateral view. No soft tissue gas. Although there is minimal 1st MTP joint space loss, the 2nd MTP joint appears normal. No periarticular erosions or osseous abnormality is identified. Accessory ossicles are suspected adjacent to the medial and lateral tarsal bones. There is mild dystrophic calcification in the plantar fascia. IMPRESSION: Soft tissue swelling with no acute or arthropathic bony changes identified about the left 2nd MCP. Electronically Signed   By: Odessa Fleming M.D.   On:  10/30/2017 15:21     Assessment and Plan :  1. Acute gout involving toe of left foot, unspecified cause Hx and PE findings consistent with gout. Do  not suggest infectious etiology. He is afebrile. WBC wnl. Plain films with no acute bony abnormality. Encouraged to eliminate foods from diet which provoke flareups.  Given list.  Uric acid value pending.  May need to increase daily allopurinol dose depending on uric acid level.  Given Rx for indomethacin to use for flare.  Follow-up as needed. - POCT CBC - Uric Acid - DG Toe 2nd Left; Future - indomethacin (INDOCIN) 50 MG capsule; Take 1 capsule (50 mg total) by mouth 3 (three) times daily with meals for 5 days. Start w/in 24-48 hours of flare onset; stop 2-3 days after resolution of clinical signs  Dispense: 30 capsule; Refill: 1 - allopurinol (ZYLOPRIM) 100 MG tablet; TAKE 1 TABLET BY MOUTH ONCE DAILY  Dispense: 90 tablet; Refill: 0  2. Essential hypertension Asymptomatic. Instructed to check bp outside of office over the next couple of weeks. Return if consistently >140/90. Given strict ED precautions.    Benjiman CoreBrittany Wiseman PA-C  [facility] at [facility]  [facility] 10/30/2017 3:31 PM

## 2017-10-31 LAB — URIC ACID: URIC ACID: 6.1 mg/dL (ref 3.7–8.6)

## 2017-11-08 ENCOUNTER — Encounter: Payer: Self-pay | Admitting: *Deleted

## 2018-01-15 DIAGNOSIS — M79671 Pain in right foot: Secondary | ICD-10-CM | POA: Diagnosis not present

## 2018-01-15 DIAGNOSIS — E039 Hypothyroidism, unspecified: Secondary | ICD-10-CM | POA: Diagnosis not present

## 2018-01-15 DIAGNOSIS — Z0000 Encounter for general adult medical examination without abnormal findings: Secondary | ICD-10-CM | POA: Diagnosis not present

## 2018-01-29 DIAGNOSIS — M109 Gout, unspecified: Secondary | ICD-10-CM | POA: Diagnosis not present

## 2018-01-29 DIAGNOSIS — I10 Essential (primary) hypertension: Secondary | ICD-10-CM | POA: Diagnosis not present

## 2018-01-29 DIAGNOSIS — R945 Abnormal results of liver function studies: Secondary | ICD-10-CM | POA: Diagnosis not present

## 2018-01-29 DIAGNOSIS — E039 Hypothyroidism, unspecified: Secondary | ICD-10-CM | POA: Diagnosis not present

## 2018-02-09 ENCOUNTER — Encounter: Payer: Self-pay | Admitting: Emergency Medicine

## 2018-02-09 ENCOUNTER — Other Ambulatory Visit: Payer: Self-pay

## 2018-02-09 ENCOUNTER — Ambulatory Visit: Payer: BLUE CROSS/BLUE SHIELD | Admitting: Emergency Medicine

## 2018-02-09 ENCOUNTER — Ambulatory Visit (INDEPENDENT_AMBULATORY_CARE_PROVIDER_SITE_OTHER): Payer: BLUE CROSS/BLUE SHIELD

## 2018-02-09 VITALS — BP 150/73 | HR 73 | Temp 98.0°F | Resp 16 | Ht 67.75 in | Wt 172.8 lb

## 2018-02-09 DIAGNOSIS — R109 Unspecified abdominal pain: Secondary | ICD-10-CM | POA: Insufficient documentation

## 2018-02-09 DIAGNOSIS — R195 Other fecal abnormalities: Secondary | ICD-10-CM | POA: Diagnosis not present

## 2018-02-09 HISTORY — DX: Unspecified abdominal pain: R10.9

## 2018-02-09 LAB — POCT CBC
Granulocyte percent: 80.3 %G — AB (ref 37–80)
HEMATOCRIT: 42.2 % — AB (ref 29–41)
Hemoglobin: 13.8 g/dL — AB (ref 9.5–13.5)
LYMPH, POC: 1.6 (ref 0.6–3.4)
MCH: 26.6 pg — AB (ref 27–31.2)
MCHC: 32.8 g/dL (ref 31.8–35.4)
MCV: 81.1 fL (ref 76–111)
MID (CBC): 0.3 (ref 0–0.9)
MPV: 8.4 fL (ref 0–99.8)
POC Granulocyte: 7.6 — AB (ref 2–6.9)
POC LYMPH PERCENT: 16.5 %L (ref 10–50)
POC MID %: 3.2 % (ref 0–12)
Platelet Count, POC: 198 10*3/uL (ref 142–424)
RBC: 5.2 M/uL (ref 4.69–6.13)
RDW, POC: 14 %
WBC: 9.5 10*3/uL (ref 4.6–10.2)

## 2018-02-09 LAB — POCT URINALYSIS DIP (MANUAL ENTRY)
BILIRUBIN UA: NEGATIVE
Blood, UA: NEGATIVE
GLUCOSE UA: NEGATIVE mg/dL
Ketones, POC UA: NEGATIVE mg/dL
LEUKOCYTES UA: NEGATIVE
Nitrite, UA: NEGATIVE
PROTEIN UA: NEGATIVE mg/dL
Spec Grav, UA: 1.02 (ref 1.010–1.025)
Urobilinogen, UA: 0.2 E.U./dL
pH, UA: 6 (ref 5.0–8.0)

## 2018-02-09 MED ORDER — RANITIDINE HCL 300 MG PO TABS: 300.0000 mg | ORAL_TABLET | Freq: Every day | ORAL | 1 refills | Status: DC

## 2018-02-09 MED ORDER — OMEPRAZOLE 40 MG PO CPDR: 40.0000 mg | DELAYED_RELEASE_CAPSULE | Freq: Every day | ORAL | 3 refills | Status: DC

## 2018-02-09 NOTE — Progress Notes (Signed)
Paul Edwards 62 y.o.   Chief Complaint  Patient presents with  . Abdominal Pain    RIGHT side stomach area with vomiting x 2 weeks  . Cough    x 3 days productive with green mucus    HISTORY OF PRESENT ILLNESS: This is a 62 y.o. male complaining of burning pain to epigastric, left upper quadrant areas for the past 2 weeks.  Denies melena or rectal bleeding.  Has had a cough for 3 days.  Denies fever or chills.  No other significant symptoms.  Complaint: Abdominal pain Location: Epigastric and left upper quadrant Quality: Burning Duration: 2 weeks Timing: Random Context: 20 time Modifying Factors: No modifying factors Associated Symptoms: No associated symptoms  HPI   Prior to Admission medications   Medication Sig Start Date End Date Taking? Authorizing Provider  ADVAIR DISKUS 100-50 MCG/DOSE AEPB  04/26/16  Yes [provider]  albuterol (PROVENTIL HFA;VENTOLIN HFA) 108 (90 BASE) MCG/ACT inhaler Inhale 2 puffs into the lungs every 4 (four) hours as needed for wheezing or shortness of breath (cough, shortness of breath or wheezing.). 03/11/15  Yes Dorna Leitz, PA-C  allopurinol (ZYLOPRIM) 100 MG tablet TAKE 1 TABLET BY MOUTH ONCE DAILY 10/30/17  Yes Barnett Abu, [place] D, PA-C  amLODipine (NORVASC) 2.5 MG tablet Take 1 tablet (2.5 mg total) by mouth daily. 01/04/17  Yes Ethelda Chick, MD  pantoprazole (PROTONIX) 40 MG tablet Take 1 tablet (40 mg total) by mouth daily. Patient not taking: Reported on 02/09/2018 10/10/16   Ethelda Chick, MD  tamsulosin (FLOMAX) 0.4 MG CAPS capsule Take 1 capsule (0.4 mg total) by mouth daily. 01/04/17   Ethelda Chick, MD    No Known Allergies  Patient Active Problem List   Diagnosis Date Noted  . Benign prostatic hyperplasia with nocturia 01/08/2017  . Degenerative disc disease, cervical 12/05/2016  . Gastroesophageal reflux disease without esophagitis 12/05/2016  . Essential hypertension 05/12/2008  . Cough 05/12/2008    Past  Medical History:  Diagnosis Date  . Allergy   . Asthma   . GERD (gastroesophageal reflux disease)   . Hypertension     Past Surgical History:  Procedure Laterality Date  . LUMBAR DISC ARTHROPLASTY     [facility]  . SPINE SURGERY      Social History   Socioeconomic History  . Marital status: Married    Spouse name: Not on file  . Number of children: 3  . Years of education: Not on file  . Highest education level: Not on file  Occupational History  . Occupation: Location manager  Social Needs  . Financial resource strain: Not on file  . Food insecurity:    Worry: Not on file    Inability: Not on file  . Transportation needs:    Medical: Not on file    Non-medical: Not on file  Tobacco Use  . Smoking status: Former Smoker    Years: 15.00  . Smokeless tobacco: Never Used  Substance and Sexual Activity  . Alcohol use: Yes    Alcohol/week: 0.0 standard drinks    Comment: occ  . Drug use: No  . Sexual activity: Yes  Lifestyle  . Physical activity:    Days per week: Not on file    Minutes per session: Not on file  . Stress: Not on file  Relationships  . Social connections:    Talks on phone: Not on file    Gets together: Not on file    Attends  religious service: Not on file    Active member of club or organization: Not on file    Attends meetings of clubs or organizations: Not on file    Relationship status: Not on file  . Intimate partner violence:    Fear of current or ex partner: Not on file    Emotionally abused: Not on file    Physically abused: Not on file    Forced sexual activity: Not on file  Other Topics Concern  . 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 Heart disease Sister 32       AMI one sister     Review of Systems  Constitutional: Negative.  Negative for chills and fever.  HENT: Negative.   Eyes: Negative.   Respiratory: Negative.  Negative for cough and shortness of breath.   Cardiovascular: Negative.  Negative for chest pain and palpitations.  Gastrointestinal: Positive for abdominal pain. Negative for blood in stool, diarrhea, melena, nausea and vomiting.  Genitourinary: Negative.  Negative for dysuria and hematuria.  Musculoskeletal: Negative.  Negative for back pain, myalgias and neck pain.  Skin: Negative.  Negative for rash.  Neurological: Negative.  Negative for dizziness and headaches.  Endo/Heme/Allergies: Negative.   All other systems reviewed and are negative.   Vitals:   02/09/18 1205  BP: (!) 150/73  Pulse: 73  Resp: 16  Temp: 98 F (36.7 C)  SpO2: 96%    Physical Exam  Constitutional: He is oriented to person, place, and time. He appears well-developed and well-nourished.  HENT:  Head: Normocephalic and atraumatic.  Nose: Nose normal.  Mouth/Throat: Oropharynx is clear and moist.  Eyes: Pupils are equal, round, and reactive to light. Conjunctivae and EOM are normal.  Neck: Normal range of motion. Neck supple.  Cardiovascular: Normal rate, regular rhythm and normal heart sounds.  Pulmonary/Chest: Effort normal and breath sounds normal.  Abdominal: Soft. Normal appearance and bowel sounds are normal. He exhibits no mass. There is no hepatosplenomegaly. There is tenderness in the epigastric area and left upper quadrant. There is no rigidity, no rebound and no guarding.  Musculoskeletal: Normal range of motion. He exhibits no edema.  Lymphadenopathy:    He has no cervical adenopathy.  Neurological: He is alert and oriented to person, place, and time. No sensory deficit. He exhibits normal muscle tone.    Skin: Skin is warm and dry. Capillary refill takes less than 2 seconds.  Psychiatric: He has a normal mood and affect. His behavior is normal.  Vitals reviewed. Dg Abd Acute W/chest  Result Date: 02/09/2018 CLINICAL DATA:  Upper abdominal pain EXAM: DG ABDOMEN ACUTE W/ 1V CHEST COMPARISON:  Chest x-ray 05/16/2014 FINDINGS: Mild elevation the right diaphragm, stable. There is no edema, consolidation, effusion, or pneumothorax. Normal heart size and mediastinal contours. Normal bowel gas pattern. Moderate stool throughout the colon without obstruction or rectal impaction. No concerning mass effect or calcification. IMPRESSION: 1. Moderate stool volume  without obstruction or rectal impaction. 2. Stable, negative chest. Electronically Signed   By: Marnee Spring M.D.   On: 02/09/2018 12:49   Results for orders placed or performed in visit on 02/09/18 (from the past 24 hour(s))  POCT CBC     Status: Abnormal   Collection Time: 02/09/18 12:56 PM  Result Value Ref Range   WBC 9.5 4.6 - 10.2 K/uL   Lymph, poc 1.6 0.6 - 3.4   POC LYMPH PERCENT 16.5 10 - 50 %L   MID (cbc) 0.3 0 - 0.9   POC MID % 3.2 0 - 12 %M   POC Granulocyte 7.6 (A) 2 - 6.9   Granulocyte percent 80.3 (A) 37 - 80 %G   RBC 5.20 4.69 - 6.13 M/uL   Hemoglobin 13.8 (A) 9.5 - 13.5 g/dL   HCT, POC 29.5 (A) 29 - 41 %   MCV 81.1 76 - 111 fL   MCH, POC 26.6 (A) 27 - 31.2 pg   MCHC 32.8 31.8 - 35.4 g/dL   RDW, POC 62.1 %   Platelet Count, POC 198 142 - 424 K/uL   MPV 8.4 0 - 99.8 fL  POCT urinalysis dipstick     Status: None   Collection Time: 02/09/18 12:58 PM  Result Value Ref Range   Color, UA yellow yellow   Clarity, UA clear clear   Glucose, UA negative negative mg/dL   Bilirubin, UA negative negative   Ketones, POC UA negative negative mg/dL   Spec Grav, UA 3.086 5.784 - 1.025   Blood, UA negative negative   pH, UA 6.0 5.0 - 8.0   Protein Ur, POC negative negative mg/dL   Urobilinogen, UA 0.2 0.2 or 1.0 E.U./dL    Nitrite, UA Negative Negative   Leukocytes, UA Negative Negative     A total of 40 minutes was spent in the room with the patient, greater than 50% of which was in counseling/coordination of care regarding differential diagnosis, treatment, medications, review of blood work and x-rays, prognosis, and need for follow-up.  ASSESSMENT & PLAN: Abdominal pain Clinically stable.  Stable vital signs.  Benign abdominal exam.  No red flag signs or symptoms.  Normal CBC.  Unremarkable x-ray.  Most likely gastritis versus ulcer disease.  Patient also has a history of GERD with esophagitis.  Will start patient on PPIs and H2 blocker and monitor progress.  Follow-up in 2 weeks.  Reubin was seen today for abdominal pain and cough.  Diagnoses and all orders for this visit:  Abdominal pain, unspecified abdominal location -     POCT urinalysis dipstick -     POCT CBC -     Comprehensive metabolic panel -     Lipase -     DG Abd Acute W/Chest; Future -     omeprazole (PRILOSEC) 40 MG capsule; Take 1 capsule (40 mg total) by mouth daily. -     ranitidine (ZANTAC) 300 MG tablet; Take 1 tablet (300 mg total) by mouth at bedtime for 14 days.    Patient Instructions       If you have lab work done today you will be contacted with your lab results within the next 2 weeks.  If you have not heard from [place] then please contact us. The fastest way to get your results is to register for My Chart.   IF you received an x-ray today, you will receive an invoice from [facility]. Please contact [facility] at [phone] with questions  or concerns regarding your invoice.   IF you received labwork today, you will receive an invoice from [facility]. Please contact [facility] at [phone] with questions or concerns regarding your invoice.   Our billing staff will not be able to assist you with questions regarding bills from these companies.  You will be contacted with the lab results as soon as  they are available. The fastest way to get your results is to activate your My Chart account. Instructions are located on the last page of this paperwork. If you have not heard from [place] regarding the results in 2 weeks, please contact this office.     Abdominal Pain, Adult Many things can cause belly (abdominal) pain. Most times, belly pain is not dangerous. Many cases of belly pain can be watched and treated at home. Sometimes belly pain is serious, though. Your doctor will try to find the cause of your belly pain. Follow these instructions at home:  Take over-the-counter and prescription medicines only as told by your doctor. Do not take medicines that help you poop (laxatives) unless told to by your doctor.  Drink enough fluid to keep your pee (urine) clear or pale yellow.  Watch your belly pain for any changes.  Keep all follow-up visits as told by your doctor. This is important. Contact a doctor if:  Your belly pain changes or gets worse.  You are not hungry, or you lose weight without trying.  You are having trouble pooping (constipated) or have watery poop (diarrhea) for more than 2-3 days.  You have pain when you pee or poop.  Your belly pain wakes you up at night.  Your pain gets worse with meals, after eating, or with certain foods.  You are throwing up and cannot keep anything down.  You have a fever. Get help right away if:  Your pain does not go away as soon as your doctor says it should.  You cannot stop throwing up.  Your pain is only in areas of your belly, such as the right side or the left lower part of the belly.  You have bloody or black poop, or poop that looks like tar.  You have very bad pain, cramping, or bloating in your belly.  You have signs of not having enough fluid or water in your body (dehydration), such as: ? Dark pee, very little pee, or no pee. ? Cracked lips. ? Dry mouth. ? Sunken eyes. ? Sleepiness. ? Weakness. This information is  not intended to replace advice given to you by your health care provider. Make sure you discuss any questions you have with your health care provider. Document Released: 09/14/2007 Document Revised: 10/16/2015 Document Reviewed: 09/09/2015 Elsevier Interactive Patient Education  2018 Elsevier Inc.      Edwina Barth, MD [facility]

## 2018-02-09 NOTE — Patient Instructions (Addendum)
     If you have lab work done today you will be contacted with your lab results within the next 2 weeks.  If you have not heard from us then please contact us. The fastest way to get your results is to register for My Chart.   IF you received an x-ray today, you will receive an invoice from [facility]. Please contact [facility] at [phone] with questions or concerns regarding your invoice.   IF you received labwork today, you will receive an invoice from [facility]. Please contact [facility] at [phone] with questions or concerns regarding your invoice.   Our billing staff will not be able to assist you with questions regarding bills from these companies.  You will be contacted with the lab results as soon as they are available. The fastest way to get your results is to activate your My Chart account. Instructions are located on the last page of this paperwork. If you have not heard from us regarding the results in 2 weeks, please contact this office.     Abdominal Pain, Adult Many things can cause belly (abdominal) pain. Most times, belly pain is not dangerous. Many cases of belly pain can be watched and treated at home. Sometimes belly pain is serious, though. Your doctor will try to find the cause of your belly pain. Follow these instructions at home:  Take over-the-counter and prescription medicines only as told by your doctor. Do not take medicines that help you poop (laxatives) unless told to by your doctor.  Drink enough fluid to keep your pee (urine) clear or pale yellow.  Watch your belly pain for any changes.  Keep all follow-up visits as told by your doctor. This is important. Contact a doctor if:  Your belly pain changes or gets worse.  You are not hungry, or you lose weight without trying.  You are having trouble pooping (constipated) or have watery poop (diarrhea) for more than 2-3 days.  You have pain when you pee or poop.  Your belly pain  wakes you up at night.  Your pain gets worse with meals, after eating, or with certain foods.  You are throwing up and cannot keep anything down.  You have a fever. Get help right away if:  Your pain does not go away as soon as your doctor says it should.  You cannot stop throwing up.  Your pain is only in areas of your belly, such as the right side or the left lower part of the belly.  You have bloody or black poop, or poop that looks like tar.  You have very bad pain, cramping, or bloating in your belly.  You have signs of not having enough fluid or water in your body (dehydration), such as: ? Dark pee, very little pee, or no pee. ? Cracked lips. ? Dry mouth. ? Sunken eyes. ? Sleepiness. ? Weakness. This information is not intended to replace advice given to you by your health care provider. Make sure you discuss any questions you have with your health care provider. Document Released: 09/14/2007 Document Revised: 10/16/2015 Document Reviewed: 09/09/2015 Elsevier Interactive Patient Education  2018 Elsevier Inc.  

## 2018-02-09 NOTE — Assessment & Plan Note (Signed)
Clinically stable.  Stable vital signs.  Benign abdominal exam.  No red flag signs or symptoms.  Normal CBC.  Unremarkable x-ray.  Most likely gastritis versus ulcer disease.  Patient also has a history of GERD with esophagitis.  Will start patient on PPIs and H2 blocker and monitor progress.  Follow-up in 2 weeks.

## 2018-02-10 LAB — COMPREHENSIVE METABOLIC PANEL
A/G RATIO: 1.9 (ref 1.2–2.2)
ALK PHOS: 79 IU/L (ref 39–117)
ALT: 34 IU/L (ref 0–44)
AST: 24 IU/L (ref 0–40)
Albumin: 5 g/dL — ABNORMAL HIGH (ref 3.6–4.8)
BUN/Creatinine Ratio: 14 (ref 10–24)
BUN: 16 mg/dL (ref 8–27)
Bilirubin Total: 0.4 mg/dL (ref 0.0–1.2)
CO2: 23 mmol/L (ref 20–29)
Calcium: 9.6 mg/dL (ref 8.6–10.2)
Chloride: 104 mmol/L (ref 96–106)
Creatinine, Ser: 1.11 mg/dL (ref 0.76–1.27)
GFR calc Af Amer: 81 mL/min/{1.73_m2} (ref >59)
GFR calc non Af Amer: 70 mL/min/{1.73_m2} (ref >59)
GLOBULIN, TOTAL: 2.6 g/dL (ref 1.5–4.5)
Glucose: 78 mg/dL (ref 65–99)
POTASSIUM: 4.5 mmol/L (ref 3.5–5.2)
SODIUM: 143 mmol/L (ref 134–144)
Total Protein: 7.6 g/dL (ref 6.0–8.5)

## 2018-02-10 LAB — LIPASE: LIPASE: 25 U/L (ref 13–78)

## 2018-02-13 ENCOUNTER — Encounter: Payer: Self-pay | Admitting: *Deleted

## 2018-02-16 ENCOUNTER — Encounter (HOSPITAL_COMMUNITY): Payer: Self-pay | Admitting: Emergency Medicine

## 2018-02-16 ENCOUNTER — Ambulatory Visit (HOSPITAL_COMMUNITY)
Admission: EM | Admit: 2018-02-16 | Discharge: 2018-02-16 | Disposition: A | Discharge status: Home / Self Care | Payer: BLUE CROSS/BLUE SHIELD | Attending: Internal Medicine | Admitting: Internal Medicine

## 2018-02-16 DIAGNOSIS — J4531 Mild persistent asthma with (acute) exacerbation: Secondary | ICD-10-CM

## 2018-02-16 DIAGNOSIS — J40 Bronchitis, not specified as acute or chronic: Secondary | ICD-10-CM | POA: Diagnosis not present

## 2018-02-16 MED ORDER — IPRATROPIUM-ALBUTEROL 0.5-2.5 (3) MG/3ML IN SOLN
3.0000 mL | Freq: Once | RESPIRATORY_TRACT | Status: AC
  Administered 2018-02-16: 3 mL via RESPIRATORY_TRACT

## 2018-02-16 MED ORDER — BENZONATATE 100 MG PO CAPS: 100.0000 mg | ORAL_CAPSULE | Freq: Three times a day (TID) | ORAL | 0 refills | Status: DC

## 2018-02-16 MED ORDER — AZITHROMYCIN 250 MG PO TABS: 250.0000 mg | ORAL_TABLET | Freq: Every day | ORAL | 0 refills | Status: DC

## 2018-02-16 MED ORDER — IPRATROPIUM-ALBUTEROL 0.5-2.5 (3) MG/3ML IN SOLN
RESPIRATORY_TRACT | Status: AC
  Filled 2018-02-16: qty 3

## 2018-02-16 MED ORDER — PREDNISONE 50 MG PO TABS: 50.0000 mg | ORAL_TABLET | Freq: Every day | ORAL | 0 refills | Status: DC

## 2018-02-16 NOTE — Discharge Instructions (Addendum)
Start prednisone and azithromycin as directed.  Continue albuterol inhaler as needed for shortness of breath and wheezing. Tessalon for cough. You can use over the counter flonase,  zyrtec-D for nasal congestion/drainage. You can use over the counter nasal saline rinse such as neti pot for nasal congestion. Keep hydrated, your urine should be clear to pale yellow in color. Tylenol/motrin for fever and pain. Monitor for any worsening of symptoms, chest pain, shortness of breath, wheezing, swelling of the throat, follow up for reevaluation.

## 2018-02-16 NOTE — ED Provider Notes (Signed)
[facility]    CSN: [id number] Arrival date & time: 02/16/18  1522     History   Chief Complaint Chief Complaint  Patient presents with  . Cough    HPI Paul Edwards is a 62 y.o. male.   62 year old male with history of asthma, GERD, HTN comes in for 1 week history of URI symptoms.  States mostly coughing, worse at nighttime.  No obvious rhinorrhea, nasal congestion.  Denies fever, chills, night sweats.  States cough can be productive at times, but usually nonproductive.  He has needed to use his albuterol inhaler, last use last night.  Former smoker.  OTC cold medication without relief.     Past Medical History:  Diagnosis Date  . Allergy   . Asthma   . GERD (gastroesophageal reflux disease)   . Hypertension     Patient Active Problem List   Diagnosis Date Noted  . Abdominal pain 02/09/2018  . Benign prostatic hyperplasia with nocturia 01/08/2017  . Degenerative disc disease, cervical 12/05/2016  . Gastroesophageal reflux disease without esophagitis 12/05/2016  . Essential hypertension 05/12/2008  . Cough 05/12/2008    Past Surgical History:  Procedure Laterality Date  . LUMBAR DISC ARTHROPLASTY     [place]  . SPINE SURGERY         Home Medications    Prior to Admission medications   Medication Sig Start Date End Date Taking? Authorizing Provider  ADVAIR DISKUS 100-50 MCG/DOSE AEPB  04/26/16   [provider]  albuterol (PROVENTIL HFA;VENTOLIN HFA) 108 (90 BASE) MCG/ACT inhaler Inhale 2 puffs into the lungs every 4 (four) hours as needed for wheezing or shortness of breath (cough, shortness of breath or wheezing.). 03/11/15   Dorna Leitz, PA-C  allopurinol (ZYLOPRIM) 100 MG tablet TAKE 1 TABLET BY MOUTH ONCE DAILY 10/30/17   Benjiman Core D, PA-C  amLODipine (NORVASC) 2.5 MG tablet Take 1 tablet (2.5 mg total) by mouth daily. 01/04/17   Ethelda Chick, MD  azithromycin (ZITHROMAX) 250 MG tablet Take 1 tablet (250 mg total) by mouth  daily. Take first 2 tablets together, then 1 every day until finished. 02/16/18   Cathie Hoops, Mendy Lapinsky V, PA-C  benzonatate (TESSALON) 100 MG capsule Take 1 capsule (100 mg total) by mouth every 8 (eight) hours. 02/16/18   Cathie Hoops, Eulala Newcombe V, PA-C  omeprazole (PRILOSEC) 40 MG capsule Take 1 capsule (40 mg total) by mouth daily. 02/09/18   Georgina Quint, MD  pantoprazole (PROTONIX) 40 MG tablet Take 1 tablet (40 mg total) by mouth daily. Patient not taking: Reported on 02/09/2018 10/10/16   Ethelda Chick, MD  predniSONE (DELTASONE) 50 MG tablet Take 1 tablet (50 mg total) by mouth daily. 02/16/18   Cathie Hoops, Katrice Goel V, PA-C  ranitidine (ZANTAC) 300 MG tablet Take 1 tablet (300 mg total) by mouth at bedtime for 14 days. 02/09/18 02/23/18  Georgina Quint, MD  tamsulosin (FLOMAX) 0.4 MG CAPS capsule Take 1 capsule (0.4 mg total) by mouth daily. 01/04/17   Ethelda Chick, MD    Family History Family History  Problem Relation Age of Onset  . Heart disease Sister 50       AMI one sister    Social History Social History   Tobacco Use  . Smoking status: Former Smoker    Years: 15.00  . Smokeless tobacco: Never Used  Substance Use Topics  . Alcohol use: Yes    Alcohol/week: 0.0 standard drinks    Comment: occ  .  Drug use: No     Allergies   Patient has no known allergies.   Review of Systems Review of Systems  Reason unable to perform ROS: See HPI as above.     Physical Exam Triage Vital Signs ED Triage Vitals  Enc Vitals Group     BP 02/16/18 1547 (!) 145/91     Pulse Rate 02/16/18 1547 72     Resp 02/16/18 1547 (!) 30     Temp 02/16/18 1547 (!) 97.2 F (36.2 C)     Temp Source 02/16/18 1547 Oral     SpO2 02/16/18 1547 99 %     Weight --      Height --      Head Circumference --      Peak Flow --      Pain Score 02/16/18 1546 0     Pain Loc --      Pain Edu? --      Excl. in GC? --    No data found.  Updated Vital Signs BP (!) 145/91 (BP Location: Right Arm)   Pulse 72   Temp (!)  97.2 F (36.2 C) (Oral)   Resp (!) 30   SpO2 99%   Physical Exam  Constitutional: He is oriented to person, place, and time. He appears well-developed and well-nourished. No distress.  HENT:  Head: Normocephalic and atraumatic.  Right Ear: Tympanic membrane, external ear and ear canal normal. Tympanic membrane is not erythematous and not bulging.  Left Ear: Tympanic membrane, external ear and ear canal normal. Tympanic membrane is not erythematous and not bulging.  Nose: Nose normal. Right sinus exhibits no maxillary sinus tenderness and no frontal sinus tenderness. Left sinus exhibits no maxillary sinus tenderness and no frontal sinus tenderness.  Mouth/Throat: Uvula is midline, oropharynx is clear and moist and mucous membranes are normal.  Eyes: Pupils are equal, round, and reactive to light. Conjunctivae are normal.  Neck: Normal range of motion. Neck supple.  Cardiovascular: Normal rate, regular rhythm and normal heart sounds. Exam reveals no gallop and no friction rub.  No murmur heard. Pulmonary/Chest:  Speaking in full sentences. Mild tachypnea. Audible expiratory wheezing. No accessory muscle use.   Slightly decreased air movement, expiratory wheezing.  Lymphadenopathy:    He has no cervical adenopathy.  Neurological: He is alert and oriented to person, place, and time.  Skin: Skin is warm and dry. He is not diaphoretic.  Psychiatric: He has a normal mood and affect. His behavior is normal. Judgment normal.     UC Treatments / Results  Labs (all labs ordered are listed, but only abnormal results are displayed) Labs Reviewed - No data to display  EKG None  Radiology No results found.  Procedures Procedures (including critical care time)  Medications Ordered in UC Medications  ipratropium-albuterol (DUONEB) 0.5-2.5 (3) MG/3ML nebulizer solution 3 mL (3 mLs Nebulization Given 02/16/18 1619)    Initial Impression / Assessment and Plan / UC Course  I have reviewed  the triage vital signs and the nursing notes.  Pertinent labs & imaging results that were available during my care of the patient were reviewed by me and considered in my medical decision making (see chart for details).    Symptoms much improved after DuoNeb x1.  Lungs now with much better air movement, clear to auscultation bilaterally without adventitious lung sounds.  Patient no longer tachypneic, breathing nonlabored.  Prednisone and azithromycin as directed.  Patient to continue albuterol inhaler as needed.  Other  symptomatic treatment discussed.  Push fluids.  Return precautions given.  Patient expresses understanding and agrees to plan.  Final Clinical Impressions(s) / UC Diagnoses   Final diagnoses:  Mild persistent asthma with acute exacerbation  Bronchitis    ED Prescriptions    Medication Sig Dispense Auth. Provider   predniSONE (DELTASONE) 50 MG tablet Take 1 tablet (50 mg total) by mouth daily. 5 tablet Chancelor Hardrick V, PA-C   azithromycin (ZITHROMAX) 250 MG tablet Take 1 tablet (250 mg total) by mouth daily. Take first 2 tablets together, then 1 every day until finished. 6 tablet Jhada Risk V, PA-C   benzonatate (TESSALON) 100 MG capsule Take 1 capsule (100 mg total) by mouth every 8 (eight) hours. 21 capsule Threasa Alpha, [place] 02/16/18 1650

## 2018-02-16 NOTE — ED Triage Notes (Signed)
Pt here for cough x 1 week worse at night

## 2018-02-22 ENCOUNTER — Encounter: Payer: Self-pay | Admitting: Emergency Medicine

## 2018-02-22 ENCOUNTER — Other Ambulatory Visit: Payer: Self-pay

## 2018-02-22 ENCOUNTER — Ambulatory Visit: Payer: BLUE CROSS/BLUE SHIELD | Admitting: Emergency Medicine

## 2018-02-22 VITALS — BP 134/77 | HR 70 | Temp 98.6°F | Resp 16 | Ht 68.0 in | Wt 173.4 lb

## 2018-02-22 DIAGNOSIS — R109 Unspecified abdominal pain: Secondary | ICD-10-CM

## 2018-02-22 DIAGNOSIS — R399 Unspecified symptoms and signs involving the genitourinary system: Secondary | ICD-10-CM

## 2018-02-22 DIAGNOSIS — R350 Frequency of micturition: Secondary | ICD-10-CM

## 2018-02-22 DIAGNOSIS — R1032 Left lower quadrant pain: Secondary | ICD-10-CM | POA: Diagnosis not present

## 2018-02-22 HISTORY — DX: Unspecified symptoms and signs involving the genitourinary system: R39.9

## 2018-02-22 HISTORY — DX: Frequency of micturition: R35.0

## 2018-02-22 LAB — POCT CBC
GRANULOCYTE PERCENT: 79.2 % (ref 37–80)
HCT, POC: 40.6 % (ref 29–41)
Hemoglobin: 13.5 g/dL (ref 9.5–13.5)
LYMPH, POC: 1.9 (ref 0.6–3.4)
MCH, POC: 27.2 pg (ref 27–31.2)
MCHC: 33.3 g/dL (ref 31.8–35.4)
MCV: 81.7 fL (ref 76–111)
MID (CBC): 0.2 (ref 0–0.9)
MPV: 8.3 fL (ref 0–99.8)
POC GRANULOCYTE: 7.8 — AB (ref 2–6.9)
POC LYMPH %: 18.7 % (ref 10–50)
POC MID %: 2.1 %M (ref 0–12)
Platelet Count, POC: 203 10*3/uL (ref 142–424)
RBC: 4.97 M/uL (ref 4.69–6.13)
RDW, POC: 13.5 %
WBC: 9.9 10*3/uL (ref 4.6–10.2)

## 2018-02-22 LAB — POCT URINALYSIS DIP (MANUAL ENTRY)
BILIRUBIN UA: NEGATIVE mg/dL
Bilirubin, UA: NEGATIVE
Glucose, UA: NEGATIVE mg/dL
Leukocytes, UA: NEGATIVE
Nitrite, UA: NEGATIVE
PH UA: 6 (ref 5.0–8.0)
Protein Ur, POC: NEGATIVE mg/dL
SPEC GRAV UA: 1.015 (ref 1.010–1.025)
Urobilinogen, UA: 0.2 E.U./dL

## 2018-02-22 MED ORDER — TAMSULOSIN HCL 0.4 MG PO CAPS: 0.4000 mg | ORAL_CAPSULE | Freq: Every day | ORAL | 1 refills | Status: DC

## 2018-02-22 NOTE — Patient Instructions (Addendum)
     If you have lab work done today you will be contacted with your lab results within the next 2 weeks.  If you have not heard from us then please contact us. The fastest way to get your results is to register for My Chart.   IF you received an x-ray today, you will receive an invoice from [facility]. Please contact  Radiology at [phone] with questions or concerns regarding your invoice.   IF you received labwork today, you will receive an invoice from [facility]. Please contact [facility] at [phone] with questions or concerns regarding your invoice.   Our billing staff will not be able to assist you with questions regarding bills from these companies.  You will be contacted with the lab results as soon as they are available. The fastest way to get your results is to activate your My Chart account. Instructions are located on the last page of this paperwork. If you have not heard from us regarding the results in 2 weeks, please contact this office.     Abdominal Pain, Adult Many things can cause belly (abdominal) pain. Most times, belly pain is not dangerous. Many cases of belly pain can be watched and treated at home. Sometimes belly pain is serious, though. Your doctor will try to find the cause of your belly pain. Follow these instructions at home:  Take over-the-counter and prescription medicines only as told by your doctor. Do not take medicines that help you poop (laxatives) unless told to by your doctor.  Drink enough fluid to keep your pee (urine) clear or pale yellow.  Watch your belly pain for any changes.  Keep all follow-up visits as told by your doctor. This is important. Contact a doctor if:  Your belly pain changes or gets worse.  You are not hungry, or you lose weight without trying.  You are having trouble pooping (constipated) or have watery poop (diarrhea) for more than 2-3 days.  You have pain when you pee or poop.  Your belly pain  wakes you up at night.  Your pain gets worse with meals, after eating, or with certain foods.  You are throwing up and cannot keep anything down.  You have a fever. Get help right away if:  Your pain does not go away as soon as your doctor says it should.  You cannot stop throwing up.  Your pain is only in areas of your belly, such as the right side or the left lower part of the belly.  You have bloody or black poop, or poop that looks like tar.  You have very bad pain, cramping, or bloating in your belly.  You have signs of not having enough fluid or water in your body (dehydration), such as: ? Dark pee, very little pee, or no pee. ? Cracked lips. ? Dry mouth. ? Sunken eyes. ? Sleepiness. ? Weakness. This information is not intended to replace advice given to you by your health care provider. Make sure you discuss any questions you have with your health care provider. Document Released: 09/14/2007 Document Revised: 10/16/2015 Document Reviewed: 09/09/2015 Elsevier Interactive Patient Education  2018 Elsevier Inc.  

## 2018-02-22 NOTE — Progress Notes (Signed)
Paul Edwards 62 y.o.   Chief Complaint  Patient presents with  . Abdominal Pain    follow up  . Urinary Frequency    per patient urine flow is slow    HISTORY OF PRESENT ILLNESS: This is a 62 y.o. male complaining of left lower abdominal pain, intermittent and not associated with any other symptoms.  Seen by me on 02/09/2018 with abdominal pain.  Started on omeprazole and Zantac with good results.  Also noted slow urinary flow for several months.  Used to take Flomax but not taking it presently.  Able to eat and drink.  Denies nausea or vomiting.  Denies rectal bleeding or melena.  Denies fever or chills.  Denies chest pain or difficulty breathing.  Recent blood work reviewed: Unremarkable values.  HPI   Prior to Admission medications   Medication Sig Start Date End Date Taking? Authorizing Provider  ADVAIR DISKUS 100-50 MCG/DOSE AEPB  04/26/16  Yes [provider]  albuterol (PROVENTIL HFA;VENTOLIN HFA) 108 (90 BASE) MCG/ACT inhaler Inhale 2 puffs into the lungs every 4 (four) hours as needed for wheezing or shortness of breath (cough, shortness of breath or wheezing.). 03/11/15  Yes Dorna Leitz, PA-C  allopurinol (ZYLOPRIM) 100 MG tablet TAKE 1 TABLET BY MOUTH ONCE DAILY 10/30/17  Yes Barnett Abu, [place] D, PA-C  amLODipine (NORVASC) 2.5 MG tablet Take 1 tablet (2.5 mg total) by mouth daily. 01/04/17  Yes Ethelda Chick, MD  omeprazole (PRILOSEC) 40 MG capsule Take 1 capsule (40 mg total) by mouth daily. 02/09/18  Yes Ules Marsala, Eilleen Kempf, MD  pantoprazole (PROTONIX) 40 MG tablet Take 1 tablet (40 mg total) by mouth daily. 10/10/16  Yes Ethelda Chick, MD  ranitidine (ZANTAC) 300 MG tablet Take 1 tablet (300 mg total) by mouth at bedtime for 14 days. 02/09/18 02/23/18 Yes Easter Schinke, Eilleen Kempf, MD  tamsulosin (FLOMAX) 0.4 MG CAPS capsule Take 1 capsule (0.4 mg total) by mouth daily. 01/04/17  Yes Ethelda Chick, MD    No Known Allergies  Patient Active Problem List   Diagnosis  Date Noted  . Abdominal pain 02/09/2018  . Benign prostatic hyperplasia with nocturia 01/08/2017  . Degenerative disc disease, cervical 12/05/2016  . Gastroesophageal reflux disease without esophagitis 12/05/2016  . Essential hypertension 05/12/2008  . Cough 05/12/2008    Past Medical History:  Diagnosis Date  . Allergy   . Asthma   . GERD (gastroesophageal reflux disease)   . Hypertension     Past Surgical History:  Procedure Laterality Date  . LUMBAR DISC ARTHROPLASTY     [place]  . SPINE SURGERY      Social History   Socioeconomic History  . Marital status: Married    Spouse name: Not on file  . Number of children: 3  . Years of education: Not on file  . Highest education level: Not on file  Occupational History  . Occupation: Location manager  Social Needs  . Financial resource strain: Not on file  . Food insecurity:    Worry: Not on file    Inability: Not on file  . Transportation needs:    Medical: Not on file    Non-medical: Not on file  Tobacco Use  . Smoking status: Former Smoker    Years: 15.00  . Smokeless tobacco: Never Used  Substance and Sexual Activity  . Alcohol use: Yes    Alcohol/week: 0.0 standard drinks    Comment: occ  . Drug use: No  . Sexual activity:  Yes  Lifestyle  . Physical activity:    Days per week: Not on file    Minutes per session: Not on file  . Stress: Not on file  Relationships  . Social connections:    Talks on phone: Not on file    Gets together: Not on file    Attends religious service: Not on file    Active member of club or organization: Not on file    Attends meetings of clubs or organizations: Not on file    Relationship status: Not on file  . Intimate partner violence:    Fear of current or ex partner: Not on file    Emotionally abused: Not on file    Physically abused: Not on file    Forced sexual activity: Not on file  Other Topics Concern  . 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 Heart disease Sister 44       AMI one sister     Review of Systems  Constitutional: Negative.  Negative for chills and fever.  HENT: Negative.  Negative for sore throat.   Eyes: Negative.  Negative for blurred vision and double vision.  Respiratory: Negative.  Negative for cough and shortness of breath.   Cardiovascular: Negative.  Negative for chest pain and palpitations.  Gastrointestinal: Positive for abdominal pain.  Genitourinary:       LUTS symptoms  Musculoskeletal: Negative.   Skin: Negative.  Negative for rash.  Neurological: Negative.  Negative for dizziness and headaches.  Endo/Heme/Allergies: Negative.   All other systems reviewed and are negative.  Vitals:   02/22/18 1537  BP: 134/77  Pulse: 70  Resp: 16  Temp: 98.6 F (37 C)  SpO2: 97%     Physical Exam  Constitutional: He is oriented to person, place, and time. He appears well-developed and well-nourished.  HENT:  Head: Normocephalic and atraumatic.  Mouth/Throat: Oropharynx is clear and moist.  Eyes: Pupils are equal, round, and reactive to light. Conjunctivae and EOM are normal.  Neck: Normal range of motion. Neck supple.  Cardiovascular: Normal rate, regular rhythm and normal heart sounds.  Pulmonary/Chest: Effort normal and breath sounds normal.  Abdominal: Soft. Bowel sounds are normal. There is no hepatosplenomegaly. There is tenderness in the left lower quadrant. There is no rigidity, no guarding and no CVA tenderness.  Musculoskeletal: Normal range of motion.  Neurological: He is alert and oriented to person, place,  and time. No sensory deficit. He exhibits normal muscle tone.  Skin: Skin is warm. Capillary refill takes less than 2 seconds.  Psychiatric: He has a normal mood and affect. His behavior is normal.  Vitals reviewed.  Results for orders placed or performed in visit on 02/22/18 (from the past 24 hour(s))  POCT urinalysis dipstick     Status: Abnormal   Collection Time: 02/22/18  4:05 PM  Result Value Ref Range   Color, UA yellow yellow   Clarity, UA clear  clear   Glucose, UA negative negative mg/dL   Bilirubin, UA negative negative   Ketones, POC UA negative negative mg/dL   Spec Grav, UA 1.610 9.604 - 1.025   Blood, UA trace-lysed (A) negative   pH, UA 6.0 5.0 - 8.0   Protein Ur, POC negative negative mg/dL   Urobilinogen, UA 0.2 0.2 or 1.0 E.U./dL   Nitrite, UA Negative Negative   Leukocytes, UA Negative Negative  POCT CBC     Status: Abnormal   Collection Time: 02/22/18  4:23 PM  Result Value Ref Range   WBC 9.9 4.6 - 10.2 K/uL   Lymph, poc 1.9 0.6 - 3.4   POC LYMPH PERCENT 18.7 10 - 50 %L   MID (cbc) 0.2 0 - 0.9   POC MID % 2.1 0 - 12 %M   POC Granulocyte 7.8 (A) 2 - 6.9   Granulocyte percent 79.2 37 - 80 %G   RBC 4.97 4.69 - 6.13 M/uL   Hemoglobin 13.5 9.5 - 13.5 g/dL   HCT, POC 54.0 29 - 41 %   MCV 81.7 76 - 111 fL   MCH, POC 27.2 27 - 31.2 pg   MCHC 33.3 31.8 - 35.4 g/dL   RDW, POC 98.1 %   Platelet Count, POC 203 142 - 424 K/uL   MPV 8.3 0 - 99.8 fL     ASSESSMENT & PLAN: Terek was seen today for abdominal pain and urinary frequency.  Diagnoses and all orders for this visit:  Abdominal pain, unspecified abdominal location -     POCT CBC  Urinary frequency -     POCT urinalysis dipstick -     Urine Culture -     POCT CBC  Left lower quadrant abdominal pain -     CT Abdomen Pelvis W Contrast; Future  Lower urinary tract symptoms (LUTS) -     tamsulosin (FLOMAX) 0.4 MG CAPS capsule; Take 1 capsule (0.4 mg total) by mouth daily.    Patient  Instructions       If you have lab work done today you will be contacted with your lab results within the next 2 weeks.  If you have not heard from [place] then please contact us. The fastest way to get your results is to register for My Chart.   IF you received an x-ray today, you will receive an invoice from [facility]. Please contact [facility] at [phone] with questions or concerns regarding your invoice.   IF you received labwork today, you will receive an invoice from [facility]. Please contact [facility] at [phone] with questions or concerns regarding your invoice.   Our billing staff will not be able to assist you with questions regarding bills from these companies.  You will be contacted with the lab results as soon as they are available. The fastest way to get your results is to activate your My Chart account. Instructions are located on the last page of this paperwork. If you have not heard from [place] regarding the results in 2 weeks, please contact this office.     Abdominal Pain, Adult Many things can cause belly (abdominal) pain. Most times, belly pain is not dangerous. Many cases of belly pain can be watched and treated at home. Sometimes belly pain is serious, though. Your doctor will try to find the cause of your belly pain. Follow these instructions at home:  Take over-the-counter and prescription medicines only as told by your doctor. Do not take medicines that help  you poop (laxatives) unless told to by your doctor.  Drink enough fluid to keep your pee (urine) clear or pale yellow.  Watch your belly pain for any changes.  Keep all follow-up visits as told by your doctor. This is important. Contact a doctor if:  Your belly pain changes or gets worse.  You are not hungry, or you lose weight without trying.  You are having trouble pooping (constipated) or have watery poop (diarrhea) for more than 2-3 days.  You have pain when you pee or  poop.  Your belly pain wakes you up at night.  Your pain gets worse with meals, after eating, or with certain foods.  You are throwing up and cannot keep anything down.  You have a fever. Get help right away if:  Your pain does not go away as soon as your doctor says it should.  You cannot stop throwing up.  Your pain is only in areas of your belly, such as the right side or the left lower part of the belly.  You have bloody or black poop, or poop that looks like tar.  You have very bad pain, cramping, or bloating in your belly.  You have signs of not having enough fluid or water in your body (dehydration), such as: ? Dark pee, very little pee, or no pee. ? Cracked lips. ? Dry mouth. ? Sunken eyes. ? Sleepiness. ? Weakness. This information is not intended to replace advice given to you by your health care provider. Make sure you discuss any questions you have with your health care provider. Document Released: 09/14/2007 Document Revised: 10/16/2015 Document Reviewed: 09/09/2015 Elsevier Interactive Patient Education  2018 Elsevier Inc.      Edwina BarthMiguel Tyreece Gelles, MD [facility]

## 2018-02-24 LAB — URINE CULTURE: Organism ID, Bacteria: NO GROWTH

## 2018-02-26 ENCOUNTER — Encounter: Payer: Self-pay | Admitting: *Deleted

## 2018-06-06 DIAGNOSIS — R112 Nausea with vomiting, unspecified: Secondary | ICD-10-CM | POA: Diagnosis not present

## 2018-06-06 DIAGNOSIS — Z1211 Encounter for screening for malignant neoplasm of colon: Secondary | ICD-10-CM | POA: Diagnosis not present

## 2018-06-19 DIAGNOSIS — K3189 Other diseases of stomach and duodenum: Secondary | ICD-10-CM | POA: Diagnosis not present

## 2018-06-19 DIAGNOSIS — K635 Polyp of colon: Secondary | ICD-10-CM | POA: Diagnosis not present

## 2018-06-19 DIAGNOSIS — Z1211 Encounter for screening for malignant neoplasm of colon: Secondary | ICD-10-CM | POA: Diagnosis not present

## 2018-06-19 DIAGNOSIS — K2950 Unspecified chronic gastritis without bleeding: Secondary | ICD-10-CM | POA: Diagnosis not present

## 2018-06-19 DIAGNOSIS — D120 Benign neoplasm of cecum: Secondary | ICD-10-CM | POA: Diagnosis not present

## 2018-06-19 DIAGNOSIS — K5730 Diverticulosis of large intestine without perforation or abscess without bleeding: Secondary | ICD-10-CM | POA: Diagnosis not present

## 2018-06-19 DIAGNOSIS — B9681 Helicobacter pylori [H. pylori] as the cause of diseases classified elsewhere: Secondary | ICD-10-CM | POA: Diagnosis not present

## 2018-06-19 DIAGNOSIS — K259 Gastric ulcer, unspecified as acute or chronic, without hemorrhage or perforation: Secondary | ICD-10-CM | POA: Diagnosis not present

## 2018-06-19 DIAGNOSIS — K2980 Duodenitis without bleeding: Secondary | ICD-10-CM | POA: Diagnosis not present

## 2018-06-19 DIAGNOSIS — K2990 Gastroduodenitis, unspecified, without bleeding: Secondary | ICD-10-CM | POA: Diagnosis not present

## 2018-06-19 DIAGNOSIS — D122 Benign neoplasm of ascending colon: Secondary | ICD-10-CM | POA: Diagnosis not present

## 2018-06-19 DIAGNOSIS — R1013 Epigastric pain: Secondary | ICD-10-CM | POA: Diagnosis not present

## 2018-07-16 ENCOUNTER — Other Ambulatory Visit: Payer: Self-pay

## 2018-07-16 ENCOUNTER — Ambulatory Visit (HOSPITAL_COMMUNITY)
Admission: EM | Admit: 2018-07-16 | Discharge: 2018-07-16 | Disposition: A | Discharge status: Home / Self Care | Payer: BLUE CROSS/BLUE SHIELD | Attending: Family Medicine | Admitting: Family Medicine

## 2018-07-16 ENCOUNTER — Encounter (HOSPITAL_COMMUNITY): Payer: Self-pay

## 2018-07-16 ENCOUNTER — Ambulatory Visit (INDEPENDENT_AMBULATORY_CARE_PROVIDER_SITE_OTHER): Payer: BLUE CROSS/BLUE SHIELD

## 2018-07-16 DIAGNOSIS — B349 Viral infection, unspecified: Secondary | ICD-10-CM | POA: Diagnosis not present

## 2018-07-16 DIAGNOSIS — I10 Essential (primary) hypertension: Secondary | ICD-10-CM | POA: Diagnosis not present

## 2018-07-16 DIAGNOSIS — R05 Cough: Secondary | ICD-10-CM

## 2018-07-16 DIAGNOSIS — R0602 Shortness of breath: Secondary | ICD-10-CM | POA: Diagnosis not present

## 2018-07-16 DIAGNOSIS — R059 Cough, unspecified: Secondary | ICD-10-CM

## 2018-07-16 MED ORDER — AMLODIPINE BESYLATE 2.5 MG PO TABS: 2.5000 mg | ORAL_TABLET | Freq: Every day | ORAL | 0 refills | Status: DC

## 2018-07-16 NOTE — ED Provider Notes (Signed)
[facility]    CSN: [id number] Arrival date & time: 07/16/18  1530     History   Chief Complaint Chief Complaint  Patient presents with   Cough    HPI Paul Edwards is a 63 y.o. male.   HPI  Patient states that he does have underlying asthma.  He has been compliant with his inhalers.  He states he noticed cough yesterday afternoon.  Progressed as the evening went on.  He has some shortness of breath.  He is using more of his albuterol.  He has had no fever or chills.  He does feel more tired.  No known exposure to COVID-19, influenza, illness.  No runny or stuffy nose.  Mild sore throat from the coughing.  No nausea or vomiting, GI distress.  No decreased in smell or sense of taste.  He has a dry cough with no sputum production. He also is out of his blood pressure pills.  He states he gets his regular medicines from a doctor who visits his work.  He is advised to get a primary care physician he can see on a regular basis.  Past Medical History:  Diagnosis Date   Allergy    Asthma    GERD (gastroesophageal reflux disease)    Hypertension     Patient Active Problem List   Diagnosis Date Noted   Urinary frequency 02/22/2018   Lower urinary tract symptoms (LUTS) 02/22/2018   Abdominal pain 02/09/2018   Benign prostatic hyperplasia with nocturia 01/08/2017   Degenerative disc disease, cervical 12/05/2016   Gastroesophageal reflux disease without esophagitis 12/05/2016   Essential hypertension 05/12/2008   Cough 05/12/2008    Past Surgical History:  Procedure Laterality Date   LUMBAR DISC ARTHROPLASTY     [place]   SPINE SURGERY         Home Medications    Prior to Admission medications   Medication Sig Start Date End Date Taking? Authorizing Provider  ADVAIR DISKUS 100-50 MCG/DOSE AEPB  04/26/16   [provider]  albuterol (PROVENTIL HFA;VENTOLIN HFA) 108 (90 BASE) MCG/ACT inhaler Inhale 2 puffs into the lungs every 4 (four) hours  as needed for wheezing or shortness of breath (cough, shortness of breath or wheezing.). 03/11/15   Dorna Leitz, PA-C  allopurinol (ZYLOPRIM) 100 MG tablet TAKE 1 TABLET BY MOUTH ONCE DAILY 10/30/17   Benjiman Core D, PA-C  amLODipine (NORVASC) 2.5 MG tablet Take 1 tablet (2.5 mg total) by mouth daily. 07/16/18   Eustace Moore, MD  omeprazole (PRILOSEC) 40 MG capsule Take 1 capsule (40 mg total) by mouth daily. 02/09/18   Georgina Quint, MD  pantoprazole (PROTONIX) 40 MG tablet Take 1 tablet (40 mg total) by mouth daily. 10/10/16   Ethelda Chick, MD  ranitidine (ZANTAC) 300 MG tablet Take 1 tablet (300 mg total) by mouth at bedtime for 14 days. 02/09/18 02/23/18  Georgina Quint, MD  tamsulosin (FLOMAX) 0.4 MG CAPS capsule Take 1 capsule (0.4 mg total) by mouth daily. 02/22/18   Georgina Quint, MD    Family History Family History  Problem Relation Age of Onset   Heart disease Sister 8       AMI one sister    Social History Social History   Tobacco Use   Smoking status: Former Smoker    Years: 15.00   Smokeless tobacco: Never Used  Substance Use Topics   Alcohol use: Yes    Alcohol/week: 0.0 standard drinks  Comment: occ   Drug use: No     Allergies   Patient has no known allergies.   Review of Systems Review of Systems  Constitutional: Positive for fatigue. Negative for chills and fever.  HENT: Negative for ear pain and sore throat.   Eyes: Negative for pain and visual disturbance.  Respiratory: Positive for cough, shortness of breath and wheezing.   Cardiovascular: Negative for chest pain and palpitations.  Gastrointestinal: Negative for abdominal pain and vomiting.  Genitourinary: Negative for dysuria and hematuria.  Musculoskeletal: Negative for arthralgias, back pain and myalgias.  Skin: Negative for color change and rash.  Neurological: Negative for seizures and syncope.  All other systems reviewed and are  negative.    Physical Exam Triage Vital Signs ED Triage Vitals  Enc Vitals Group     BP 07/16/18 1545 (!) 165/98     Pulse Rate 07/16/18 1545 76     Resp 07/16/18 1545 16     Temp 07/16/18 1545 98.2 F (36.8 C)     Temp Source 07/16/18 1545 Oral     SpO2 07/16/18 1545 100 %     Weight --      Height --      Head Circumference --      Peak Flow --      Pain Score 07/16/18 1544 3     Pain Loc --      Pain Edu? --      Excl. in GC? --    No data found.  Updated Vital Signs BP (!) 165/98 (BP Location: Right Arm)    Pulse 76    Temp 98.2 F (36.8 C) (Oral)    Resp 16    SpO2 100%  Patient evaluated in mask, gown, gloves, goggles Physical Exam Constitutional:      General: He is not in acute distress.    Appearance: He is well-developed and normal weight.  HENT:     Head: Normocephalic and atraumatic.     Right Ear: Tympanic membrane and ear canal normal.     Left Ear: Tympanic membrane and ear canal normal.     Nose: Nose normal.     Mouth/Throat:     Mouth: Mucous membranes are moist.     Pharynx: No posterior oropharyngeal erythema.  Eyes:     Conjunctiva/sclera: Conjunctivae normal.     Pupils: Pupils are equal, round, and reactive to light.  Neck:     Musculoskeletal: Normal range of motion.  Cardiovascular:     Rate and Rhythm: Normal rate and regular rhythm.     Heart sounds: Normal heart sounds.  Pulmonary:     Effort: Pulmonary effort is normal. No respiratory distress.     Breath sounds: Wheezing and rales present.     Comments: Faint scattered wheeze.  Rales left base Abdominal:     General: There is no distension.     Palpations: Abdomen is soft.  Musculoskeletal: Normal range of motion.     Right lower leg: No edema.     Left lower leg: No edema.  Lymphadenopathy:     Cervical: No cervical adenopathy.  Skin:    General: Skin is warm and dry.  Neurological:     Mental Status: He is alert.  Psychiatric:        Mood and Affect: Mood normal.         Behavior: Behavior normal.      UC Treatments / Results  Labs (all labs ordered are listed,  but only abnormal results are displayed) Labs Reviewed - No data to display  EKG None  Radiology Dg Chest 2 View  Result Date: 07/16/2018 CLINICAL DATA:  Per pt: started getting sick yesterday with dry cough, no fever, when coughing gets SOB. No out of country travel, hasn't knowingly been around anybody sick. History of acute Asthma when cold weather. No history of cardiac disease. HBP, takes medication. No diabetes. Non smoker EXAM: CHEST - 2 VIEW COMPARISON:  02/09/2018 FINDINGS: Cardiac silhouette is normal in size and configuration. No mediastinal or hilar masses. No evidence of adenopathy. Lungs are clear.  No pleural effusion or pneumothorax. Skeletal structures are intact. IMPRESSION: No active cardiopulmonary disease. Electronically Signed   By: Amie Portland M.D.   On: 07/16/2018 16:21    Procedures Procedures (including critical care time)  Medications Ordered in UC Medications - No data to display  Initial Impression / Assessment and Plan / UC Course  I have reviewed the triage vital signs and the nursing notes.  Pertinent labs & imaging results that were available during my care of the patient were reviewed by me and considered in my medical decision making (see chart for details).     Reviewed with patient that his diagnosis is uncertain.  Although I do not think it is likely he has COVID-19 with 1 day of coughing and no fever, it is certainly possible.  In order to be safe he needs to show stay home for 7 days after symptoms or tonight, or 72 hours after fever.  Information is given to him in writing. Final Clinical Impressions(s) / UC Diagnoses   Final diagnoses:  Cough  Viral illness  Essential hypertension     Discharge Instructions     You need to see a doctor on a regular basis to treat your blood pressure I have refilled 30 days of medicine  You have a  cough.  Some of this may be your asthma, but there is no way to tell if it is a virus, influenza or COVID 19.  You must stay home for a week after the cough started - until at least next monday the 13th.  If you get worse please call for additional advice.  Take mucinex DM or Robitussin for the cough.  Use the normal inhalers.       Person Under Monitoring Name: Paul Edwards  Location: [address] [place] [address]   Infection Prevention Recommendations for Individuals Confirmed to have, or Being Evaluated for, 2019 Novel Coronavirus (COVID-19) Infection Who Receive Care at Home  Individuals who are confirmed to have, or are being evaluated for, COVID-19 should follow the prevention steps below until a healthcare provider or local or state health department says they can return to normal activities.  Stay home except to get medical care You should restrict activities outside your home, except for getting medical care. Do not go to work, school, or public areas, and do not use public transportation or taxis.  Call ahead before visiting your doctor Before your medical appointment, call the healthcare provider and tell them that you have, or are being evaluated for, COVID-19 infection. This will help the healthcare providers office take steps to keep other people from getting infected. Ask your healthcare provider to call the local or state health department.  Monitor your symptoms Seek prompt medical attention if your illness is worsening (e.g., difficulty breathing). Before going to your medical appointment, call the healthcare provider and tell them that you  have, or are being evaluated for, COVID-19 infection. Ask your healthcare provider to call the local or state health department.  Wear a facemask You should wear a facemask that covers your nose and mouth when you are in the same room with other people and when you visit a healthcare provider. People who live with or visit  you should also wear a facemask while they are in the same room with you.  Separate yourself from other people in your home As much as possible, you should stay in a different room from other people in your home. Also, you should use a separate bathroom, if available.  Avoid sharing household items You should not share dishes, drinking glasses, cups, eating utensils, towels, bedding, or other items with other people in your home. After using these items, you should wash them thoroughly with soap and water.  Cover your coughs and sneezes Cover your mouth and nose with a tissue when you cough or sneeze, or you can cough or sneeze into your sleeve. Throw used tissues in a lined trash can, and immediately wash your hands with soap and water for at least 20 seconds or use an alcohol-based hand rub.  Wash your [employer] your hands often and thoroughly with soap and water for at least 20 seconds. You can use an alcohol-based hand sanitizer if soap and water are not available and if your hands are not visibly dirty. Avoid touching your eyes, nose, and mouth with unwashed hands.   Prevention Steps for Caregivers and Household Members of Individuals Confirmed to have, or Being Evaluated for, COVID-19 Infection Being Cared for in the Home  If you live with, or provide care at home for, a person confirmed to have, or being evaluated for, COVID-19 infection please follow these guidelines to prevent infection:  Follow healthcare providers instructions Make sure that you understand and can help the patient follow any healthcare provider instructions for all care.  Provide for the patients basic needs You should help the patient with basic needs in the home and provide support for getting groceries, prescriptions, and other personal needs.  Monitor the patients symptoms If they are getting sicker, call his or her medical provider and tell them that the patient has, or is being evaluated  for, COVID-19 infection. This will help the healthcare providers office take steps to keep other people from getting infected. Ask the healthcare provider to call the local or state health department.  Limit the number of people who have contact with the patient  If possible, have only one caregiver for the patient.  Other household members should stay in another home or place of residence. If this is not possible, they should stay  in another room, or be separated from the patient as much as possible. Use a separate bathroom, if available.  Restrict visitors who do not have an essential need to be in the home.  Keep older adults, very young children, and other sick people away from the patient Keep older adults, very young children, and those who have compromised immune systems or chronic health conditions away from the patient. This includes people with chronic heart, lung, or kidney conditions, diabetes, and cancer.  Ensure good ventilation Make sure that shared spaces in the home have good air flow, such as from an air conditioner or an opened window, weather permitting.  Wash your hands often  Wash your hands often and thoroughly with soap and water for at least 20 seconds. You can use  an alcohol based hand sanitizer if soap and water are not available and if your hands are not visibly dirty.  Avoid touching your eyes, nose, and mouth with unwashed hands.  Use disposable paper towels to dry your hands. If not available, use dedicated cloth towels and replace them when they become wet.  Wear a facemask and gloves  Wear a disposable facemask at all times in the room and gloves when you touch or have contact with the patients blood, body fluids, and/or secretions or excretions, such as sweat, saliva, sputum, nasal mucus, vomit, urine, or feces.  Ensure the mask fits over your nose and mouth tightly, and do not touch it during use.  Throw out disposable facemasks and gloves after  using them. Do not reuse.  Wash your hands immediately after removing your facemask and gloves.  If your personal clothing becomes contaminated, carefully remove clothing and launder. Wash your hands after handling contaminated clothing.  Place all used disposable facemasks, gloves, and other waste in a lined container before disposing them with other household waste.  Remove gloves and wash your hands immediately after handling these items.  Do not share dishes, glasses, or other household items with the patient  Avoid sharing household items. You should not share dishes, drinking glasses, cups, eating utensils, towels, bedding, or other items with a patient who is confirmed to have, or being evaluated for, COVID-19 infection.  After the person uses these items, you should wash them thoroughly with soap and water.  Wash laundry thoroughly  Immediately remove and wash clothes or bedding that have blood, body fluids, and/or secretions or excretions, such as sweat, saliva, sputum, nasal mucus, vomit, urine, or feces, on them.  Wear gloves when handling laundry from the patient.  Read and follow directions on labels of laundry or clothing items and detergent. In general, wash and dry with the warmest temperatures recommended on the label.  Clean all areas the individual has used often  Clean all touchable surfaces, such as counters, tabletops, doorknobs, bathroom fixtures, toilets, phones, keyboards, tablets, and bedside tables, every day. Also, clean any surfaces that may have blood, body fluids, and/or secretions or excretions on them.  Wear gloves when cleaning surfaces the patient has come in contact with.  Use a diluted bleach solution (e.g., dilute bleach with 1 part bleach and 10 parts water) or a household disinfectant with a label that says EPA-registered for coronaviruses. To make a bleach solution at home, add 1 tablespoon of bleach to 1 quart (4 cups) of water. For a larger  supply, add  cup of bleach to 1 gallon (16 cups) of water.  Read labels of cleaning products and follow recommendations provided on product labels. Labels contain instructions for safe and effective use of the cleaning product including precautions you should take when applying the product, such as wearing gloves or eye protection and making sure you have good ventilation during use of the product.  Remove gloves and wash hands immediately after cleaning.  Monitor yourself for signs and symptoms of illness Caregivers and household members are considered close contacts, should monitor their health, and will be asked to limit movement outside of the home to the extent possible. Follow the monitoring steps for close contacts listed on the symptom monitoring form.   ? If you have additional questions, contact your local health department or call the epidemiologist on call at [phone] (available 24/7). ? This guidance is subject to change. For the most up-to-date guidance from [facility],  please refer to their website: TripMetro.huhttps://www.cdc.gov/coronavirus/2019-ncov/hcp/guidance-prevent-spread.html     ED Prescriptions    Medication Sig Dispense Auth. Provider   amLODipine (NORVASC) 2.5 MG tablet Take 1 tablet (2.5 mg total) by mouth daily. 30 tablet Eustace MooreNelson, Alita Waldren Sue, MD     Controlled Substance Prescriptions [place] Controlled Substance Registry consulted? Not Applicable   Eustace MooreNelson, Aiva Miskell Sue, MD 07/16/18 [phone]

## 2018-07-16 NOTE — ED Triage Notes (Signed)
Pt states he is out of his BP meds, requesting refill.

## 2018-07-16 NOTE — Discharge Instructions (Addendum)
You need to see a doctor on a regular basis to treat your blood pressure I have refilled 30 days of medicine  You have a cough.  Some of this may be your asthma, but there is no way to tell if it is a virus, influenza or COVID 19.  You must stay home for a week after the cough started - until at least next monday the 13th.  If you get worse please call for additional advice.  Take mucinex DM or Robitussin for the cough.  Use the normal inhalers.       Person Under Monitoring Name: Paul Edwards  Location: [address] [place] [id number]   Infection Prevention Recommendations for Individuals Confirmed to have, or Being Evaluated for, 2019 Novel Coronavirus (COVID-19) Infection Who Receive Care at Home  Individuals who are confirmed to have, or are being evaluated for, COVID-19 should follow the prevention steps below until a healthcare provider or local or state health department says they can return to normal activities.  Stay home except to get medical care You should restrict activities outside your home, except for getting medical care. Do not go to work, school, or public areas, and do not use public transportation or taxis.  Call ahead before visiting your doctor Before your medical appointment, call the healthcare provider and tell them that you have, or are being evaluated for, COVID-19 infection. This will help the healthcare providers office take steps to keep other people from getting infected. Ask your healthcare provider to call the local or state health department.  Monitor your symptoms Seek prompt medical attention if your illness is worsening (e.g., difficulty breathing). Before going to your medical appointment, call the healthcare provider and tell them that you have, or are being evaluated for, COVID-19 infection. Ask your healthcare provider to call the local or state health department.  Wear a facemask You should wear a facemask that covers your nose and  mouth when you are in the same room with other people and when you visit a healthcare provider. People who live with or visit you should also wear a facemask while they are in the same room with you.  Separate yourself from other people in your home As much as possible, you should stay in a different room from other people in your home. Also, you should use a separate bathroom, if available.  Avoid sharing household items You should not share dishes, drinking glasses, cups, eating utensils, towels, bedding, or other items with other people in your home. After using these items, you should wash them thoroughly with soap and water.  Cover your coughs and sneezes Cover your mouth and nose with a tissue when you cough or sneeze, or you can cough or sneeze into your sleeve. Throw used tissues in a lined trash can, and immediately wash your hands with soap and water for at least 20 seconds or use an alcohol-based hand rub.  Wash your [employer] Corporationhands Wash your hands often and thoroughly with soap and water for at least 20 seconds. You can use an alcohol-based hand sanitizer if soap and water are not available and if your hands are not visibly dirty. Avoid touching your eyes, nose, and mouth with unwashed hands.   Prevention Steps for Caregivers and Household Members of Individuals Confirmed to have, or Being Evaluated for, COVID-19 Infection Being Cared for in the Home  If you live with, or provide care at home for, a person confirmed to have, or being evaluated for,  COVID-19 infection please follow these guidelines to prevent infection:  Follow healthcare providers instructions Make sure that you understand and can help the patient follow any healthcare provider instructions for all care.  Provide for the patients basic needs You should help the patient with basic needs in the home and provide support for getting groceries, prescriptions, and other personal needs.  Monitor the patients  symptoms If they are getting sicker, call his or her medical provider and tell them that the patient has, or is being evaluated for, COVID-19 infection. This will help the healthcare providers office take steps to keep other people from getting infected. Ask the healthcare provider to call the local or state health department.  Limit the number of people who have contact with the patient If possible, have only one caregiver for the patient. Other household members should stay in another home or place of residence. If this is not possible, they should stay in another room, or be separated from the patient as much as possible. Use a separate bathroom, if available. Restrict visitors who do not have an essential need to be in the home.  Keep older adults, very young children, and other sick people away from the patient Keep older adults, very young children, and those who have compromised immune systems or chronic health conditions away from the patient. This includes people with chronic heart, lung, or kidney conditions, diabetes, and cancer.  Ensure good ventilation Make sure that shared spaces in the home have good air flow, such as from an air conditioner or an opened window, weather permitting.  Wash your hands often Wash your hands often and thoroughly with soap and water for at least 20 seconds. You can use an alcohol based hand sanitizer if soap and water are not available and if your hands are not visibly dirty. Avoid touching your eyes, nose, and mouth with unwashed hands. Use disposable paper towels to dry your hands. If not available, use dedicated cloth towels and replace them when they become wet.  Wear a facemask and gloves Wear a disposable facemask at all times in the room and gloves when you touch or have contact with the patients blood, body fluids, and/or secretions or excretions, such as sweat, saliva, sputum, nasal mucus, vomit, urine, or feces.  Ensure the mask fits over  your nose and mouth tightly, and do not touch it during use. Throw out disposable facemasks and gloves after using them. Do not reuse. Wash your hands immediately after removing your facemask and gloves. If your personal clothing becomes contaminated, carefully remove clothing and launder. Wash your hands after handling contaminated clothing. Place all used disposable facemasks, gloves, and other waste in a lined container before disposing them with other household waste. Remove gloves and wash your hands immediately after handling these items.  Do not share dishes, glasses, or other household items with the patient Avoid sharing household items. You should not share dishes, drinking glasses, cups, eating utensils, towels, bedding, or other items with a patient who is confirmed to have, or being evaluated for, COVID-19 infection. After the person uses these items, you should wash them thoroughly with soap and water.  Wash laundry thoroughly Immediately remove and wash clothes or bedding that have blood, body fluids, and/or secretions or excretions, such as sweat, saliva, sputum, nasal mucus, vomit, urine, or feces, on them. Wear gloves when handling laundry from the patient. Read and follow directions on labels of laundry or clothing items and detergent. In general, wash and  dry with the warmest temperatures recommended on the label.  Clean all areas the individual has used often Clean all touchable surfaces, such as counters, tabletops, doorknobs, bathroom fixtures, toilets, phones, keyboards, tablets, and bedside tables, every day. Also, clean any surfaces that may have blood, body fluids, and/or secretions or excretions on them. Wear gloves when cleaning surfaces the patient has come in contact with. Use a diluted bleach solution (e.g., dilute bleach with 1 part bleach and 10 parts water) or a household disinfectant with a label that says EPA-registered for coronaviruses. To make a bleach  solution at home, add 1 tablespoon of bleach to 1 quart (4 cups) of water. For a larger supply, add  cup of bleach to 1 gallon (16 cups) of water. Read labels of cleaning products and follow recommendations provided on product labels. Labels contain instructions for safe and effective use of the cleaning product including precautions you should take when applying the product, such as wearing gloves or eye protection and making sure you have good ventilation during use of the product. Remove gloves and wash hands immediately after cleaning.  Monitor yourself for signs and symptoms of illness Caregivers and household members are considered close contacts, should monitor their health, and will be asked to limit movement outside of the home to the extent possible. Follow the monitoring steps for close contacts listed on the symptom monitoring form.   ? If you have additional questions, contact your local health department or call the epidemiologist on call at [phone] (available 24/7). ? This guidance is subject to change. For the most up-to-date guidance from [facility], please refer to their website: [email or web address]

## 2018-07-16 NOTE — ED Notes (Signed)
Patient verbalizes understanding of discharge instructions. Opportunity for questioning and answers were provided. Patient discharged from [facility] by RN.  

## 2018-07-16 NOTE — ED Triage Notes (Signed)
Patient presents to Urgent Care with complaints of cough since yesterday. Patient states he is not coughing up anything, cough makes his throat sore.

## 2018-08-20 DIAGNOSIS — A048 Other specified bacterial intestinal infections: Secondary | ICD-10-CM | POA: Diagnosis not present

## 2018-08-20 DIAGNOSIS — R112 Nausea with vomiting, unspecified: Secondary | ICD-10-CM | POA: Diagnosis not present

## 2018-09-06 DIAGNOSIS — M109 Gout, unspecified: Secondary | ICD-10-CM | POA: Diagnosis not present

## 2018-09-17 DIAGNOSIS — R112 Nausea with vomiting, unspecified: Secondary | ICD-10-CM | POA: Diagnosis not present

## 2018-09-17 DIAGNOSIS — A048 Other specified bacterial intestinal infections: Secondary | ICD-10-CM | POA: Diagnosis not present

## 2018-09-18 ENCOUNTER — Other Ambulatory Visit: Payer: Self-pay | Admitting: Gastroenterology

## 2018-09-18 DIAGNOSIS — R110 Nausea: Secondary | ICD-10-CM

## 2018-10-02 ENCOUNTER — Encounter (HOSPITAL_COMMUNITY): Payer: Self-pay

## 2018-10-02 ENCOUNTER — Encounter (HOSPITAL_COMMUNITY)
Admission: RE | Admit: 2018-10-02 | Discharge: 2018-10-02 | Disposition: A | Discharge status: Home / Self Care | Payer: BC Managed Care – PPO | Source: Ambulatory Visit | Attending: Gastroenterology | Admitting: Gastroenterology

## 2018-10-02 ENCOUNTER — Other Ambulatory Visit: Payer: Self-pay

## 2018-10-02 DIAGNOSIS — R110 Nausea: Secondary | ICD-10-CM | POA: Insufficient documentation

## 2019-01-19 DIAGNOSIS — Z20828 Contact with and (suspected) exposure to other viral communicable diseases: Secondary | ICD-10-CM | POA: Diagnosis not present

## 2019-01-27 ENCOUNTER — Other Ambulatory Visit: Payer: Self-pay

## 2019-01-27 ENCOUNTER — Emergency Department (HOSPITAL_COMMUNITY)
Admission: EM | Admit: 2019-01-27 | Discharge: 2019-01-27 | Disposition: A | Discharge status: Home / Self Care | Payer: BC Managed Care – PPO | Attending: Emergency Medicine | Admitting: Emergency Medicine

## 2019-01-27 ENCOUNTER — Emergency Department (HOSPITAL_COMMUNITY): Payer: BC Managed Care – PPO

## 2019-01-27 ENCOUNTER — Encounter (HOSPITAL_COMMUNITY): Payer: Self-pay | Admitting: Emergency Medicine

## 2019-01-27 DIAGNOSIS — Z87891 Personal history of nicotine dependence: Secondary | ICD-10-CM | POA: Insufficient documentation

## 2019-01-27 DIAGNOSIS — J45909 Unspecified asthma, uncomplicated: Secondary | ICD-10-CM | POA: Insufficient documentation

## 2019-01-27 DIAGNOSIS — R1012 Left upper quadrant pain: Secondary | ICD-10-CM | POA: Diagnosis not present

## 2019-01-27 DIAGNOSIS — R1013 Epigastric pain: Secondary | ICD-10-CM | POA: Diagnosis not present

## 2019-01-27 DIAGNOSIS — I10 Essential (primary) hypertension: Secondary | ICD-10-CM | POA: Insufficient documentation

## 2019-01-27 DIAGNOSIS — R109 Unspecified abdominal pain: Secondary | ICD-10-CM

## 2019-01-27 DIAGNOSIS — Z79899 Other long term (current) drug therapy: Secondary | ICD-10-CM | POA: Diagnosis not present

## 2019-01-27 DIAGNOSIS — R1033 Periumbilical pain: Secondary | ICD-10-CM | POA: Diagnosis not present

## 2019-01-27 DIAGNOSIS — R1084 Generalized abdominal pain: Secondary | ICD-10-CM | POA: Insufficient documentation

## 2019-01-27 DIAGNOSIS — R1011 Right upper quadrant pain: Secondary | ICD-10-CM | POA: Diagnosis not present

## 2019-01-27 LAB — COMPREHENSIVE METABOLIC PANEL
ALT: 59 U/L — ABNORMAL HIGH (ref 0–44)
AST: 45 U/L — ABNORMAL HIGH (ref 15–41)
Albumin: 4.2 g/dL (ref 3.5–5.0)
Alkaline Phosphatase: 77 U/L (ref 38–126)
Anion gap: 12 (ref 5–15)
BUN: 13 mg/dL (ref 8–23)
CO2: 23 mmol/L (ref 22–32)
Calcium: 9.2 mg/dL (ref 8.9–10.3)
Chloride: 103 mmol/L (ref 98–111)
Creatinine, Ser: 0.9 mg/dL (ref 0.61–1.24)
GFR calc Af Amer: >60 mL/min (ref >60)
GFR calc non Af Amer: >60 mL/min (ref >60)
Glucose, Bld: 110 mg/dL — ABNORMAL HIGH (ref 70–99)
Potassium: 3.9 mmol/L (ref 3.5–5.1)
Sodium: 138 mmol/L (ref 135–145)
Total Bilirubin: 1.3 mg/dL — ABNORMAL HIGH (ref 0.3–1.2)
Total Protein: 7.5 g/dL (ref 6.5–8.1)

## 2019-01-27 LAB — CBC
HCT: 46.8 % (ref 39.0–52.0)
Hemoglobin: 14.7 g/dL (ref 13.0–17.0)
MCH: 26.3 pg (ref 26.0–34.0)
MCHC: 31.4 g/dL (ref 30.0–36.0)
MCV: 83.9 fL (ref 80.0–100.0)
Platelets: 214 10*3/uL (ref 150–400)
RBC: 5.58 MIL/uL (ref 4.22–5.81)
RDW: 12.4 % (ref 11.5–15.5)
WBC: 9 10*3/uL (ref 4.0–10.5)
nRBC: 0 % (ref 0.0–0.2)

## 2019-01-27 LAB — URINALYSIS, ROUTINE W REFLEX MICROSCOPIC
Bilirubin Urine: NEGATIVE
Glucose, UA: NEGATIVE mg/dL
Hgb urine dipstick: NEGATIVE
Ketones, ur: NEGATIVE mg/dL
Leukocytes,Ua: NEGATIVE
Nitrite: NEGATIVE
Protein, ur: NEGATIVE mg/dL
Specific Gravity, Urine: 1.016 (ref 1.005–1.030)
pH: 7 (ref 5.0–8.0)

## 2019-01-27 LAB — LIPASE, BLOOD: Lipase: 27 U/L (ref 11–51)

## 2019-01-27 MED ORDER — SUCRALFATE 1 G PO TABS: 1.0000 g | ORAL_TABLET | Freq: Three times a day (TID) | ORAL | 0 refills | Status: DC

## 2019-01-27 MED ORDER — SODIUM CHLORIDE 0.9 % IV BOLUS
1000.0000 mL | Freq: Once | INTRAVENOUS | Status: AC
  Administered 2019-01-27: 1000 mL via INTRAVENOUS

## 2019-01-27 MED ORDER — IOHEXOL 300 MG/ML  SOLN
100.0000 mL | Freq: Once | INTRAMUSCULAR | Status: AC | PRN
  Administered 2019-01-27: 100 mL via INTRAVENOUS

## 2019-01-27 MED ORDER — ONDANSETRON HCL 4 MG/2ML IJ SOLN
4.0000 mg | Freq: Once | INTRAMUSCULAR | Status: AC
  Administered 2019-01-27: 4 mg via INTRAVENOUS
  Filled 2019-01-27: qty 2

## 2019-01-27 MED ORDER — TRAMADOL HCL 50 MG PO TABS: 50.0000 mg | ORAL_TABLET | Freq: Four times a day (QID) | ORAL | 0 refills | Status: DC | PRN

## 2019-01-27 MED ORDER — MORPHINE SULFATE (PF) 4 MG/ML IV SOLN
4.0000 mg | Freq: Once | INTRAVENOUS | Status: AC
  Administered 2019-01-27: 4 mg via INTRAVENOUS
  Filled 2019-01-27: qty 1

## 2019-01-27 MED ORDER — HYDROMORPHONE HCL 1 MG/ML IJ SOLN
1.0000 mg | Freq: Once | INTRAMUSCULAR | Status: AC
  Administered 2019-01-27: 1 mg via INTRAVENOUS
  Filled 2019-01-27: qty 1

## 2019-01-27 NOTE — ED Provider Notes (Signed)
[facility] EMERGENCY DEPARTMENT Provider Note   CSN: [id number] Arrival date & time: 01/27/19  9169     History   Chief Complaint Chief Complaint  Patient presents with  . Abdominal Pain    HPI Paul Edwards is a 63 y.o. male.     HPI Patient presents to the emergency department with abdominal pain that started last night.  The patient has not had any nausea or vomiting.  Patient has had GI symptoms that have been ongoing for quite a while but this seemed different than previous.  The patient's been seen by GI for chronic vomiting.  Patient states the pain is mainly upper abdominal pain.  Patient states he did not take any medications prior to arrival for his symptoms.  The patient denies chest pain, shortness of breath, headache,blurred vision, neck pain, fever, cough, weakness, numbness, dizziness, anorexia, edema, nausea, vomiting, diarrhea, rash, back pain, dysuria, hematemesis, bloody stool, near syncope, or syncope. Past Medical History:  Diagnosis Date  . Allergy   . Asthma   . GERD (gastroesophageal reflux disease)   . Hypertension     Patient Active Problem List   Diagnosis Date Noted  . Urinary frequency 02/22/2018  . Lower urinary tract symptoms (LUTS) 02/22/2018  . Abdominal pain 02/09/2018  . Benign prostatic hyperplasia with nocturia 01/08/2017  . Degenerative disc disease, cervical 12/05/2016  . Gastroesophageal reflux disease without esophagitis 12/05/2016  . Essential hypertension 05/12/2008  . Cough 05/12/2008    Past Surgical History:  Procedure Laterality Date  . LUMBAR DISC ARTHROPLASTY     [place]  . SPINE SURGERY          Home Medications    Prior to Admission medications   Medication Sig Start Date End Date Taking? Authorizing Provider  ADVAIR DISKUS 100-50 MCG/DOSE AEPB Inhale 2 puffs into the lungs 2 (two) times daily.  04/26/16  Yes [provider]  albuterol (PROVENTIL HFA;VENTOLIN HFA) 108 (90 BASE)  MCG/ACT inhaler Inhale 2 puffs into the lungs every 4 (four) hours as needed for wheezing or shortness of breath (cough, shortness of breath or wheezing.). 03/11/15  Yes Dorna Leitz, PA-C  allopurinol (ZYLOPRIM) 300 MG tablet Take 300 mg by mouth daily. 01/21/19  Yes [provider]  amLODipine (NORVASC) 5 MG tablet Take 5 mg by mouth daily. 01/21/19  Yes [provider]  levothyroxine (SYNTHROID) 50 MCG tablet Take 50 mcg by mouth daily. 10/29/18  Yes [provider]  omeprazole (PRILOSEC) 40 MG capsule Take 1 capsule (40 mg total) by mouth daily. 02/09/18  Yes Sagardia, Eilleen Kempf, MD  pantoprazole (PROTONIX) 40 MG tablet Take 1 tablet (40 mg total) by mouth daily. Patient not taking: Reported on 01/27/2019 10/10/16   Ethelda Chick, MD  ranitidine (ZANTAC) 300 MG tablet Take 1 tablet (300 mg total) by mouth at bedtime for 14 days. Patient not taking: Reported on 01/27/2019 02/09/18 02/23/18  Georgina Quint, MD  tamsulosin (FLOMAX) 0.4 MG CAPS capsule Take 1 capsule (0.4 mg total) by mouth daily. Patient not taking: Reported on 01/27/2019 02/22/18   Georgina Quint, MD    Family History Family History  Problem Relation Age of Onset  . Heart disease Sister 40       AMI one sister    Social History Social History   Tobacco Use  . Smoking status: Former Smoker    Years: 15.00  . Smokeless tobacco: Never Used  Substance Use Topics  . Alcohol use:  Yes    Alcohol/week: 0.0 standard drinks    Comment: occ  . Drug use: No     Allergies   Patient has no known allergies.   Review of Systems Review of Systems All other systems negative except as documented in the HPI. All pertinent positives and negatives as reviewed in the HPI. Physical Exam Updated Vital Signs BP (!) 134/91   Pulse 68   Temp 98.3 F (36.8 C) (Oral)   Resp 18   SpO2 96%   Physical Exam Vitals signs and nursing note reviewed.  Constitutional:      General: He is not  in acute distress.    Appearance: He is well-developed.  HENT:     Head: Normocephalic and atraumatic.  Eyes:     Pupils: Pupils are equal, round, and reactive to light.  Neck:     Musculoskeletal: Normal range of motion and neck supple.  Cardiovascular:     Rate and Rhythm: Normal rate and regular rhythm.     Heart sounds: Normal heart sounds. No murmur. No friction rub. No gallop.   Pulmonary:     Effort: Pulmonary effort is normal. No respiratory distress.     Breath sounds: Normal breath sounds. No wheezing.  Abdominal:     General: Bowel sounds are normal. There is no distension.     Palpations: Abdomen is soft.     Tenderness: There is abdominal tenderness in the right upper quadrant, epigastric area, periumbilical area and left upper quadrant.  Skin:    General: Skin is warm and dry.     Capillary Refill: Capillary refill takes less than 2 seconds.     Findings: No erythema or rash.  Neurological:     Mental Status: He is alert and oriented to person, place, and time.     Motor: No abnormal muscle tone.     Coordination: Coordination normal.  Psychiatric:        Behavior: Behavior normal.      ED Treatments / Results  Labs (all labs ordered are listed, but only abnormal results are displayed) Labs Reviewed  COMPREHENSIVE METABOLIC PANEL - Abnormal; Notable for the following components:      Result Value   Glucose, Bld 110 (*)    AST 45 (*)    ALT 59 (*)    Total Bilirubin 1.3 (*)    All other components within normal limits  URINALYSIS, ROUTINE W REFLEX MICROSCOPIC - Abnormal; Notable for the following components:   APPearance HAZY (*)    All other components within normal limits  LIPASE, BLOOD  CBC    EKG None  Radiology Ct Abdomen Pelvis W Contrast  Result Date: 01/27/2019 CLINICAL DATA:  Mid abdominal pain since yesterday.  Constipation. EXAM: CT ABDOMEN AND PELVIS WITH CONTRAST TECHNIQUE: Multidetector CT imaging of the abdomen and pelvis was  performed using the standard protocol following bolus administration of intravenous contrast. CONTRAST:  OMNIPAQUE IOHEXOL 300 MG/ML  SOLN COMPARISON:  None. FINDINGS: Lower chest: Limited visualization of the lower thorax demonstrates two adjacent nodular opacities within the subpleural aspect of the image right lower lobe with dominant opacity measuring approximately 0.7 cm in diameter (image 8, series 4). Minimal dependent subpleural ground-glass atelectasis. No discrete focal airspace opacities. No pleural effusion. Normal heart size.  No pericardial effusion. Hepatobiliary: Normal hepatic contour. There is mild diffuse decreased attenuation hepatic parenchyma suggestive of hepatic steatosis. Note is made of a approximately 1.3 x 1.4 cm densely calcified granuloma involving the  lateral segment of the left lobe of the liver (image 17, series 3). No discrete worrisome hepatic lesions. Normal appearance of the gallbladder given degree distention. No radiopaque gallstones. No intra or extrahepatic biliary duct dilatation. No ascites. Pancreas: There is a punctate (approximately 2 mm) calcification within the tail of an otherwise normal-appearing pancreas without associated nodule or peripancreatic stranding. Otherwise, normal appearance of the pancreas. Spleen: Normal appearance of the spleen. Adrenals/Urinary Tract: There is symmetric enhancement of the bilateral kidneys. No evidence of nephrolithiasis on this postcontrast examination. Note is made of bilateral subcentimeter hypoattenuating renal lesions, the largest of which within the left kidney measures 0.8 cm in diameter (coronal image 75, series 6, too small to adequately characterize of favored to represent renal cysts. No urine obstruction or perinephric stranding. Normal appearance the bilateral adrenal glands. Normal appearance of the urinary bladder given degree of distention. Stomach/Bowel: There is mild apparent wall thickening involving the  duodenal bulb (representative image 26, series 3; coronal image 40, series 6). There is nonspecific mild dilatation involving several centralized loops of proximal small bowel (representative axial images 35 29 and 35, series 3) with relative transition point identified within the right mid hemiabdomen (image 53, series 6). The etiology is apparent transition site is not identified and thus presumably secondary to adhesions. This finding is associated with relative decompression of the downstream small bowel. Normal appearance of the terminal ileum. The appendix is not identified, however there is no pericecal inflammatory change. Moderate colonic stool burden. No pneumoperitoneum, pneumatosis or portal venous gas. Vascular/Lymphatic: Atherosclerotic plaque within a tortuous but normal caliber abdominal aorta. The major branch vessels of the abdominal aorta appear patent on this non CTA examination. Reproductive: No bulky retroperitoneal, mesenteric, pelvic or inguinal lymphadenopathy. The prostate is enlarged with mass effect on the undersurface of the urinary bladder (sagittal image 88, series 7). No free fluid the pelvic cul-de-sac. Other: Tiny bilateral mesenteric fat containing inguinal hernias. Minimal amount of subcutaneous edema about the midline of the low back. Musculoskeletal: No acute or aggressive osseous abnormalities. Mild multilevel lumbar spine DDD, worse at L4-L5 with disc space height loss, endplate irregularity and sclerosis. IMPRESSION: 1. Mild distension of several loops of small bowel with relative transition site centered within the right mid hemiabdomen as could be seen in the setting of a developing small bowel obstruction. The exact etiology of this suspected transition site is not identified and thus presumably secondary to adhesions. Clinical correlation is advised. 2. Apparent circumferential wall thickening involving the duodenum bulb, potentially reactive, though if the patient is not  experiencing obstructive symptoms, this questionable abnormality could surface the etiology of the patient's abdominal pain. If so, further evaluation with endoscopy could be performed as indicated. 3. Indeterminate nodular opacities within the imaged right lower lobe, the largest of which measures 0.7 cm in diameter. Comparison with prior outside examinations (if available) is advised. Otherwise, a non-contrast chest CT at 3-6 months is recommended. If the nodules are stable at time of repeat CT, then future CT at 18-24 months (from today's scan) is considered optional for low-risk patients, but is recommended for high-risk patients. This recommendation follows the consensus statement: Guidelines for Management of Incidental Pulmonary Nodules Detected on CT Images: From the Fleischner Society 2017; Radiology 2017; 284:228-243. 4. Prostatomegaly with mass effect upon the undersurface of the urinary bladder. If not recently performed, further evaluation with DRE is advised. 5. Aortic Atherosclerosis (ICD10-I70.0). Electronically Signed   By: Simonne ComeJohn  Watts M.D.   On: 01/27/2019  11:10    Procedures Procedures (including critical care time)  Medications Ordered in ED Medications  sodium chloride 0.9 % bolus 1,000 mL (0 mLs Intravenous Stopped 01/27/19 1040)  morphine 4 MG/ML injection 4 mg (4 mg Intravenous Given 01/27/19 0927)  ondansetron (ZOFRAN) injection 4 mg (4 mg Intravenous Given 01/27/19 0926)  iohexol (OMNIPAQUE) 300 MG/ML solution 100 mL (100 mLs Intravenous Contrast Given 01/27/19 1037)  HYDROmorphone (DILAUDID) injection 1 mg (1 mg Intravenous Given 01/27/19 1246)     Initial Impression / Assessment and Plan / ED Course  I have reviewed the triage vital signs and the nursing notes.  Pertinent labs & imaging results that were available during my care of the patient were reviewed by me and considered in my medical decision making (see chart for details).        Patient CT scan did not  yield any specific results.  I did feel that the patient was able to tolerate fluid has not been vomiting.  That seems less likely to be a GI bowel obstruction.  The patient has been stable thus far in the emergency department.  Will be referred to GI. Final Clinical Impressions(s) / ED Diagnoses   Final diagnoses:  None    ED Discharge Orders    None       Dalia Heading, PA-C 01/27/19 1525    Isla Pence, MD 01/27/19 1535

## 2019-01-27 NOTE — ED Triage Notes (Signed)
Patient reports persistent mid abdominal pain onset yesterday , denies emesis or diarrhea , no fever or chills . Last BM Friday.

## 2019-01-27 NOTE — ED Notes (Signed)
Patient transported to CT 

## 2019-01-27 NOTE — Discharge Instructions (Addendum)
Follow-up with the GI doctor provided.  Return here as needed.

## 2019-06-04 ENCOUNTER — Other Ambulatory Visit: Payer: Self-pay

## 2019-06-04 ENCOUNTER — Telehealth: Payer: BLUE CROSS/BLUE SHIELD | Admitting: Registered Nurse

## 2019-06-05 ENCOUNTER — Encounter: Payer: Self-pay | Admitting: Registered Nurse

## 2019-07-25 ENCOUNTER — Other Ambulatory Visit: Payer: Self-pay

## 2019-07-25 ENCOUNTER — Ambulatory Visit (INDEPENDENT_AMBULATORY_CARE_PROVIDER_SITE_OTHER): Payer: BC Managed Care – PPO

## 2019-07-25 ENCOUNTER — Ambulatory Visit (HOSPITAL_COMMUNITY)
Admission: EM | Admit: 2019-07-25 | Discharge: 2019-07-25 | Disposition: A | Discharge status: Home / Self Care | Payer: BC Managed Care – PPO | Attending: Family Medicine | Admitting: Family Medicine

## 2019-07-25 ENCOUNTER — Encounter (HOSPITAL_COMMUNITY): Payer: Self-pay

## 2019-07-25 DIAGNOSIS — J209 Acute bronchitis, unspecified: Secondary | ICD-10-CM

## 2019-07-25 LAB — POCT URINALYSIS DIP (DEVICE)
Bilirubin Urine: NEGATIVE
Glucose, UA: NEGATIVE mg/dL
Hgb urine dipstick: NEGATIVE
Ketones, ur: NEGATIVE mg/dL
Leukocytes,Ua: NEGATIVE
Nitrite: NEGATIVE
Protein, ur: NEGATIVE mg/dL
Specific Gravity, Urine: 1.02 (ref 1.005–1.030)
Urobilinogen, UA: 0.2 mg/dL (ref 0.0–1.0)
pH: 6 (ref 5.0–8.0)

## 2019-07-25 MED ORDER — ALBUTEROL SULFATE HFA 108 (90 BASE) MCG/ACT IN AERS
INHALATION_SPRAY | RESPIRATORY_TRACT | Status: AC
  Filled 2019-07-25: qty 6.7

## 2019-07-25 MED ORDER — PREDNISONE 20 MG PO TABS: 40.0000 mg | ORAL_TABLET | Freq: Every day | ORAL | 0 refills | Status: AC

## 2019-07-25 MED ORDER — ADVAIR DISKUS 100-50 MCG/DOSE IN AEPB: 1.0000 | INHALATION_SPRAY | Freq: Two times a day (BID) | RESPIRATORY_TRACT | 0 refills | Status: DC

## 2019-07-25 MED ORDER — AMOXICILLIN-POT CLAVULANATE 875-125 MG PO TABS: 1.0000 | ORAL_TABLET | Freq: Two times a day (BID) | ORAL | 0 refills | Status: DC

## 2019-07-25 MED ORDER — ALBUTEROL SULFATE HFA 108 (90 BASE) MCG/ACT IN AERS
2.0000 | INHALATION_SPRAY | Freq: Four times a day (QID) | RESPIRATORY_TRACT | Status: DC | PRN
  Administered 2019-07-25: 13:00:00 2 via RESPIRATORY_TRACT

## 2019-07-25 NOTE — ED Triage Notes (Signed)
Pt presents with intermittent chills and fever X 3 weeks.  Pt had covid vaccine 06/25/2019

## 2019-07-25 NOTE — ED Provider Notes (Signed)
[facility]    CSN: [id number] Arrival date & time: 07/25/19  1751      History   Chief Complaint Chief Complaint  Patient presents with  . Chills  . Fever    HPI Paul Edwards is a 64 y.o. male.   HPI  Patient presents with more than 6 weeks of symptoms including chills, subjective fever, cough, and intermittent shortness of breath.  Patient has a history of asthma/COPD. He has been using his albuterol inhaler without significant relief.  He was concerned that his symptoms was related to receipt of the [employer] vaccine back in March.  He denies any close exposures with anyone who is currently sick. Patient has a history of lower urinary disease however denies any current dysuria symptoms. Past Medical History:  Diagnosis Date  . Allergy   . Asthma   . GERD (gastroesophageal reflux disease)   . Hypertension     Patient Active Problem List   Diagnosis Date Noted  . Urinary frequency 02/22/2018  . Lower urinary tract symptoms (LUTS) 02/22/2018  . Abdominal pain 02/09/2018  . Benign prostatic hyperplasia with nocturia 01/08/2017  . Degenerative disc disease, cervical 12/05/2016  . Gastroesophageal reflux disease without esophagitis 12/05/2016  . Essential hypertension 05/12/2008  . Cough 05/12/2008    Past Surgical History:  Procedure Laterality Date  . LUMBAR DISC ARTHROPLASTY     [place]  . SPINE SURGERY       Home Medications    Prior to Admission medications   Medication Sig Start Date End Date Taking? Authorizing Provider  ADVAIR DISKUS 100-50 MCG/DOSE AEPB Inhale 2 puffs into the lungs 2 (two) times daily.  04/26/16   [provider]  albuterol (PROVENTIL HFA;VENTOLIN HFA) 108 (90 BASE) MCG/ACT inhaler Inhale 2 puffs into the lungs every 4 (four) hours as needed for wheezing or shortness of breath (cough, shortness of breath or wheezing.). 03/11/15   Dorna Leitz, PA-C  allopurinol (ZYLOPRIM) 300 MG tablet Take 300 mg by mouth  daily. 01/21/19   [provider]  amLODipine (NORVASC) 5 MG tablet Take 5 mg by mouth daily. 01/21/19   [provider]  levothyroxine (SYNTHROID) 50 MCG tablet Take 50 mcg by mouth daily. 10/29/18   [provider]  omeprazole (PRILOSEC) 40 MG capsule Take 1 capsule (40 mg total) by mouth daily. 02/09/18   Georgina Quint, MD  pantoprazole (PROTONIX) 40 MG tablet Take 1 tablet (40 mg total) by mouth daily. Patient not taking: Reported on 01/27/2019 10/10/16   Ethelda Chick, MD  ranitidine (ZANTAC) 300 MG tablet Take 1 tablet (300 mg total) by mouth at bedtime for 14 days. Patient not taking: Reported on 01/27/2019 02/09/18 02/23/18  Georgina Quint, MD  sucralfate (CARAFATE) 1 g tablet Take 1 tablet (1 g total) by mouth 4 (four) times daily -  with meals and at bedtime. 01/27/19   Lawyer, Cristal Deer, PA-C  tamsulosin (FLOMAX) 0.4 MG CAPS capsule Take 1 capsule (0.4 mg total) by mouth daily. Patient not taking: Reported on 01/27/2019 02/22/18   Georgina Quint, MD  traMADol (ULTRAM) 50 MG tablet Take 1 tablet (50 mg total) by mouth every 6 (six) hours as needed for severe pain. 01/27/19   Charlestine Night, PA-C    Family History Family History  Problem Relation Age of Onset  . Heart disease Sister 70       AMI one sister    Social History Social History   Tobacco Use  .  Smoking status: Former Smoker    Years: 15.00  . Smokeless tobacco: Never Used  Substance Use Topics  . Alcohol use: Yes    Alcohol/week: 0.0 standard drinks    Comment: occ  . Drug use: No    Allergies   Patient has no known allergies. Review of Systems Review of Systems Pertinent negatives listed in HPI  Physical Exam Triage Vital Signs ED Triage Vitals  Enc Vitals Group     BP 07/25/19 1105 (!) 148/90     Pulse Rate 07/25/19 1105 72     Resp 07/25/19 1105 18     Temp 07/25/19 1105 98.8 F (37.1 C)     Temp Source 07/25/19 1105 Oral     SpO2 07/25/19  1105 99 %     Weight --      Height --      Head Circumference --      Peak Flow --      Pain Score 07/25/19 1107 2     Pain Loc --      Pain Edu? --      Excl. in [place]? --    No data found.  Updated Vital Signs BP (!) 148/90 (BP Location: Right Arm)   Pulse 72   Temp 98.8 F (37.1 C) (Oral)   Resp 18   SpO2 99%   Visual Acuity Right Eye Distance:   Left Eye Distance:   Bilateral Distance:    Right Eye Near:   Left Eye Near:    Bilateral Near:     Physical Exam Constitutional:      Appearance: Normal appearance.  HENT:     Head: Normocephalic and atraumatic.     Nose: Nose normal.  Eyes:     Extraocular Movements: Extraocular movements intact.     Pupils: Pupils are equal, round, and reactive to light.  Cardiovascular:     Rate and Rhythm: Normal rate and regular rhythm.  Pulmonary:     Effort: Pulmonary effort is normal.     Breath sounds: Wheezing and rhonchi present.  Abdominal:     General: Abdomen is flat. There is no distension.     Tenderness: There is no abdominal tenderness.  Musculoskeletal:        General: Normal range of motion.     Cervical back: Normal range of motion.  Skin:    General: Skin is warm and dry.  Neurological:     Mental Status: He is alert and oriented to person, place, and time.  Psychiatric:        Mood and Affect: Mood normal.        Behavior: Behavior normal.    UC Treatments / Results  Labs (all labs ordered are listed, but only abnormal results are displayed) Labs Reviewed - No data to display  EKG   Radiology No results found.  Procedures Procedures (including critical care time)  Medications Ordered in UC Medications - No data to display  Initial Impression / Assessment and Plan / UC Course  I have reviewed the triage vital signs and the nursing notes.  Pertinent labs & imaging results that were available during my care of the patient were reviewed by me and considered in my medical decision making (see  chart for details).    Acute bronchitis, unspecified organism, chest x-ray is negative for pneumonia  Prednisone taper 40 mg x 5 days. Albuterol inhaler 2 puffs every 4-6 hours as needed. Encouraged to resume Advair refilled medication as patient  has been out since the latter part of 2018. Precautions and red flags discussed.  Patient verbalizes understanding. Final Clinical Impressions(s) / UC Diagnoses   Final diagnoses:  Acute bronchitis, unspecified organism     Discharge Instructions     Treating today for acute bronchitis.  Start prednisone 40 mg once daily with breakfast for 5 days to help improve work of breathing and decreased wheezing.  Start Augmentin 1 tablet twice daily this is an antibiotic and will resolve any underlying infection.  Resume use of your daily Advair which you were prescribed previously this prevents you from having asthmatic attacks.    ED Prescriptions    Medication Sig Dispense Auth. Provider   ADVAIR DISKUS 100-50 MCG/DOSE AEPB Inhale 1 puff into the lungs 2 (two) times daily. 60 each Scot Jun, FNP   predniSONE (DELTASONE) 20 MG tablet Take 2 tablets (40 mg total) by mouth daily with breakfast for 5 days. 10 tablet Scot Jun, FNP   amoxicillin-clavulanate (AUGMENTIN) 875-125 MG tablet Take 1 tablet by mouth 2 (two) times daily. 20 tablet Scot Jun, FNP     PDMP not reviewed this encounter.   Scot Jun, FNP 07/25/19 [phone]

## 2019-07-25 NOTE — Discharge Instructions (Signed)
Treating today for acute bronchitis.  Start prednisone 40 mg once daily with breakfast for 5 days to help improve work of breathing and decreased wheezing.  Start Augmentin 1 tablet twice daily this is an antibiotic and will resolve any underlying infection.  Resume use of your daily Advair which you were prescribed previously this prevents you from having asthmatic attacks.

## 2019-09-10 ENCOUNTER — Ambulatory Visit (HOSPITAL_COMMUNITY)
Admission: EM | Admit: 2019-09-10 | Discharge: 2019-09-10 | Disposition: A | Discharge status: Home / Self Care | Payer: BC Managed Care – PPO | Attending: Nurse Practitioner | Admitting: Nurse Practitioner

## 2019-09-10 ENCOUNTER — Other Ambulatory Visit: Payer: Self-pay

## 2019-09-10 ENCOUNTER — Encounter (HOSPITAL_COMMUNITY): Payer: Self-pay

## 2019-09-10 DIAGNOSIS — M109 Gout, unspecified: Secondary | ICD-10-CM | POA: Insufficient documentation

## 2019-09-10 LAB — URIC ACID: Uric Acid, Serum: 3.9 mg/dL (ref 3.7–8.6)

## 2019-09-10 MED ORDER — METHYLPREDNISOLONE SODIUM SUCC 125 MG IJ SOLR
INTRAMUSCULAR | Status: AC
  Filled 2019-09-10: qty 2

## 2019-09-10 MED ORDER — KETOROLAC TROMETHAMINE 60 MG/2ML IM SOLN
INTRAMUSCULAR | Status: AC
  Filled 2019-09-10: qty 2

## 2019-09-10 MED ORDER — KETOROLAC TROMETHAMINE 60 MG/2ML IM SOLN
60.0000 mg | Freq: Once | INTRAMUSCULAR | Status: AC
  Administered 2019-09-10: 60 mg via INTRAMUSCULAR

## 2019-09-10 MED ORDER — METHYLPREDNISOLONE SODIUM SUCC 125 MG IJ SOLR
80.0000 mg | Freq: Once | INTRAMUSCULAR | Status: AC
  Administered 2019-09-10: 80 mg via INTRAMUSCULAR

## 2019-09-10 NOTE — ED Provider Notes (Signed)
[facility]    CSN: [id number] Arrival date & time: 09/10/19  0855      History   Chief Complaint Chief Complaint  Patient presents with   Gout    HPI Paul Edwards is a 64 y.o. male.   Subjective:   Paul Edwards is a 64 y.o. male who presents with possible gout. Pain is located in right ankle and right great toe, and has been present for 2 weeks and worse over the past couple of days. Pain is described as aching and sharp, and is constant. Associated symptoms include tenderness and warmth. He denies any decreased range of motion, deformity, edema, effusion or erythema. The patient has tried nothing for pain relief but has been compliant with his daily allopurinol therapy. Related to injury: no.  The following portions of the patient's history were reviewed and updated as appropriate: allergies, current medications, past family history, past medical history, past social history, past surgical history and problem list.       Past Medical History:  Diagnosis Date   Allergy    Asthma    GERD (gastroesophageal reflux disease)    Hypertension     Patient Active Problem List   Diagnosis Date Noted   Urinary frequency 02/22/2018   Lower urinary tract symptoms (LUTS) 02/22/2018   Abdominal pain 02/09/2018   Benign prostatic hyperplasia with nocturia 01/08/2017   Degenerative disc disease, cervical 12/05/2016   Gastroesophageal reflux disease without esophagitis 12/05/2016   Essential hypertension 05/12/2008   Cough 05/12/2008    Past Surgical History:  Procedure Laterality Date   LUMBAR DISC ARTHROPLASTY     [place]   SPINE SURGERY         Home Medications    Prior to Admission medications   Medication Sig Start Date End Date Taking? Authorizing Provider  ADVAIR DISKUS 100-50 MCG/DOSE AEPB Inhale 1 puff into the lungs 2 (two) times daily. 07/25/19   Bing Neighbors, FNP  albuterol (PROVENTIL HFA;VENTOLIN HFA) 108 (90 BASE) MCG/ACT  inhaler Inhale 2 puffs into the lungs every 4 (four) hours as needed for wheezing or shortness of breath (cough, shortness of breath or wheezing.). 03/11/15   Dorna Leitz, PA-C  allopurinol (ZYLOPRIM) 300 MG tablet Take 300 mg by mouth daily. 01/21/19   [provider]  amLODipine (NORVASC) 5 MG tablet Take 5 mg by mouth daily. 01/21/19   [provider]  amoxicillin-clavulanate (AUGMENTIN) 875-125 MG tablet Take 1 tablet by mouth 2 (two) times daily. 07/25/19   Bing Neighbors, FNP  levothyroxine (SYNTHROID) 50 MCG tablet Take 50 mcg by mouth daily. 10/29/18   [provider]  omeprazole (PRILOSEC) 40 MG capsule Take 1 capsule (40 mg total) by mouth daily. 02/09/18   Georgina Quint, MD  pantoprazole (PROTONIX) 40 MG tablet Take 1 tablet (40 mg total) by mouth daily. Patient not taking: Reported on 01/27/2019 10/10/16   Ethelda Chick, MD  ranitidine (ZANTAC) 300 MG tablet Take 1 tablet (300 mg total) by mouth at bedtime for 14 days. Patient not taking: Reported on 01/27/2019 02/09/18 02/23/18  Georgina Quint, MD  sucralfate (CARAFATE) 1 g tablet Take 1 tablet (1 g total) by mouth 4 (four) times daily -  with meals and at bedtime. 01/27/19   Lawyer, Cristal Deer, PA-C  tamsulosin (FLOMAX) 0.4 MG CAPS capsule Take 1 capsule (0.4 mg total) by mouth daily. Patient not taking: Reported on 01/27/2019 02/22/18   Georgina Quint, MD  traMADol Janean Sark) 50  MG tablet Take 1 tablet (50 mg total) by mouth every 6 (six) hours as needed for severe pain. 01/27/19   Dalia Heading, PA-C    Family History Family History  Problem Relation Age of Onset   Heart disease Sister 75       AMI one sister    Social History Social History   Tobacco Use   Smoking status: Former Smoker    Years: 15.00   Smokeless tobacco: Never Used  Substance Use Topics   Alcohol use: Yes    Alcohol/week: 0.0 standard drinks    Comment: occ   Drug use: No      Allergies   Patient has no known allergies.   Review of Systems Review of Systems  Constitutional: Negative for fever.  Musculoskeletal: Positive for arthralgias.  All other systems reviewed and are negative.    Physical Exam Triage Vital Signs ED Triage Vitals  Enc Vitals Group     BP 09/10/19 0926 137/87     Pulse Rate 09/10/19 0926 72     Resp 09/10/19 0926 16     Temp 09/10/19 0926 98.1 F (36.7 C)     Temp Source 09/10/19 0926 Oral     SpO2 09/10/19 0926 99 %     Weight --      Height --      Head Circumference --      Peak Flow --      Pain Score 09/10/19 0925 9     Pain Loc --      Pain Edu? --      Excl. in [place]? --    No data found.  Updated Vital Signs BP 137/87 (BP Location: Right Arm)    Pulse 72    Temp 98.1 F (36.7 C) (Oral)    Resp 16    SpO2 99%   Visual Acuity Right Eye Distance:   Left Eye Distance:   Bilateral Distance:    Right Eye Near:   Left Eye Near:    Bilateral Near:     Physical Exam Vitals reviewed.  Constitutional:      Appearance: Normal appearance.  HENT:     Head: Normocephalic.  Cardiovascular:     Rate and Rhythm: Normal rate.  Pulmonary:     Effort: Pulmonary effort is normal.  Musculoskeletal:        General: Normal range of motion.     Cervical back: Normal range of motion and neck supple.     Right foot: Normal range of motion. No deformity.       Feet:  Feet:     Right foot:     Skin integrity: Skin integrity normal.     Toenail Condition: Right toenails are normal.  Skin:    General: Skin is warm and dry.  Neurological:     General: No focal deficit present.     Mental Status: He is alert and oriented to person, place, and time.  Psychiatric:        Mood and Affect: Mood normal.      UC Treatments / Results  Labs (all labs ordered are listed, but only abnormal results are displayed) Labs Reviewed  URIC ACID    EKG   Radiology No results found.  Procedures Procedures (including  critical care time)  Medications Ordered in UC Medications  methylPREDNISolone sodium succinate (SOLU-MEDROL) 125 mg/2 mL injection 80 mg (has no administration in time range)  ketorolac (TORADOL) injection 60 mg (has no administration  in time range)    Initial Impression / Assessment and Plan / UC Course  I have reviewed the triage vital signs and the nursing notes.  Pertinent labs & imaging results that were available during my care of the patient were reviewed by me and considered in my medical decision making (see chart for details).    64 yo male presenting with acute gout flare of right ankle and right great toe. Serum uric acid pending.  Toradol and Solu-Medrol given in clinic.  Prednisone and naproxen per medication orders. Education about gout causes and treatment discussed.  Today's evaluation has revealed no signs of a dangerous process. Discussed diagnosis with patient and/or guardian. Patient and/or guardian aware of their diagnosis, possible red flag symptoms to watch out for and need for close follow up. Patient and/or guardian understands verbal and written discharge instructions. Patient and/or guardian comfortable with plan and disposition.  Patient and/or guardian has a clear mental status at this time, good insight into illness (after discussion and teaching) and has clear judgment to make decisions regarding their care  This care was provided during an unprecedented National Emergency due to the Novel Coronavirus (COVID-19) pandemic. COVID-19 infections and transmission risks place heavy strains on healthcare resources.  As this pandemic evolves, our facility, providers, and staff strive to respond fluidly, to remain operational, and to provide care relative to available resources and information. Outcomes are unpredictable and treatments are without well-defined guidelines. Further, the impact of COVID-19 on all aspects of urgent care, including the impact to patients seeking  care for reasons other than COVID-19, is unavoidable during this national emergency. At this time of the global pandemic, management of patients has significantly changed, even for non-COVID positive patients given high local and regional COVID volumes at this time requiring high healthcare system and resource utilization. The standard of care for management of both COVID suspected and non-COVID suspected patients continues to change rapidly at the local, regional, national, and global levels. This patient was worked up and treated to the best available but ever changing evidence and resources available at this current time.   Documentation was completed with the aid of voice recognition software. Transcription may contain typographical errors.  Final Clinical Impressions(s) / UC Diagnoses   Final diagnoses:  Acute gout involving toe of right foot, unspecified cause     Discharge Instructions     Take medications as prescribed.  Continue your allopurinol every day.     ED Prescriptions    None     PDMP not reviewed this encounter.   Cathlean Sauer [place], [place] 09/10/19 [phone]

## 2019-09-10 NOTE — ED Triage Notes (Signed)
C/o gout flare. Reports it has been in his big toe for about two weeks and now has moved up to his ankle for the past two days.

## 2019-09-10 NOTE — Discharge Instructions (Signed)
Take medications as prescribed.  Continue your allopurinol every day.

## 2020-01-02 IMAGING — DX DG ABDOMEN ACUTE W/ 1V CHEST
4 series · 4 of 4 positions shown · non-contrast
Comparison: Chest x-ray 05/16/2014

CLINICAL DATA: Upper abdominal pain

EXAM:
DG ABDOMEN ACUTE W/ 1V CHEST

[chest pa]
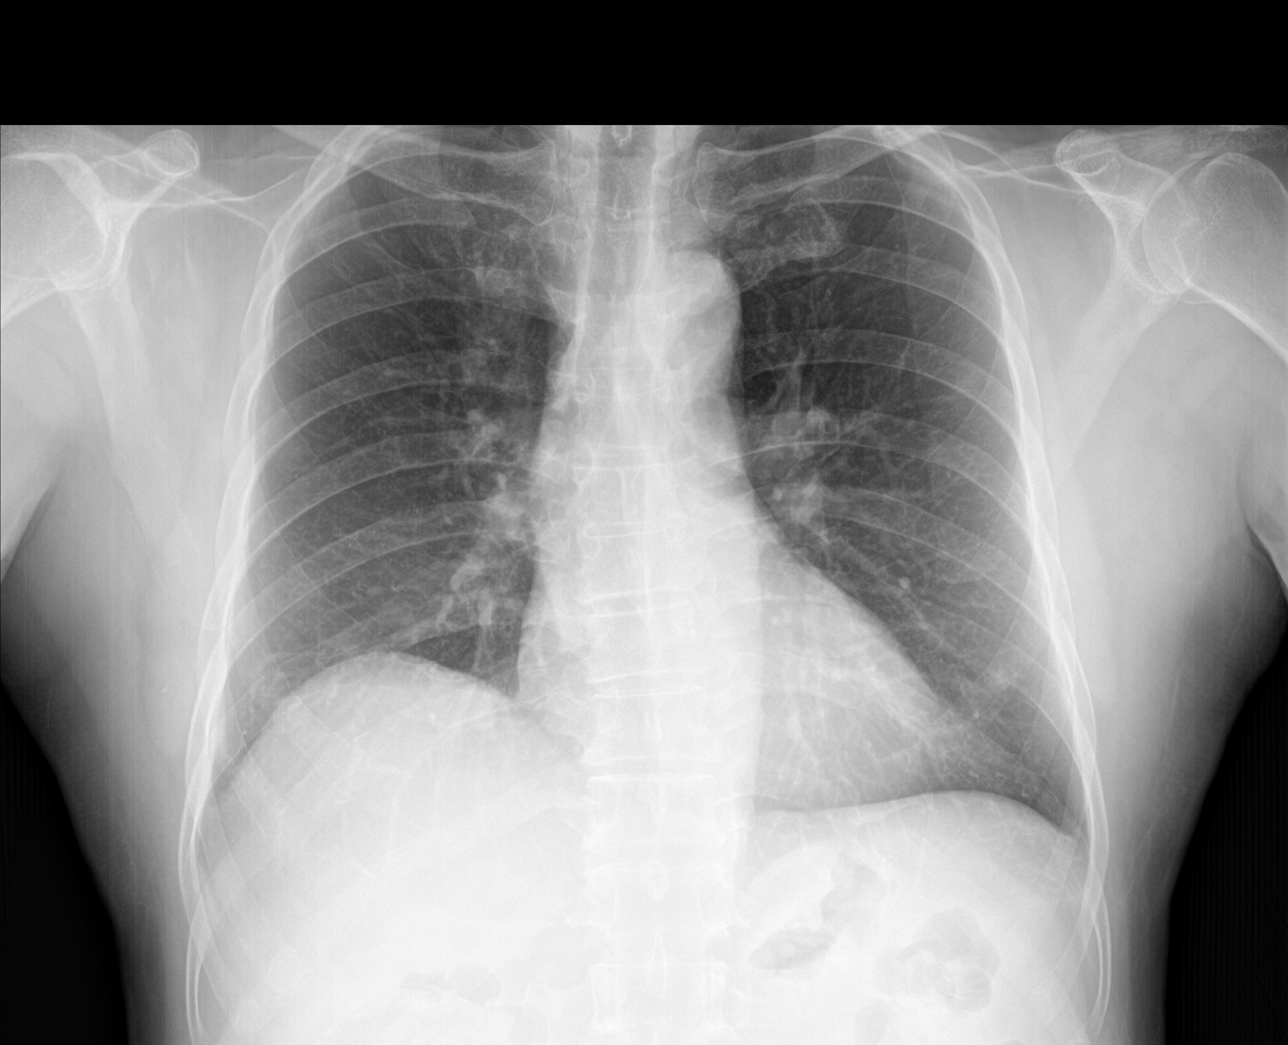

[abdomen erect (1 of 2)]
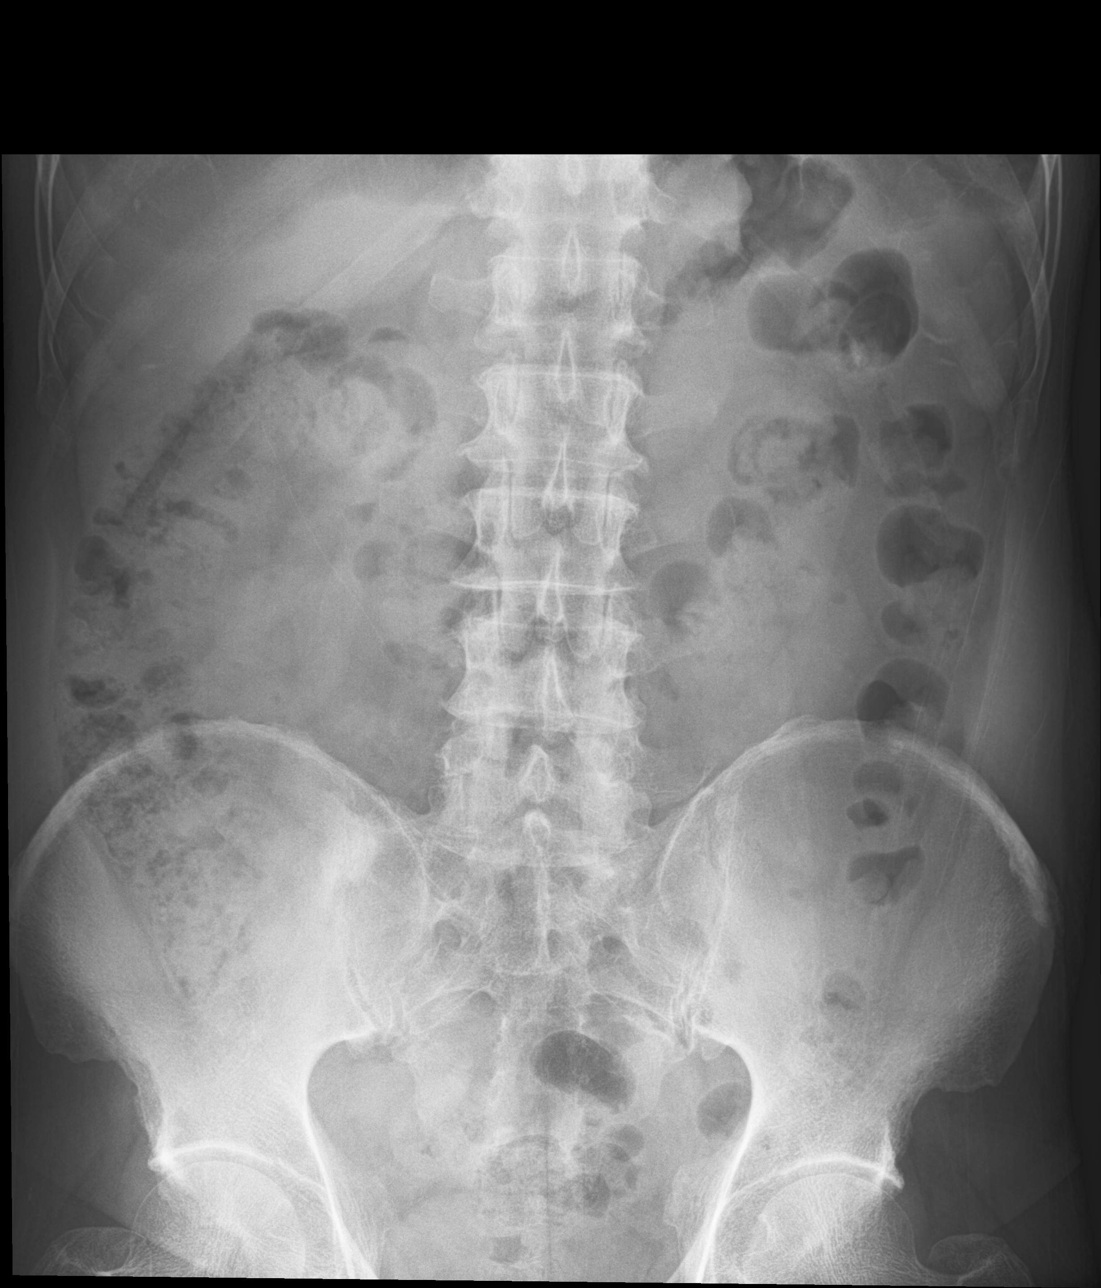

[abdomen supine]
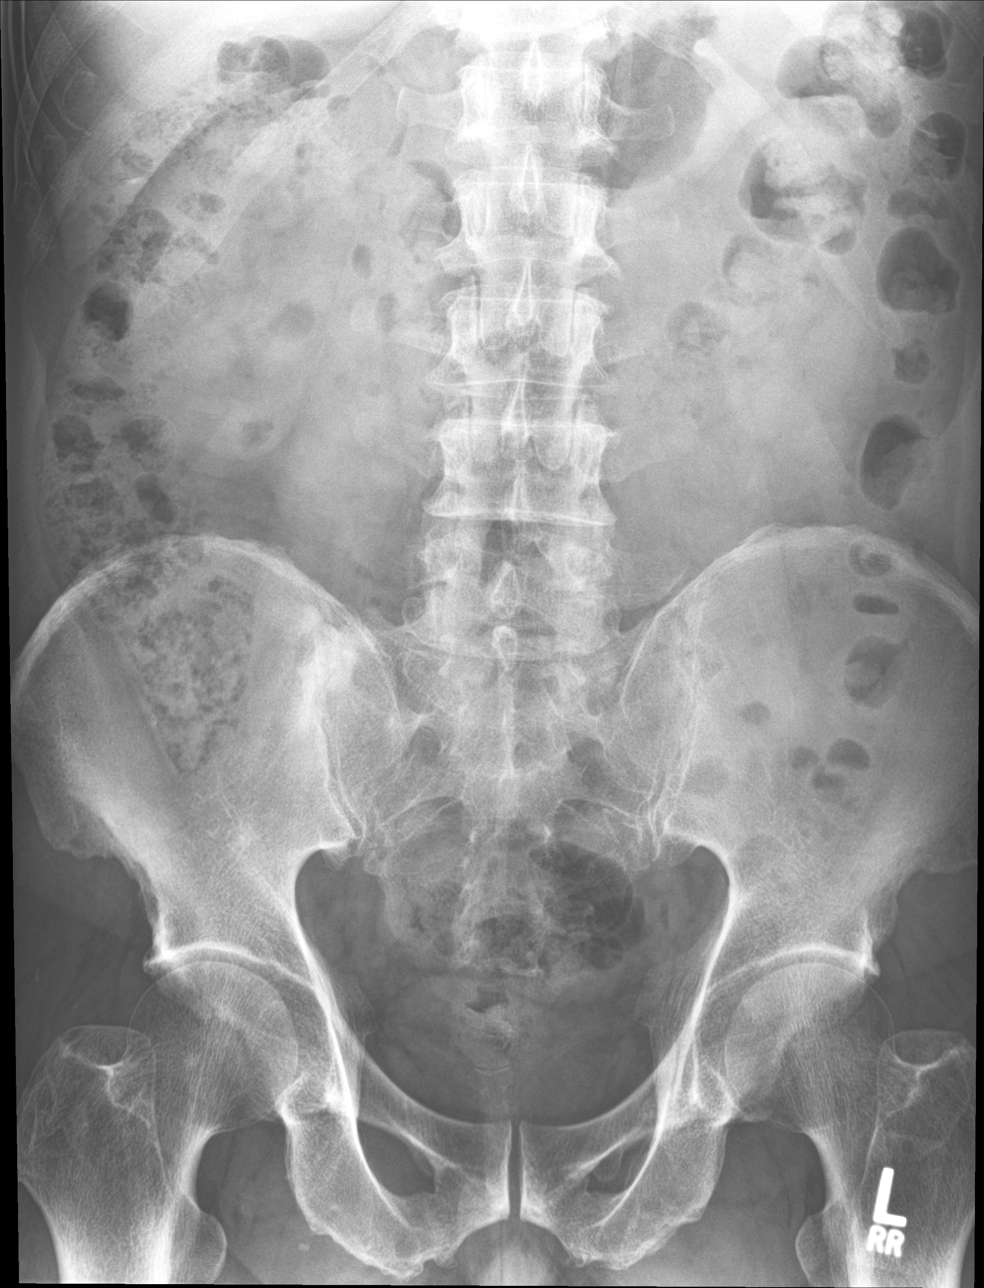

[abdomen erect (2 of 2)]
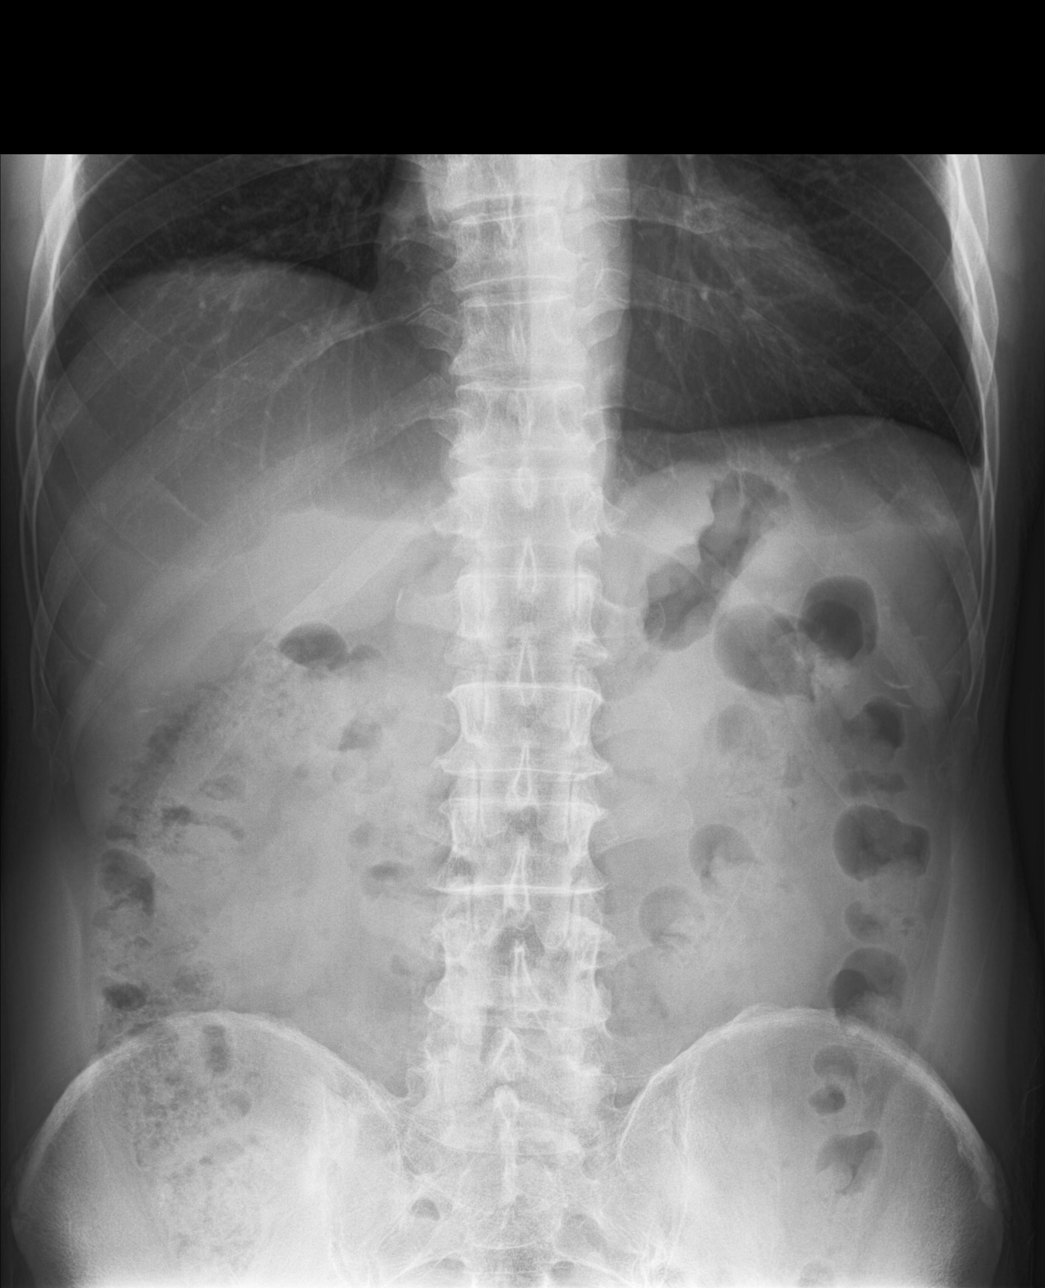

[4 of 4 positions shown; findings below may reference images not displayed]

FINDINGS: Mild elevation the right diaphragm, stable. There is no edema,
consolidation, effusion, or pneumothorax. Normal heart size and
mediastinal contours.

Normal bowel gas pattern. Moderate stool throughout the colon
without obstruction or rectal impaction. No concerning mass effect
or calcification.
IMPRESSION: 1. Moderate stool volume without obstruction or rectal impaction.
2. Stable, negative chest.

## 2020-06-07 IMAGING — DX CHEST - 2 VIEW
2 series · 2 of 2 positions shown · non-contrast
Comparison: 02/09/2018

CLINICAL DATA: Per pt: started getting sick yesterday with dry
cough, no fever, when coughing gets SOB. No out of country travel,
hasn't knowingly been around anybody sick. History of acute Asthma
when cold weather. No history of cardiac disease. HBP, takes
medication. No diabetes. Non smoker

EXAM:
CHEST - 2 VIEW

[chest pa]
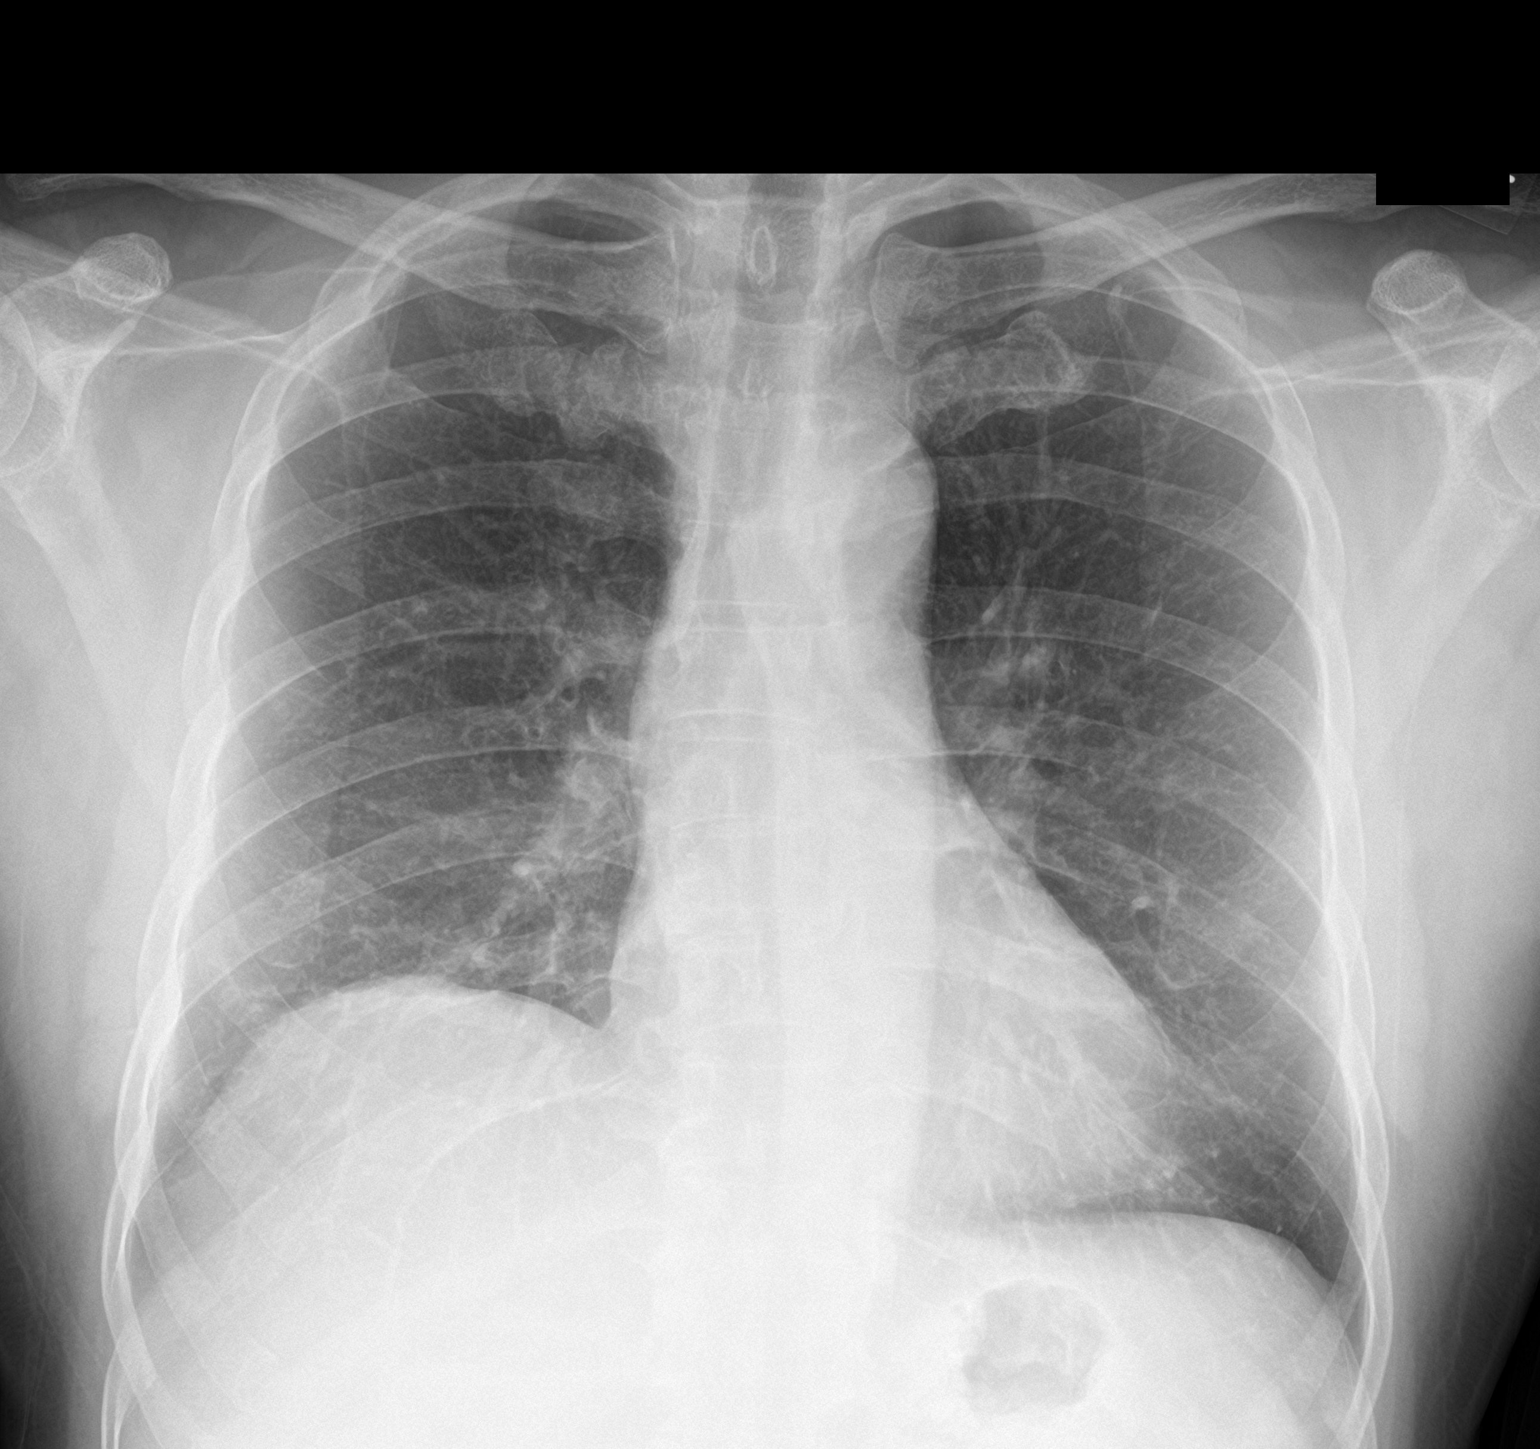

[chest lat]
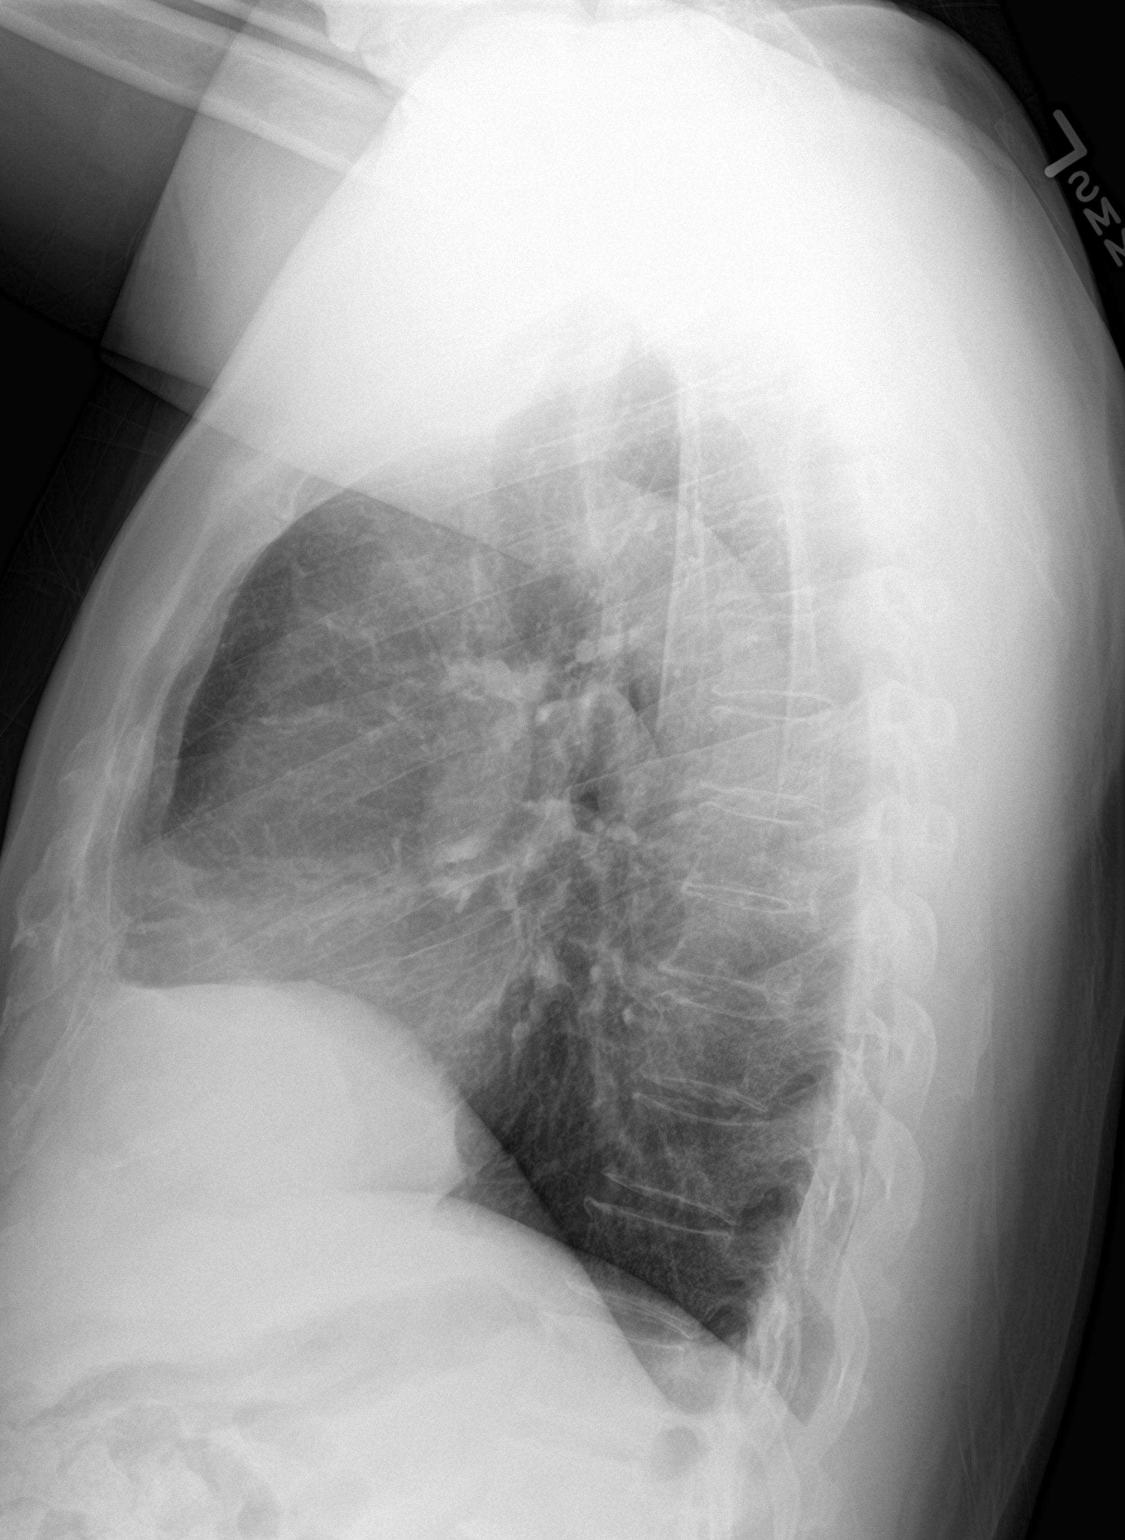

[2 of 2 positions shown; findings below may reference images not displayed]

FINDINGS: Cardiac silhouette is normal in size and configuration. No
mediastinal or hilar masses. No evidence of adenopathy.

Lungs are clear.  No pleural effusion or pneumothorax.

Skeletal structures are intact.
IMPRESSION: No active cardiopulmonary disease.

## 2020-06-25 DIAGNOSIS — Z1211 Encounter for screening for malignant neoplasm of colon: Secondary | ICD-10-CM | POA: Insufficient documentation

## 2020-06-25 DIAGNOSIS — R112 Nausea with vomiting, unspecified: Secondary | ICD-10-CM | POA: Insufficient documentation

## 2020-06-25 DIAGNOSIS — A048 Other specified bacterial intestinal infections: Secondary | ICD-10-CM

## 2020-06-25 DIAGNOSIS — R1013 Epigastric pain: Secondary | ICD-10-CM

## 2020-06-25 DIAGNOSIS — J45909 Unspecified asthma, uncomplicated: Secondary | ICD-10-CM | POA: Insufficient documentation

## 2020-06-25 DIAGNOSIS — J4541 Moderate persistent asthma with (acute) exacerbation: Secondary | ICD-10-CM | POA: Insufficient documentation

## 2020-06-25 HISTORY — DX: Encounter for screening for malignant neoplasm of colon: Z12.11

## 2020-06-25 HISTORY — DX: Nausea with vomiting, unspecified: R11.2

## 2020-06-25 HISTORY — DX: Other specified bacterial intestinal infections: A04.8

## 2020-06-25 HISTORY — DX: Epigastric pain: R10.13

## 2020-07-21 ENCOUNTER — Other Ambulatory Visit: Payer: Self-pay

## 2020-07-21 ENCOUNTER — Encounter (HOSPITAL_COMMUNITY): Payer: Self-pay | Admitting: Emergency Medicine

## 2020-07-21 ENCOUNTER — Ambulatory Visit (HOSPITAL_COMMUNITY)
Admission: EM | Admit: 2020-07-21 | Discharge: 2020-07-21 | Disposition: A | Discharge status: Home / Self Care | Payer: BC Managed Care – PPO | Attending: Student | Admitting: Student

## 2020-07-21 DIAGNOSIS — M5432 Sciatica, left side: Secondary | ICD-10-CM

## 2020-07-21 DIAGNOSIS — I10 Essential (primary) hypertension: Secondary | ICD-10-CM | POA: Diagnosis not present

## 2020-07-21 DIAGNOSIS — M5136 Other intervertebral disc degeneration, lumbar region: Secondary | ICD-10-CM

## 2020-07-21 DIAGNOSIS — S39012A Strain of muscle, fascia and tendon of lower back, initial encounter: Secondary | ICD-10-CM

## 2020-07-21 DIAGNOSIS — Z789 Other specified health status: Secondary | ICD-10-CM

## 2020-07-21 HISTORY — DX: Disorder of thyroid, unspecified: E07.9

## 2020-07-21 MED ORDER — TIZANIDINE HCL 2 MG PO CAPS: 2.0000 mg | ORAL_CAPSULE | Freq: Three times a day (TID) | ORAL | 0 refills | Status: DC

## 2020-07-21 MED ORDER — PREDNISONE 20 MG PO TABS: 40.0000 mg | ORAL_TABLET | Freq: Every day | ORAL | 0 refills | Status: AC

## 2020-07-21 NOTE — ED Provider Notes (Signed)
[facility]    CSN: [id number] Arrival date & time: 07/21/20  1454      History   Chief Complaint Chief Complaint  Patient presents with  . Leg Pain    HPI Paul Edwards is a 65 y.o. male presenting for acute exacerbation of chronic back pain. Medical history cervical DDD with lumbar disc arthroplasty, GERD, hypertension, allergies, asthma. Notes 3 days of left-sided lower back pain with radiation down his leg.  Exacerbated by walking and standing.  Denies weakness or numbness in legs.  Denies trauma or overuse.  Has not been taking any medications at home for this.  Denies urinary symptoms. Denies numbness in arms/legs, denies weakness in arms/legs, denies saddle anesthesia, denies bowel/bladder incontinence.  He declines translation services in favor of translation by family member   HPI  Past Medical History:  Diagnosis Date  . Allergy   . Asthma   . GERD (gastroesophageal reflux disease)   . Hypertension   . Thyroid disease     Patient Active Problem List   Diagnosis Date Noted  . Urinary frequency 02/22/2018  . Lower urinary tract symptoms (LUTS) 02/22/2018  . Abdominal pain 02/09/2018  . Benign prostatic hyperplasia with nocturia 01/08/2017  . Degenerative disc disease, cervical 12/05/2016  . Gastroesophageal reflux disease without esophagitis 12/05/2016  . Essential hypertension 05/12/2008  . Cough 05/12/2008    Past Surgical History:  Procedure Laterality Date  . LUMBAR DISC ARTHROPLASTY     [facility]  . SPINE SURGERY         Home Medications    Prior to Admission medications   Medication Sig Start Date End Date Taking? Authorizing Provider  ADVAIR DISKUS 100-50 MCG/DOSE AEPB Inhale 1 puff into the lungs 2 (two) times daily. 07/25/19  Yes Bing Neighbors, FNP  albuterol (PROVENTIL HFA;VENTOLIN HFA) 108 (90 BASE) MCG/ACT inhaler Inhale 2 puffs into the lungs every 4 (four) hours as needed for wheezing or shortness of breath (cough, shortness  of breath or wheezing.). 03/11/15  Yes Dorna Leitz, PA-C  predniSONE (DELTASONE) 20 MG tablet Take 2 tablets (40 mg total) by mouth daily for 5 days. 07/21/20 07/26/20 Yes Rhys Martini, PA-C  tizanidine (ZANAFLEX) 2 MG capsule Take 1 capsule (2 mg total) by mouth 3 (three) times daily. 07/21/20  Yes Rhys Martini, PA-C  allopurinol (ZYLOPRIM) 300 MG tablet Take 300 mg by mouth daily. 01/21/19   [provider]  amLODipine (NORVASC) 5 MG tablet Take 5 mg by mouth daily. RAN OUT 3 MONTHS AGO 01/21/19   [provider]  amoxicillin-clavulanate (AUGMENTIN) 875-125 MG tablet Take 1 tablet by mouth 2 (two) times daily. 07/25/19   Bing Neighbors, FNP  levothyroxine (SYNTHROID) 50 MCG tablet Take 50 mcg by mouth daily. Patient not taking: Reported on 07/21/2020 10/29/18   [provider]  omeprazole (PRILOSEC) 40 MG capsule Take 1 capsule (40 mg total) by mouth daily. Patient not taking: Reported on 07/21/2020 02/09/18   Georgina Quint, MD  pantoprazole (PROTONIX) 40 MG tablet Take 1 tablet (40 mg total) by mouth daily. Patient not taking: Reported on 01/27/2019 10/10/16   Ethelda Chick, MD  ranitidine (ZANTAC) 300 MG tablet Take 1 tablet (300 mg total) by mouth at bedtime for 14 days. Patient not taking: Reported on 01/27/2019 02/09/18 02/23/18  Georgina Quint, MD  sucralfate (CARAFATE) 1 g tablet Take 1 tablet (1 g total) by mouth 4 (four) times daily -  with meals and at  bedtime. Patient not taking: Reported on 07/21/2020 01/27/19   Charlestine Night, PA-C  tamsulosin (FLOMAX) 0.4 MG CAPS capsule Take 1 capsule (0.4 mg total) by mouth daily. Patient not taking: Reported on 01/27/2019 02/22/18   Georgina Quint, MD  traMADol (ULTRAM) 50 MG tablet Take 1 tablet (50 mg total) by mouth every 6 (six) hours as needed for severe pain. 01/27/19   Charlestine Night, PA-C    Family History Family History  Problem Relation Age of Onset  . Heart disease  Sister 52       AMI one sister    Social History Social History   Tobacco Use  . Smoking status: Former Smoker    Years: 15.00  . Smokeless tobacco: Never Used  Vaping Use  . Vaping Use: Never used  Substance Use Topics  . Alcohol use: Yes    Alcohol/week: 0.0 standard drinks    Comment: occ  . Drug use: No     Allergies   Patient has no known allergies.   Review of Systems Review of Systems  Constitutional: Negative for chills, fever and unexpected weight change.  Respiratory: Negative for chest tightness and shortness of breath.   Cardiovascular: Negative for chest pain and palpitations.  Gastrointestinal: Negative for abdominal pain, diarrhea, nausea and vomiting.  Genitourinary: Negative for decreased urine volume, difficulty urinating and frequency.  Musculoskeletal: Positive for back pain. Negative for arthralgias, gait problem, joint swelling, myalgias, neck pain and neck stiffness.  Skin: Negative for wound.  Neurological: Negative for dizziness, tremors, seizures, syncope, facial asymmetry, speech difficulty, weakness, light-headedness, numbness and headaches.  All other systems reviewed and are negative.    Physical Exam Triage Vital Signs ED Triage Vitals  Enc Vitals Group     BP      Pulse      Resp      Temp      Temp src      SpO2      Weight      Height      Head Circumference      Peak Flow      Pain Score      Pain Loc      Pain Edu?      Excl. in GC?    No data found.  Updated Vital Signs BP (!) 149/98 (BP Location: Right Arm)   Pulse 70   Temp 97.9 F (36.6 C) (Oral)   Resp 18   SpO2 100%   Visual Acuity Right Eye Distance:   Left Eye Distance:   Bilateral Distance:    Right Eye Near:   Left Eye Near:    Bilateral Near:     Physical Exam Vitals reviewed.  Constitutional:      General: He is not in acute distress.    Appearance: Normal appearance. He is not ill-appearing.  HENT:     Head: Normocephalic and  atraumatic.  Eyes:     Extraocular Movements: Extraocular movements intact.     Pupils: Pupils are equal, round, and reactive to light.  Cardiovascular:     Rate and Rhythm: Normal rate and regular rhythm.     Heart sounds: Normal heart sounds.  Pulmonary:     Effort: Pulmonary effort is normal.     Breath sounds: Normal breath sounds and air entry.  Abdominal:     Palpations: Abdomen is soft.     Tenderness: There is no abdominal tenderness. There is no right CVA tenderness, left CVA tenderness, guarding  or rebound.     Comments: No bowel or bladder incontinence.  Musculoskeletal:     Cervical back: Normal range of motion. No swelling, deformity, signs of trauma, rigidity, spasms, tenderness, bony tenderness or crepitus. No pain with movement.     Thoracic back: No swelling, deformity, signs of trauma, spasms, tenderness or bony tenderness. Normal range of motion. No scoliosis.     Lumbar back: Spasms present. No swelling, deformity, signs of trauma, tenderness or bony tenderness. Normal range of motion. Negative right straight leg raise test and negative left straight leg raise test. No scoliosis.     Comments: Left-sided paraspinous muscle tenderness to palpation.  Pain elicited with flexion of lumbar spine.  Gait intact but back pain with ambulating.  Positive straight leg raise left side.  Strength and sensation intact in upper and lower extremities.  No spinous tenderness, deformity or step-off.  Absolutely no other injury, deformity, tenderness, ecchymosis, abrasion.  Skin:    Capillary Refill: Capillary refill takes less than 2 seconds.  Neurological:     General: No focal deficit present.     Mental Status: He is alert and oriented to person, place, and time.     Cranial Nerves: No cranial nerve deficit.  Psychiatric:        Mood and Affect: Mood normal.        Behavior: Behavior normal.        Thought Content: Thought content normal.        Judgment: Judgment normal.       UC Treatments / Results  Labs (all labs ordered are listed, but only abnormal results are displayed) Labs Reviewed - No data to display  EKG   Radiology No results found.  Procedures Procedures (including critical care time)  Medications Ordered in UC Medications - No data to display  Initial Impression / Assessment and Plan / UC Course  I have reviewed the triage vital signs and the nursing notes.  Pertinent labs & imaging results that were available during my care of the patient were reviewed by me and considered in my medical decision making (see chart for details).     This patient is a 65 year old male presenting with acute exacerbation of chronic back pain. No red flag symptoms.   This patient is currently not taking any of his prescribed medications; however, blood pressure is fairly well controlled despite this.  Follow-up with primary care to restart medications.  Monitor BP at home.  Prednisone and Zanaflex sent as below.  He is not a diabetic.  ED return precautions discussed.   Final Clinical Impressions(s) / UC Diagnoses   Final diagnoses:  Strain of lumbar region, initial encounter  Sciatica of left side  DDD (degenerative disc disease), lumbar  Essential hypertension  Language barrier     Discharge Instructions     -Prednisone 2 pills taken at the same time for 5 days in a row. -Start the muscle relaxer-Zanaflex (tizanidine), up to 3 times daily for muscle spasms and pain.  This can make you drowsy, so take at bedtime or when you do not need to drive or operate machinery. -Please check your blood pressure at home or at the pharmacy. If this continues to be >140/90, follow-up with your primary care provider for further blood pressure management/ medication titration. If you develop chest pain, shortness of breath, vision changes, the worst headache of your life- head straight to the ED or call 911. -Consider establishing care with primary care  provider for  further management of your chronic conditions.     ED Prescriptions    Medication Sig Dispense Auth. Provider   predniSONE (DELTASONE) 20 MG tablet Take 2 tablets (40 mg total) by mouth daily for 5 days. 10 tablet Rhys MartiniGraham, Waldemar Siegel E, PA-C   tizanidine (ZANAFLEX) 2 MG capsule Take 1 capsule (2 mg total) by mouth 3 (three) times daily. 21 capsule Rhys MartiniGraham, Mani Celestin E, PA-C     PDMP not reviewed this encounter.   Rhys MartiniGraham, Daney Moor E, PA-C 07/21/20 1628

## 2020-07-21 NOTE — Discharge Instructions (Addendum)
-  Prednisone 2 pills taken at the same time for 5 days in a row. -Start the muscle relaxer-Zanaflex (tizanidine), up to 3 times daily for muscle spasms and pain.  This can make you drowsy, so take at bedtime or when you do not need to drive or operate machinery. -Please check your blood pressure at home or at the pharmacy. If this continues to be >140/90, follow-up with your primary care provider for further blood pressure management/ medication titration. If you develop chest pain, shortness of breath, vision changes, the worst headache of your life- head straight to the ED or call 911. -Consider establishing care with primary care provider for further management of your chronic conditions.

## 2020-07-21 NOTE — ED Triage Notes (Addendum)
This pain started 2-3 days of left hip and left leg pain.  No known recent injury.  2-3 years ago had similar pain, no explanation   PATIENT IS NOT TAKING ANY PRESCRIBED MEDICINES

## 2020-07-28 ENCOUNTER — Ambulatory Visit (HOSPITAL_COMMUNITY)
Admission: EM | Admit: 2020-07-28 | Discharge: 2020-07-28 | Disposition: A | Discharge status: Home / Self Care | Payer: Medicare Other | Attending: Physician Assistant | Admitting: Physician Assistant

## 2020-07-28 ENCOUNTER — Encounter (HOSPITAL_COMMUNITY): Payer: Self-pay

## 2020-07-28 ENCOUNTER — Other Ambulatory Visit: Payer: Self-pay

## 2020-07-28 ENCOUNTER — Ambulatory Visit (INDEPENDENT_AMBULATORY_CARE_PROVIDER_SITE_OTHER): Payer: Medicare Other

## 2020-07-28 DIAGNOSIS — I10 Essential (primary) hypertension: Secondary | ICD-10-CM | POA: Diagnosis present

## 2020-07-28 DIAGNOSIS — M25552 Pain in left hip: Secondary | ICD-10-CM

## 2020-07-28 DIAGNOSIS — M545 Low back pain, unspecified: Secondary | ICD-10-CM | POA: Insufficient documentation

## 2020-07-28 DIAGNOSIS — M79605 Pain in left leg: Secondary | ICD-10-CM

## 2020-07-28 DIAGNOSIS — M5410 Radiculopathy, site unspecified: Secondary | ICD-10-CM | POA: Insufficient documentation

## 2020-07-28 LAB — CBC
HCT: 46.5 % (ref 39.0–52.0)
Hemoglobin: 15.2 g/dL (ref 13.0–17.0)
MCH: 26.7 pg (ref 26.0–34.0)
MCHC: 32.7 g/dL (ref 30.0–36.0)
MCV: 81.7 fL (ref 80.0–100.0)
Platelets: 213 10*3/uL (ref 150–400)
RBC: 5.69 MIL/uL (ref 4.22–5.81)
RDW: 12.9 % (ref 11.5–15.5)
WBC: 10.6 10*3/uL — ABNORMAL HIGH (ref 4.0–10.5)
nRBC: 0 % (ref 0.0–0.2)

## 2020-07-28 LAB — COMPREHENSIVE METABOLIC PANEL
ALT: 73 U/L — ABNORMAL HIGH (ref 0–44)
AST: 50 U/L — ABNORMAL HIGH (ref 15–41)
Albumin: 3.8 g/dL (ref 3.5–5.0)
Alkaline Phosphatase: 74 U/L (ref 38–126)
Anion gap: 6 (ref 5–15)
BUN: 17 mg/dL (ref 8–23)
CO2: 29 mmol/L (ref 22–32)
Calcium: 9 mg/dL (ref 8.9–10.3)
Chloride: 103 mmol/L (ref 98–111)
Creatinine, Ser: 0.97 mg/dL (ref 0.61–1.24)
GFR, Estimated: >60 mL/min (ref >60)
Glucose, Bld: 80 mg/dL (ref 70–99)
Potassium: 3.8 mmol/L (ref 3.5–5.1)
Sodium: 138 mmol/L (ref 135–145)
Total Bilirubin: 0.6 mg/dL (ref 0.3–1.2)
Total Protein: 6.7 g/dL (ref 6.5–8.1)

## 2020-07-28 MED ORDER — GABAPENTIN 100 MG PO CAPS: 100.0000 mg | ORAL_CAPSULE | Freq: Every day | ORAL | 0 refills | Status: DC

## 2020-07-28 MED ORDER — BACLOFEN 10 MG PO TABS
10.0000 mg | ORAL_TABLET | Freq: Every evening | ORAL | 0 refills | Status: DC | PRN
Start: 2020-07-28 — End: 2020-09-04

## 2020-07-28 MED ORDER — AMLODIPINE BESYLATE 5 MG PO TABS: 5.0000 mg | ORAL_TABLET | Freq: Every day | ORAL | 0 refills | Status: DC

## 2020-07-28 NOTE — ED Triage Notes (Signed)
Pt in with c/o left hip and leg pain that has been going on for over 1 week   Pt states he was seen last week, but his pain is not getting better

## 2020-07-28 NOTE — Discharge Instructions (Addendum)
Take baclofen and gabapentin at night as these can cause you to be sleepy.  Do not drive or drink alcohol with this medication as drowsiness is common.  I have called in a refill of your amlodipine but you need to follow-up with your primary care.  Please monitor your blood pressure at home.  If you have any severe headache, chest pain, shortness of breath, vision changes you need to go to the emergency room.  It is important that you establish with a primary care doctor as you will likely need an MRI of your back which is not something we can arrange.  If you have any worsening symptoms please come back to see Paul Edwards.

## 2020-07-28 NOTE — ED Provider Notes (Signed)
[facility]    CSN: [id number] Arrival date & time: 07/28/20  9562      History   Chief Complaint Chief Complaint  Patient presents with  . Leg Pain    HPI Paul Edwards is a 65 y.o. male.   Patient presented with several week history of worsening left sided lower back pain.  He denies new injury or increased activity prior to symptom onset.  He was seen by clinic 07/21/2020 which when he was given prednisone burst and Zanaflex.  He reports using medication as prescribed without improvement of symptoms.  Pain is rated 6 on a 0-10 pain scale, localized to left lumbar back without radiation, described as aching with periodic sharp pains, worse with lying flat, no alleviating factors identified.  He has tried over-the-counter analgesics and heat without improvement of symptoms.  He reports previous back pain requiring surgery approximately 4 years ago.  He denies any bowel/bladder incontinence, lower extremity weakness, saddle anesthesia.  He denies any recent spinal procedures or history of malignancy.     Past Medical History:  Diagnosis Date  . Allergy   . Asthma   . GERD (gastroesophageal reflux disease)   . Hypertension   . Thyroid disease     Patient Active Problem List   Diagnosis Date Noted  . Urinary frequency 02/22/2018  . Lower urinary tract symptoms (LUTS) 02/22/2018  . Abdominal pain 02/09/2018  . Benign prostatic hyperplasia with nocturia 01/08/2017  . Degenerative disc disease, cervical 12/05/2016  . Gastroesophageal reflux disease without esophagitis 12/05/2016  . Essential hypertension 05/12/2008  . Cough 05/12/2008    Past Surgical History:  Procedure Laterality Date  . LUMBAR DISC ARTHROPLASTY     [facility]  . SPINE SURGERY         Home Medications    Prior to Admission medications   Medication Sig Start Date End Date Taking? Authorizing Provider  ADVAIR DISKUS 100-50 MCG/DOSE AEPB Inhale 1 puff into the lungs 2 (two) times daily.  07/25/19   Bing Neighbors, FNP  albuterol (PROVENTIL HFA;VENTOLIN HFA) 108 (90 BASE) MCG/ACT inhaler Inhale 2 puffs into the lungs every 4 (four) hours as needed for wheezing or shortness of breath (cough, shortness of breath or wheezing.). 03/11/15   Dorna Leitz, PA-C  allopurinol (ZYLOPRIM) 300 MG tablet Take 300 mg by mouth daily. 01/21/19   [provider]  amLODipine (NORVASC) 5 MG tablet Take 5 mg by mouth daily. RAN OUT 3 MONTHS AGO 01/21/19   [provider]  amoxicillin-clavulanate (AUGMENTIN) 875-125 MG tablet Take 1 tablet by mouth 2 (two) times daily. 07/25/19   Bing Neighbors, FNP  levothyroxine (SYNTHROID) 50 MCG tablet Take 50 mcg by mouth daily. Patient not taking: Reported on 07/21/2020 10/29/18   [provider]  omeprazole (PRILOSEC) 40 MG capsule Take 1 capsule (40 mg total) by mouth daily. Patient not taking: Reported on 07/21/2020 02/09/18   Georgina Quint, MD  pantoprazole (PROTONIX) 40 MG tablet Take 1 tablet (40 mg total) by mouth daily. Patient not taking: Reported on 01/27/2019 10/10/16   Ethelda Chick, MD  ranitidine (ZANTAC) 300 MG tablet Take 1 tablet (300 mg total) by mouth at bedtime for 14 days. Patient not taking: Reported on 01/27/2019 02/09/18 02/23/18  Georgina Quint, MD  sucralfate (CARAFATE) 1 g tablet Take 1 tablet (1 g total) by mouth 4 (four) times daily -  with meals and at bedtime. Patient not taking: Reported on 07/21/2020 01/27/19  Lawyer, Cristal Deerhristopher, PA-C  tamsulosin (FLOMAX) 0.4 MG CAPS capsule Take 1 capsule (0.4 mg total) by mouth daily. Patient not taking: Reported on 01/27/2019 02/22/18   Georgina QuintSagardia, Miguel Jose, MD  tizanidine (ZANAFLEX) 2 MG capsule Take 1 capsule (2 mg total) by mouth 3 (three) times daily. 07/21/20   Rhys MartiniGraham, Laura E, PA-C  traMADol (ULTRAM) 50 MG tablet Take 1 tablet (50 mg total) by mouth every 6 (six) hours as needed for severe pain. 01/27/19   Charlestine NightLawyer, Christopher, PA-C    Family  History Family History  Problem Relation Age of Onset  . Heart disease Sister [id number]       AMI one sister    Social History Social History   Tobacco Use  . Smoking status: Former Smoker    Years: 15.00  . Smokeless tobacco: Never Used  Vaping Use  . Vaping Use: Never used  Substance Use Topics  . Alcohol use: Yes    Alcohol/week: 0.0 standard drinks    Comment: occ  . Drug use: No     Allergies   Patient has no known allergies.   Review of Systems Review of Systems  Constitutional: Positive for activity change. Negative for appetite change, fatigue and fever.  Respiratory: Negative for cough and shortness of breath.   Cardiovascular: Negative for chest pain.  Gastrointestinal: Negative for abdominal pain, diarrhea, nausea and vomiting.  Musculoskeletal: Positive for arthralgias and back pain. Negative for myalgias.  Neurological: Negative for dizziness, weakness, light-headedness, numbness and headaches.     Physical Exam Triage Vital Signs ED Triage Vitals  Enc Vitals Group     BP 07/28/20 0935 (!) 144/72     Pulse Rate 07/28/20 0935 71     Resp 07/28/20 0935 19     Temp 07/28/20 0935 98.1 F (36.7 C)     Temp src --      SpO2 07/28/20 0935 99 %     Weight --      Height --      Head Circumference --      Peak Flow --      Pain Score 07/28/20 0934 6     Pain Loc --      Pain Edu? --      Excl. in GC? --    No data found.  Updated Vital Signs BP (!) 144/72   Pulse 71   Temp 98.1 F (36.7 C)   Resp 19   SpO2 99%   Visual Acuity Right Eye Distance:   Left Eye Distance:   Bilateral Distance:    Right Eye Near:   Left Eye Near:    Bilateral Near:     Physical Exam Vitals reviewed.  Constitutional:      General: He is awake.     Appearance: Normal appearance. He is normal weight. He is not ill-appearing.     Comments: Very pleasant male appears stated age in no acute distress  HENT:     Head: Normocephalic and atraumatic.  Cardiovascular:      Rate and Rhythm: Normal rate and regular rhythm.     Pulses:          Posterior tibial pulses are 2+ on the right side and 2+ on the left side.     Heart sounds: No murmur heard.   Pulmonary:     Effort: Pulmonary effort is normal.     Breath sounds: Normal breath sounds. No stridor. No wheezing, rhonchi or rales.     Comments:  Clear to auscultation bilaterally Musculoskeletal:     Cervical back: No tenderness or bony tenderness.     Thoracic back: No tenderness or bony tenderness.     Lumbar back: Tenderness and bony tenderness present. No deformity. Decreased range of motion. Positive left straight leg raise test. Negative right straight leg raise test.     Right lower leg: No edema.     Left lower leg: No edema.     Comments: Back: Pain with percussion of lumbar vertebrae L3-L5.  Tenderness palpation of left lumbar paraspinal muscles.  Positive straight leg raise on left.  Decreased range of motion with flexion, rotation, extension.  Antalgic gait.   Skin:    General: Skin is warm.  Neurological:     Mental Status: He is alert.     Deep Tendon Reflexes:     Reflex Scores:      Patellar reflexes are 2+ on the right side and 2+ on the left side. Psychiatric:        Behavior: Behavior is cooperative.      UC Treatments / Results  Labs (all labs ordered are listed, but only abnormal results are displayed) Labs Reviewed - No data to display  EKG   Radiology DG Lumbar Spine Complete  Result Date: 07/28/2020 CLINICAL DATA:  Left hip and leg pain for the past week. EXAM: LUMBAR SPINE - COMPLETE 4+ VIEW COMPARISON:  CT abdomen pelvis dated January 27, 2019. FINDINGS: Five lumbar type vertebral bodies. No acute fracture or subluxation. Vertebral body heights are preserved. Alignment is normal. Unchanged multilevel mild anterior endplate spurring and mild disc height loss at L4-L5. IMPRESSION: 1. Unchanged mild lumbar spondylosis.  No acute osseous abnormality. Electronically  Signed   By: Obie Dredge M.D.   On: 07/28/2020 10:43    Procedures Procedures (including critical care time)  Medications Ordered in UC Medications - No data to display  Initial Impression / Assessment and Plan / UC Course  I have reviewed the triage vital signs and the nursing notes.  Pertinent labs & imaging results that were available during my care of the patient were reviewed by me and considered in my medical decision making (see chart for details).     X-ray showed degenerative changes without acute abnormality.  Patient reports continued pain and so we discussed potential need for MRI for further evaluation but this is not something we can arrange so we will try to establish patient with PCP through PCP assistance.  Will prescribe gabapentin 100 mg at night to help with radiculopathy.  Will transition to baclofen 10 mg in the hopes of better symptom control.  Patient instructed not to drive or drink alcohol with either of these medications as drowsiness is a common side effect.  He was encouraged to use heat, rest, stretch for additional symptom relief.  Strict return precautions given to which patient expressed understanding.  Patient's blood pressure remains elevated and he reports being out of amlodipine.  Refill was provided as requested.  Basic labs obtained today-results pending.  He denies any chest pain, shortness of breath, peripheral edema, headache, visual disturbance.  He was encouraged to monitor his blood pressure at home and keep a log for evaluation of follow-up appointment.  We will establish patient with PCP through PCP assistance.  Strict return precautions given to which patient expressed understanding.  Final Clinical Impressions(s) / UC Diagnoses   Final diagnoses:  None   Discharge Instructions   None    ED Prescriptions  None     PDMP not reviewed this encounter.   Jeani Hawking, PA-C 07/28/20 1107

## 2020-07-29 ENCOUNTER — Telehealth (HOSPITAL_BASED_OUTPATIENT_CLINIC_OR_DEPARTMENT_OTHER): Payer: Self-pay

## 2020-07-29 NOTE — Telephone Encounter (Signed)
-----   Message from Aaron Edelman, RN sent at 07/29/2020  3:36 PM EDT ----- Regarding: UC to PCP Patient needs to establish with PCP - routine

## 2020-07-30 ENCOUNTER — Other Ambulatory Visit (HOSPITAL_BASED_OUTPATIENT_CLINIC_OR_DEPARTMENT_OTHER): Payer: Self-pay | Admitting: Nurse Practitioner

## 2020-07-30 ENCOUNTER — Encounter (HOSPITAL_BASED_OUTPATIENT_CLINIC_OR_DEPARTMENT_OTHER): Payer: Self-pay | Admitting: Nurse Practitioner

## 2020-07-31 ENCOUNTER — Other Ambulatory Visit: Payer: Self-pay

## 2020-07-31 ENCOUNTER — Encounter (HOSPITAL_BASED_OUTPATIENT_CLINIC_OR_DEPARTMENT_OTHER): Payer: Self-pay | Admitting: Nurse Practitioner

## 2020-07-31 ENCOUNTER — Ambulatory Visit (INDEPENDENT_AMBULATORY_CARE_PROVIDER_SITE_OTHER): Payer: Medicare Other | Admitting: Nurse Practitioner

## 2020-07-31 ENCOUNTER — Other Ambulatory Visit (HOSPITAL_BASED_OUTPATIENT_CLINIC_OR_DEPARTMENT_OTHER)
Admission: RE | Admit: 2020-07-31 | Discharge: 2020-07-31 | Disposition: A | Discharge status: Home / Self Care | Payer: Medicare Other | Source: Ambulatory Visit | Attending: Nurse Practitioner | Admitting: Nurse Practitioner

## 2020-07-31 VITALS — BP 146/87 | HR 65 | Ht 68.0 in | Wt 174.2 lb

## 2020-07-31 DIAGNOSIS — R112 Nausea with vomiting, unspecified: Secondary | ICD-10-CM | POA: Diagnosis present

## 2020-07-31 DIAGNOSIS — Z7689 Persons encountering health services in other specified circumstances: Secondary | ICD-10-CM | POA: Diagnosis not present

## 2020-07-31 DIAGNOSIS — Z0000 Encounter for general adult medical examination without abnormal findings: Secondary | ICD-10-CM | POA: Diagnosis present

## 2020-07-31 DIAGNOSIS — Z6826 Body mass index (BMI) 26.0-26.9, adult: Secondary | ICD-10-CM | POA: Insufficient documentation

## 2020-07-31 DIAGNOSIS — I10 Essential (primary) hypertension: Secondary | ICD-10-CM | POA: Diagnosis present

## 2020-07-31 DIAGNOSIS — G5702 Lesion of sciatic nerve, left lower limb: Secondary | ICD-10-CM | POA: Insufficient documentation

## 2020-07-31 DIAGNOSIS — I700 Atherosclerosis of aorta: Secondary | ICD-10-CM | POA: Diagnosis present

## 2020-07-31 DIAGNOSIS — E119 Type 2 diabetes mellitus without complications: Secondary | ICD-10-CM | POA: Insufficient documentation

## 2020-07-31 DIAGNOSIS — E039 Hypothyroidism, unspecified: Secondary | ICD-10-CM | POA: Insufficient documentation

## 2020-07-31 DIAGNOSIS — Z0001 Encounter for general adult medical examination with abnormal findings: Secondary | ICD-10-CM | POA: Diagnosis not present

## 2020-07-31 DIAGNOSIS — E781 Pure hyperglyceridemia: Secondary | ICD-10-CM | POA: Insufficient documentation

## 2020-07-31 HISTORY — DX: Lesion of sciatic nerve, left lower limb: G57.02

## 2020-07-31 HISTORY — DX: Body mass index (BMI) 26.0-26.9, adult: Z68.26

## 2020-07-31 HISTORY — DX: Persons encountering health services in other specified circumstances: Z76.89

## 2020-07-31 HISTORY — DX: Encounter for general adult medical examination without abnormal findings: Z00.00

## 2020-07-31 LAB — CBC WITH DIFFERENTIAL/PLATELET
Abs Immature Granulocytes: 0.18 10*3/uL — ABNORMAL HIGH (ref 0.00–0.07)
Basophils Absolute: 0.1 10*3/uL (ref 0.0–0.1)
Basophils Relative: 1 %
Eosinophils Absolute: 0.4 10*3/uL (ref 0.0–0.5)
Eosinophils Relative: 5 %
HCT: 46.7 % (ref 39.0–52.0)
Hemoglobin: 14.8 g/dL (ref 13.0–17.0)
Immature Granulocytes: 2 %
Lymphocytes Relative: 21 %
Lymphs Abs: 1.8 10*3/uL (ref 0.7–4.0)
MCH: 25.9 pg — ABNORMAL LOW (ref 26.0–34.0)
MCHC: 31.7 g/dL (ref 30.0–36.0)
MCV: 81.8 fL (ref 80.0–100.0)
Monocytes Absolute: 0.9 10*3/uL (ref 0.1–1.0)
Monocytes Relative: 10 %
Neutro Abs: 5.2 10*3/uL (ref 1.7–7.7)
Neutrophils Relative %: 61 %
Platelets: 193 10*3/uL (ref 150–400)
RBC: 5.71 MIL/uL (ref 4.22–5.81)
RDW: 12.8 % (ref 11.5–15.5)
WBC: 8.5 10*3/uL (ref 4.0–10.5)
nRBC: 0 % (ref 0.0–0.2)

## 2020-07-31 LAB — COMPREHENSIVE METABOLIC PANEL
ALT: 88 U/L — ABNORMAL HIGH (ref 0–44)
AST: 57 U/L — ABNORMAL HIGH (ref 15–41)
Albumin: 4.4 g/dL (ref 3.5–5.0)
Alkaline Phosphatase: 80 U/L (ref 38–126)
Anion gap: 8 (ref 5–15)
BUN: 21 mg/dL (ref 8–23)
CO2: 30 mmol/L (ref 22–32)
Calcium: 9.3 mg/dL (ref 8.9–10.3)
Chloride: 100 mmol/L (ref 98–111)
Creatinine, Ser: 0.89 mg/dL (ref 0.61–1.24)
GFR, Estimated: >60 mL/min (ref >60)
Glucose, Bld: 84 mg/dL (ref 70–99)
Potassium: 4 mmol/L (ref 3.5–5.1)
Sodium: 138 mmol/L (ref 135–145)
Total Bilirubin: 0.5 mg/dL (ref 0.3–1.2)
Total Protein: 7.5 g/dL (ref 6.5–8.1)

## 2020-07-31 LAB — LIPID PANEL
Cholesterol: 195 mg/dL (ref 0–200)
HDL: 40 mg/dL — ABNORMAL LOW (ref >40)
LDL Cholesterol: 78 mg/dL (ref 0–99)
Total CHOL/HDL Ratio: 4.9 RATIO
Triglycerides: 387 mg/dL — ABNORMAL HIGH (ref <150)
VLDL: 77 mg/dL — ABNORMAL HIGH (ref 0–40)

## 2020-07-31 LAB — HEMOGLOBIN A1C
Hgb A1c MFr Bld: 6.6 % — ABNORMAL HIGH (ref 4.8–5.6)
Mean Plasma Glucose: 142.72 mg/dL

## 2020-07-31 MED ORDER — PREDNISONE 50 MG PO TABS: 50.0000 mg | ORAL_TABLET | Freq: Every day | ORAL | 0 refills | Status: DC

## 2020-07-31 MED ORDER — DEXAMETHASONE SODIUM PHOSPHATE 10 MG/ML IJ SOLN
10.0000 mg | Freq: Once | INTRAMUSCULAR | Status: AC
  Administered 2020-07-31: 10 mg via INTRAMUSCULAR

## 2020-07-31 MED ORDER — KETOROLAC TROMETHAMINE 60 MG/2ML IM SOLN
60.0000 mg | Freq: Once | INTRAMUSCULAR | Status: AC
  Administered 2020-07-31: 60 mg via INTRAMUSCULAR

## 2020-07-31 NOTE — Assessment & Plan Note (Signed)
History of aortic atherosclerosis determined on previous imaging. Will obtain labs today to evaluate lipid levels and determine if medication is needed to help control and prevent worsening of this condition. Follow-up in 2 weeks.

## 2020-07-31 NOTE — Assessment & Plan Note (Signed)
Blood pressure is elevated today at 146/87.  He has been restarted on the amlodipine as of 2 days ago.  He is in considerable amount of pain today which is likely contributing to his elevated blood pressure readings.  Discussed with his daughter to monitor his blood pressures at home and document the findings. Blood pressure goals less than 130/80 for this patient. Patient will follow-up in approximately 2 weeks with blood pressure readings we will determine at that time if changes are needed to his medication regimen. Labs have been ordered today.

## 2020-07-31 NOTE — Assessment & Plan Note (Signed)
Symptoms and presentation consistent with piriformis syndrome of the left side. Steroid injection provided today along with a steroid burst of 50 mg for 5 days.  Handout provided for recommended exercises and detailed instructions provided to patient and his daughter.  Recommend alternating ice and heat for 20 minutes each at a time at least 3-4 times a day in addition to the exercises. Also recommended purchasing a TENS unit that may be helpful for pain relief. Discussed that he may continue with the gabapentin at bedtime particularly if this is helpful for him to sleep well the pain is most significant. If conservative measures at this time are not effective we will proceed with MRI.  Patient to follow-up in 2 weeks.

## 2020-07-31 NOTE — Assessment & Plan Note (Signed)
BMI 26.49 today. Patient appears to be well-nourished with no evidence of obesity. At this time no intervention is recommended for patient's current weight or BMI.

## 2020-07-31 NOTE — Assessment & Plan Note (Signed)
Chart review reveals a history of hypothyroidism with previous use of levothyroxine. He is not on this medication at this time and is unsure of this diagnosis. We will obtain labs today to evaluate further for this. Plan to make changes to plan of care based on lab results we will follow-up in 2 weeks.

## 2020-07-31 NOTE — Progress Notes (Signed)
New Patient Office Visit  Subjective:  Patient ID: Paul Edwards, male    DOB: 1955-04-23  Age: 65 y.o. MRN: [id number]  CC:  Chief Complaint  Patient presents with  . Establish Care  . Back Pain  . Hypertension    HPI Paul Edwards is a pleasant 65 year old male who presents today with his daughter, Paul Edwards, to establish care with this practice. He has not had primary care in quite some time. He has concerns today over bilateral low back pain with radiation into the left leg, longstanding high blood pressure, and an approximate 10-year history of emesis after eating.  BACK PAIN He endorses bilateral low back pain with radiation through the left buttock and down into the left thigh that has been going on for about 2 weeks.  He was seen recently by urgent care where x-rays were performed.  Degenerative changes with no acute abnormalities were noted on x-ray however straight leg raise was positive on physical exam.  He was given a prednisone burst and started on a muscle relaxer however his pain continued and he returned to the urgent care for reevaluation.  At his second visit he was started on gabapentin at night and baclofen.  It is mentioned in the report that he may need an MRI and would need to see primary care to have this performed.  Today he reports that his pain is still present at a 6-7/10.  He denies any bowel or bladder dysfunction present, lower extremity weakness, or saddle anesthesia.Paul Edwards  He endorses decreased range of motion when bending forward.   He does report that he had similar symptoms on the right side 4-5 years ago which were not relieved with conservative measures, including injections, and he had to undergo a surgical scraping procedure.  He is unclear of what the procedure was called for who performed the procedure.  He reports he has been using heat and the medications and has been doing some stretching but is still not having any relief of his pain.  He would like to see if  we can try steroids again today before going straight to the MRI.  HYPERTENSION He reports a longstanding history of hypertension where he is taking amlodipine.  He recently ran out of his amlodipine and this was refilled by the urgent care 2 days ago.  He requests refills on this today.  He denies any chest pain, shortness of breath, lower extremity edema, or dizziness.  EMESIS He reports an approximate 10-year history of emesis after eating most every meal.  He and his daughter state that he has been seen by several gastroenterologist for this condition and multiple tests have been run with nothing identified to be causing his symptoms.  He does report that he has been taking Prilosec which seemed to help a little bit but he stopped taking it daily and his symptoms have returned.  His daughter reports that the only period of relief that he has had from this was when he was being treated for H. pylori and on quadruple therapy.  She does report that he had a follow-up H. pylori test and it was negative.  He denies bloody emesis, diarrhea, blood in his stools, weight loss, increased fatigue, dizziness, or weakness.  There are no particular foods that seem to trigger him throwing up.  His daughter reports that he will eat something 2 days in a row and it may cause vomiting 1 time but not the other.  He reports  that rice and chicken are the only foods that do not make him throw up.  He denies use of excessive alcohol, spicy foods, foods high in acidity.  Past Medical History:  Diagnosis Date  . Allergy   . Asthma   . GERD (gastroesophageal reflux disease)   . Hypertension   . Thyroid disease     Past Surgical History:  Procedure Laterality Date  . LUMBAR DISC ARTHROPLASTY     [place]  . SPINE SURGERY      Family History  Problem Relation Age of Onset  . Heart disease Sister 77       AMI one sister    Social History   Socioeconomic History  . Marital status: Married    Spouse name: Not  on file  . Number of children: 3  . Years of education: Not on file  . Highest education level: Not on file  Occupational History  . Occupation: Location manager  Tobacco Use  . Smoking status: Former Smoker    Years: 15.00  . Smokeless tobacco: Never Used  Vaping Use  . Vaping Use: Never used  Substance and Sexual Activity  . Alcohol use: Yes    Alcohol/week: 0.0 standard drinks    Comment: occ  . Drug use: No  . Sexual activity: Yes  Other Topics Concern  . 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 [employer]      Tobacco: quit smoking 2008; smoked for 20 years      Alcohol: none      Exercise: walking some      Seatbelt: 100%   Social Determinants of Corporate investment banker Strain: Not on file  Food Insecurity: Not on file  Transportation Needs: Not on file  Physical Activity: Not on file  Stress: Not on file  Social Connections: Not on file  Intimate Partner Violence: Not on file    ROS Review of Systems See HPI for pertinent positives and negatives Objective:   Today's Vitals: BP (!) 146/87   Pulse 65   Ht  (1.727 m)   Wt 174 lb 3.2 oz (79 kg)   SpO2 100%   BMI 26.49 kg/m   Physical Exam Vitals and nursing note reviewed.  Constitutional:      General: He is not in acute distress.    Appearance: Normal appearance.  HENT:     Head: Normocephalic and atraumatic.     Right Ear: Tympanic membrane, ear canal and external ear normal.     Left Ear: Tympanic membrane and external ear normal.     Nose: Nose normal.     Mouth/Throat:     Mouth: Mucous membranes are moist.     Pharynx: Oropharynx is clear. No oropharyngeal exudate or posterior oropharyngeal erythema.  Eyes:     Extraocular Movements: Extraocular movements intact.      Conjunctiva/sclera: Conjunctivae normal.     Pupils: Pupils are equal, round, and reactive to light.  Neck:     Vascular: No carotid bruit.  Cardiovascular:     Rate and Rhythm: Normal rate and regular rhythm.     Pulses: Normal pulses.     Heart sounds: Normal heart sounds.  Pulmonary:  Effort: Pulmonary effort is normal.     Breath sounds: Normal breath sounds.  Abdominal:     General: Abdomen is flat. Bowel sounds are normal. There is no distension.     Palpations: Abdomen is soft.     Tenderness: There is no abdominal tenderness. There is no right CVA tenderness, left CVA tenderness, guarding or rebound.     Comments: No tenderness noted on palpation throughout the abdomen.  Abdomen is soft and nondistended.  Bowel sounds are normal.  Musculoskeletal:        General: Tenderness present.     Cervical back: Normal range of motion and neck supple. No rigidity.     Lumbar back: No swelling or tenderness. Positive left straight leg raise test.       Back:     Right lower leg: No edema.     Left lower leg: No edema.     Comments: Tenderness noted to the left buttock and down into the posterior left thigh.  Straight leg raise is positive.  No evidence of trauma, edema, or muscle spasm are present at this time.  Lymphadenopathy:     Cervical: No cervical adenopathy.  Skin:    General: Skin is warm and dry.     Capillary Refill: Capillary refill takes less than 2 seconds.  Neurological:     General: No focal deficit present.     Mental Status: He is alert and oriented to person, place, and time.     Cranial Nerves: No cranial nerve deficit.     Motor: No weakness.     Coordination: Coordination normal.     Gait: Gait abnormal.     Deep Tendon Reflexes: Reflexes normal.  Psychiatric:        Mood and Affect: Mood normal.        Behavior: Behavior normal.        Thought Content: Thought content normal.        Judgment: Judgment normal.     Assessment & Plan:   Problem List  Items Addressed This Visit      Cardiovascular and Mediastinum   High blood pressure    Blood pressure is elevated today at 146/87.  He has been restarted on the amlodipine as of 2 days ago.  He is in considerable amount of pain today which is likely contributing to his elevated blood pressure readings.  Discussed with his daughter to monitor his blood pressures at home and document the findings. Blood pressure goals less than 130/80 for this patient. Patient will follow-up in approximately 2 weeks with blood pressure readings we will determine at that time if changes are needed to his medication regimen. Labs have been ordered today.      Relevant Orders   CBC with Differential/Platelet (Completed)   Comprehensive metabolic panel (Completed)   Hemoglobin A1c (Completed)   Thyroid Panel With TSH   Aortic atherosclerosis (HCC)    History of aortic atherosclerosis determined on previous imaging. Will obtain labs today to evaluate lipid levels and determine if medication is needed to help control and prevent worsening of this condition. Follow-up in 2 weeks.      Relevant Orders   CBC with Differential/Platelet (Completed)   Comprehensive metabolic panel (Completed)   Lipid panel (Completed)   Hemoglobin A1c (Completed)   Thyroid Panel With TSH     Digestive   Nausea and vomiting    Chronic emesis of unknown etiology despite multiple tests and specialist. Chart reviewed for test  results and medications that have been tried.  This is most definitely a complex case.  Discussed with the patient the importance of taking the Prilosec every single day as prescribed and avoiding missed doses as this medication is needed to be taken on a regular basis to be effective. At this time patient does not appear to be losing weight but we will obtain labs today to evaluate for the possibility of malnutrition despite weight consistency.  It is unclear what could be causing his symptoms. Recommend  following up with GI if symptoms worsen or persist.      Relevant Orders   CBC with Differential/Platelet (Completed)   Comprehensive metabolic panel (Completed)   Lipid panel (Completed)   Hemoglobin A1c (Completed)     Endocrine   Hypothyroidism    Chart review reveals a history of hypothyroidism with previous use of levothyroxine. He is not on this medication at this time and is unsure of this diagnosis. We will obtain labs today to evaluate further for this. Plan to make changes to plan of care based on lab results we will follow-up in 2 weeks.      Relevant Orders   Thyroid Panel With TSH     Nervous and Auditory   Piriformis syndrome of left side    Symptoms and presentation consistent with piriformis syndrome of the left side. Steroid injection provided today along with a steroid burst of 50 mg for 5 days.  Handout provided for recommended exercises and detailed instructions provided to patient and his daughter.  Recommend alternating ice and heat for 20 minutes each at a time at least 3-4 times a day in addition to the exercises. Also recommended purchasing a TENS unit that may be helpful for pain relief. Discussed that he may continue with the gabapentin at bedtime particularly if this is helpful for him to sleep well the pain is most significant. If conservative measures at this time are not effective we will proceed with MRI.  Patient to follow-up in 2 weeks.      Relevant Medications   predniSONE (DELTASONE) 50 MG tablet     Other   Laboratory tests ordered as part of a complete physical exam (CPE)    Laboratory tests ordered today in preparation for complete physical exam to be performed in approximately 2 weeks.      Relevant Orders   CBC with Differential/Platelet (Completed)   Comprehensive metabolic panel (Completed)   Lipid panel (Completed)   Hemoglobin A1c (Completed)   Thyroid Panel With TSH   Encounter to establish care - Primary    New patient to  establish care with this practice today.  He has not received primary care in quite some time and does have some concerning symptoms presenting today. Chart review and discussion for personal and family medical history, medications, surgeries, and social determinants of health. He will need a CPE with labs in the coming weeks.  Labs will be ordered today. Will evaluate previous medical records to determine if there are any additional care gaps that need to be addressed.      Body mass index 26.0-26.9, adult    BMI 26.49 today. Patient appears to be well-nourished with no evidence of obesity. At this time no intervention is recommended for patient's current weight or BMI.         Outpatient Encounter Medications as of 07/31/2020  Medication Sig  . amLODipine (NORVASC) 5 MG tablet Take 1 tablet (5 mg total) by mouth daily. RAN  OUT 3 MONTHS AGO  . baclofen (LIORESAL) 10 MG tablet Take 1 tablet (10 mg total) by mouth at bedtime as needed for muscle spasms.  Paul Edwards gabapentin (NEURONTIN) 100 MG capsule Take 1 capsule (100 mg total) by mouth at bedtime.  Paul Edwards omeprazole (PRILOSEC) 40 MG capsule Take 1 capsule (40 mg total) by mouth daily.  . predniSONE (DELTASONE) 50 MG tablet Take 1 tablet (50 mg total) by mouth daily with breakfast. Start Saturday morning. Take with small amount of food.  . [DISCONTINUED] ADVAIR DISKUS 100-50 MCG/DOSE AEPB Inhale 1 puff into the lungs 2 (two) times daily.  . [DISCONTINUED] albuterol (PROVENTIL HFA;VENTOLIN HFA) 108 (90 BASE) MCG/ACT inhaler Inhale 2 puffs into the lungs every 4 (four) hours as needed for wheezing or shortness of breath (cough, shortness of breath or wheezing.).  . [DISCONTINUED] albuterol (PROVENTIL) (2.5 MG/3ML) 0.083% nebulizer solution USE 1 VIAL IN NEBULIZER 4 TIMES DAILY  . [DISCONTINUED] allopurinol (ZYLOPRIM) 300 MG tablet Take 300 mg by mouth daily.  . [DISCONTINUED] amoxicillin-clavulanate (AUGMENTIN) 875-125 MG tablet Take 1 tablet by mouth 2  (two) times daily.  . [DISCONTINUED] budesonide-formoterol (SYMBICORT) 160-4.5 MCG/ACT inhaler Inhale 2 puffs into the lungs 2 (two) times daily.  . [DISCONTINUED] levothyroxine (SYNTHROID) 50 MCG tablet Take 50 mcg by mouth daily.  . [DISCONTINUED] lisinopril (ZESTRIL) 20 MG tablet   . [DISCONTINUED] ondansetron (ZOFRAN-ODT) 4 MG disintegrating tablet 1 tab(s)  . [DISCONTINUED] pantoprazole (PROTONIX) 40 MG tablet Take 1 tablet (40 mg total) by mouth daily.  . [DISCONTINUED] sucralfate (CARAFATE) 1 g tablet Take 1 tablet (1 g total) by mouth 4 (four) times daily -  with meals and at bedtime.  . [DISCONTINUED] tamsulosin (FLOMAX) 0.4 MG CAPS capsule Take 1 capsule (0.4 mg total) by mouth daily.  . [DISCONTINUED] traMADol (ULTRAM) 50 MG tablet Take 1 tablet (50 mg total) by mouth every 6 (six) hours as needed for severe pain.  . [DISCONTINUED] ranitidine (ZANTAC) 300 MG tablet Take 1 tablet (300 mg total) by mouth at bedtime for 14 days. (Patient not taking: Reported on 01/27/2019)  . [EXPIRED] dexamethasone (DECADRON) injection 10 mg   . [EXPIRED] ketorolac (TORADOL) injection 60 mg    No facility-administered encounter medications on file as of 07/31/2020.    Follow-up: Return in about 4 weeks (around 08/28/2020) for piriformis f/u.   Tollie Eth, NP

## 2020-07-31 NOTE — Assessment & Plan Note (Signed)
Chronic emesis of unknown etiology despite multiple tests and specialist. Chart reviewed for test results and medications that have been tried.  This is most definitely a complex case.  Discussed with the patient the importance of taking the Prilosec every single day as prescribed and avoiding missed doses as this medication is needed to be taken on a regular basis to be effective. At this time patient does not appear to be losing weight but we will obtain labs today to evaluate for the possibility of malnutrition despite weight consistency.  It is unclear what could be causing his symptoms. Recommend following up with GI if symptoms worsen or persist.

## 2020-07-31 NOTE — Assessment & Plan Note (Signed)
New patient to establish care with this practice today.  He has not received primary care in quite some time and does have some concerning symptoms presenting today. Chart review and discussion for personal and family medical history, medications, surgeries, and social determinants of health. He will need a CPE with labs in the coming weeks.  Labs will be ordered today. Will evaluate previous medical records to determine if there are any additional care gaps that need to be addressed.

## 2020-07-31 NOTE — Patient Instructions (Addendum)
Recommendations from today's visit:  It was a pleasure to meet you today.  We will work to get records from any previous providers (if any) to be sure that you are up to date on your health maintenance and testing. Please let us know of any previous health care providers you have seen in the past 5 years with name of office and name of provider.   I have ordered labs- you will schedule these at check out and can have them done today if this is convenient.  Labs are drawn on the Ground floor of this building- if you take the main elevators to the ground floor you will see the glass doors to the lab immediately to your left off of the elevator.   I would like you to do the exercises given to you today and use a heating pad on your bottom 3 times a day for 20 minutes at a time for the next 5 days.   We will follow-up in 3-4 weeks to check to see if this has improved- if not then we can proceed with further testing.   I would like you to check your blood pressure at home while resting a few times a week for the next 3-4 weeks and write this down and bring it with you at your next appointment so we can review this.   Piriformis Syndrome  Piriformis syndrome is a condition that can cause pain and numbness in your buttocks and down the back of your leg. Piriformis syndrome happens when the small muscle that connects the base of your spine to your hip (piriformis muscle) presses on the nerve that runs down the back of your leg (sciatic nerve). The piriformis muscle helps your hip rotate and helps to bring your leg back and out. It also helps shift your weight to keep you stable while you are walking. The sciatic nerve runs under or through the piriformis muscle. Damage to the piriformis muscle can cause spasms that put pressure on the nerve below. This causes pain and discomfort while sitting and moving. The pain may feel as if it begins in the buttock and spreads (radiates) down your hip and thigh. What  are the causes? This condition is caused by pressure on the sciatic nerve from the piriformis muscle. The piriformis muscle can get irritated with overuse, especially if other hip muscles are weak and the piriformis muscle has to do extra work. Piriformis syndrome can also occur after an injury, like a fall onto your buttocks. What increases the risk? You are more likely to develop this condition if you:  Are a woman.  Sit for long periods of time.  Are a cyclist.  Have weak buttocks muscles (gluteal muscles). What are the signs or symptoms? Symptoms of this condition include:  Pain, tingling, or numbness that starts in the buttock and runs down the back of your leg (sciatica).  Pain in the groin or thigh area. Your symptoms may get worse:  The longer you sit.  When you walk, run, or climb stairs.  When straining to have a bowel movement. How is this diagnosed? This condition is diagnosed based on your symptoms, medical history, and physical exam.  During the exam, your health care provider may: ? Move your leg into different positions to check for pain. ? Press on the muscles of your hip and buttock to see if that increases your symptoms.  You may also have tests, including: ? Imaging tests such as X-rays, MRI,  or ultrasound. ? Electromyogram (EMG). This test measures electrical signals sent by your nerves into the muscles. ? Nerve conduction study. This test measures how well electrical signals pass through your nerves. How is this treated? This condition may be treated by:  Stopping all activities that cause pain or make your condition worse.  Applying ice or using heat therapy.  Taking medicines to reduce pain and swelling.  Taking a muscle relaxer (muscle relaxant) to stop muscle spasms.  Doing range-of-motion and strengthening exercises (physical therapy) as told by your health care provider.  Massaging the area.  Having acupuncture.  Getting an injection  of medicine in the piriformis muscle. Your health care provider will choose the medicine based on your condition. He or she may inject: ? An anti-inflammatory medicine (steroid) to reduce swelling. ? A numbing medicine (local anesthetic) to block the pain. ? Botulinum toxin. The toxin blocks nerve impulses to specific muscles to reduce muscle tension. In rare cases, you may need surgery to cut the muscle and release pressure on the nerve if other treatments do not work. Follow these instructions at home: Activity  Do not sit for long periods. Get up and walk around every 20 minutes or as often as told by your health care provider. ? When driving long distances, make sure to take frequent stops to get up and stretch.  Use a cushion when you sit on hard surfaces.  Do exercises as told by your health care provider.  Return to your normal activities as told by your health care provider. Ask your health care provider what activities are safe for you. Managing pain, stiffness, and swelling  If directed, apply heat to the affected area as often as told by your health care provider. Use the heat source that your health care provider recommends, such as a moist heat pack or a heating pad. ? Place a towel between your skin and the heat source. ? Leave the heat on for 20-30 minutes. ? Remove the heat if your skin turns bright red. This is especially important if you are unable to feel pain, heat, or cold. You may have a greater risk of getting burned.  If directed, put ice on the injured area. ? Put ice in a plastic bag. ? Place a towel between your skin and the bag. ? Leave the ice on for 20 minutes, 2-3 times a day.      General instructions  Take over-the-counter and prescription medicines only as told by your health care provider.  Ask your health care provider if the medicine prescribed to you requires you to avoid driving or using heavy machinery.  You may need to take actions to  prevent or treat constipation, such as: ? Drink enough fluid to keep your urine pale yellow. ? Take over-the-counter or prescription medicines. ? Eat foods that are high in fiber, such as beans, whole grains, and fresh fruits and vegetables. ? Limit foods that are high in fat and processed sugars, such as fried or sweet foods.  Keep all follow-up visits as told by your health care provider. This is important. How is this prevented?  Do not sit for longer than 20 minutes at a time. When you sit, choose padded surfaces.  Warm up and stretch before being active.  Cool down and stretch after being active.  Give your body time to rest between periods of activity.  Make sure to use equipment that fits you.  Maintain physical fitness, including: ? Strength. ? Flexibility.  Contact a health care provider if:  Your pain and stiffness continue or get worse.  Your leg or hip becomes weak.  You have changes in your bowel function or bladder function. Summary  Piriformis syndrome is a condition that can cause pain, tingling, and numbness in your buttocks and down the back of your leg.  You may try applying heat or ice to relieve the pain.  Do not sit for long periods. Get up and walk around every 20 minutes or as often as told by your health care provider. This information is not intended to replace advice given to you by your health care provider. Make sure you discuss any questions you have with your health care provider. Document Revised: 07/19/2018 Document Reviewed: 11/22/2017 Elsevier Patient Education  2021 [employer].

## 2020-07-31 NOTE — Assessment & Plan Note (Signed)
Laboratory tests ordered today in preparation for complete physical exam to be performed in approximately 2 weeks.

## 2020-08-01 LAB — THYROID PANEL WITH TSH
Free Thyroxine Index: 2.2 (ref 1.2–4.9)
T3 Uptake Ratio: 28 % (ref 24–39)
T4, Total: 7.8 ug/dL (ref 4.5–12.0)
TSH: 4.97 u[IU]/mL — ABNORMAL HIGH (ref 0.450–4.500)

## 2020-08-05 NOTE — Progress Notes (Signed)
Please call patient and let them know that his labs show a few things that I would like for him to come in to discuss. Will need a 40 minute appointment. New diabetes, new hypothyroidism, elevated triglycerides.   His labs show that he has several things we need to work on. He does have new diabetes and we need to address this with medication. This could be the cause of some of his stomach problems. He also has elevated thyroid levels, which indicate hypothyroidism which can cause many complications. His triglycerides are also very elevated, which can cause problems with his stomach and we need address these as well.

## 2020-08-10 ENCOUNTER — Telehealth (HOSPITAL_BASED_OUTPATIENT_CLINIC_OR_DEPARTMENT_OTHER): Payer: Self-pay

## 2020-08-10 NOTE — Telephone Encounter (Signed)
Called patient's daughter Raijon Lindfors to go over lab results and schedule follow-up appointment. She did not answer and has a mailbox that is full and cannot accept any messages. Will attempt to call her later.

## 2020-08-10 NOTE — Telephone Encounter (Signed)
-----   Message from Sara E Early, NP sent at 08/05/2020  6:55 PM EDT ----- Please call patient and let them know that his labs show a few things that I would like for him to come in to discuss. Will need a 40 minute appointment. New diabetes, new hypothyroidism, elevated triglycerides.   His labs show that he has several things we need to work on. He does have new diabetes and we need to address this with medication. This could be the cause of some of his stomach problems. He also has elevated thyroid levels, which indicate hypothyroidism which can cause many complications. His triglycerides are also very elevated, which can cause problems with his stomach and we need address these as well.  

## 2020-08-10 NOTE — Telephone Encounter (Signed)
2nd attempt to contact patient's daughter Paul Edwards to go over lab results and schedule follow-up appointment. She did not answer and has a mailbox that is full and cannot accept any messages. Will attempt to call her later.

## 2020-08-12 ENCOUNTER — Encounter (HOSPITAL_BASED_OUTPATIENT_CLINIC_OR_DEPARTMENT_OTHER): Payer: Self-pay

## 2020-08-12 ENCOUNTER — Telehealth (HOSPITAL_BASED_OUTPATIENT_CLINIC_OR_DEPARTMENT_OTHER): Payer: Self-pay

## 2020-08-12 NOTE — Telephone Encounter (Signed)
3rd attempt to contact patient's daughter Kurtiss Wence go over labresults and schedule follow-up appointment.Shedid not answer and has a mailbox that is full and cannot accept any messages. Will send letter.

## 2020-08-12 NOTE — Telephone Encounter (Signed)
-----   Message from Tollie Eth, NP sent at 08/05/2020  6:55 PM EDT ----- Please call patient and let them know that his labs show a few things that I would like for him to come in to discuss. Will need a 40 minute appointment. New diabetes, new hypothyroidism, elevated triglycerides.   His labs show that he has several things we need to work on. He does have new diabetes and we need to address this with medication. This could be the cause of some of his stomach problems. He also has elevated thyroid levels, which indicate hypothyroidism which can cause many complications. His triglycerides are also very elevated, which can cause problems with his stomach and we need address these as well.

## 2020-09-03 ENCOUNTER — Telehealth (HOSPITAL_BASED_OUTPATIENT_CLINIC_OR_DEPARTMENT_OTHER): Payer: Self-pay | Admitting: Nurse Practitioner

## 2020-09-03 NOTE — Telephone Encounter (Signed)
Pt relative called on Pt behalf - returning calls by CMA - Please call back @ [phone]

## 2020-09-04 ENCOUNTER — Other Ambulatory Visit: Payer: Self-pay

## 2020-09-04 ENCOUNTER — Ambulatory Visit (INDEPENDENT_AMBULATORY_CARE_PROVIDER_SITE_OTHER): Payer: Medicare Other | Admitting: Nurse Practitioner

## 2020-09-04 ENCOUNTER — Encounter (HOSPITAL_BASED_OUTPATIENT_CLINIC_OR_DEPARTMENT_OTHER): Payer: Self-pay | Admitting: Nurse Practitioner

## 2020-09-04 VITALS — BP 133/88 | HR 71 | Wt 177.6 lb

## 2020-09-04 DIAGNOSIS — E782 Mixed hyperlipidemia: Secondary | ICD-10-CM

## 2020-09-04 DIAGNOSIS — Z794 Long term (current) use of insulin: Secondary | ICD-10-CM

## 2020-09-04 DIAGNOSIS — E1165 Type 2 diabetes mellitus with hyperglycemia: Secondary | ICD-10-CM | POA: Insufficient documentation

## 2020-09-04 DIAGNOSIS — R109 Unspecified abdominal pain: Secondary | ICD-10-CM

## 2020-09-04 DIAGNOSIS — E039 Hypothyroidism, unspecified: Secondary | ICD-10-CM | POA: Diagnosis not present

## 2020-09-04 DIAGNOSIS — J4540 Moderate persistent asthma, uncomplicated: Secondary | ICD-10-CM

## 2020-09-04 DIAGNOSIS — I10 Essential (primary) hypertension: Secondary | ICD-10-CM | POA: Diagnosis not present

## 2020-09-04 HISTORY — DX: Mixed hyperlipidemia: E78.2

## 2020-09-04 MED ORDER — BREO ELLIPTA 100-25 MCG/INH IN AEPB: 1.0000 | INHALATION_SPRAY | Freq: Every day | RESPIRATORY_TRACT | 0 refills | Status: DC

## 2020-09-04 MED ORDER — AMLODIPINE BESYLATE 5 MG PO TABS: 5.0000 mg | ORAL_TABLET | Freq: Every day | ORAL | 3 refills | Status: DC

## 2020-09-04 MED ORDER — LEVOTHYROXINE SODIUM 25 MCG PO TABS: 25.0000 ug | ORAL_TABLET | Freq: Every day | ORAL | 3 refills | Status: DC

## 2020-09-04 MED ORDER — ALBUTEROL SULFATE (2.5 MG/3ML) 0.083% IN NEBU: 2.5000 mg | INHALATION_SOLUTION | RESPIRATORY_TRACT | 5 refills | Status: DC | PRN

## 2020-09-04 MED ORDER — OMEPRAZOLE 40 MG PO CPDR: 40.0000 mg | DELAYED_RELEASE_CAPSULE | Freq: Every day | ORAL | 3 refills | Status: DC

## 2020-09-04 NOTE — Patient Instructions (Signed)
Breo Inhaler use one time every day. If this work, let me know and I can send in a prescription or get you another sample to use.   I have sent in synthroid to the pharmacy for your thyroid.  You take this medicine every morning 30 minutes before you eat anything.  This medicine is taken every day.  I have sent in refills of your albuterol nebulizer and in refills of your blood pressure medication.   I would like to see you back in 8 weeks so we can recheck your thyroid and we can talk more about your blood sugar at that time. We may need to start medication for that.   Watch your diet and try to limit your saturated fats and carbohydrates (breads, pasta, rice, sugars).  If you need me before the next appointment ,please let me know.    Hypothyroidism  Hypothyroidism is when the thyroid gland does not make enough of certain hormones (it is underactive). The thyroid gland is a small gland located in the lower front part of the neck, just in front of the windpipe (trachea). This gland makes hormones that help control how the body uses food for energy (metabolism) as well as how the heart and brain function. These hormones also play a role in keeping your bones strong. When the thyroid is underactive, it produces too little of the hormones thyroxine (T4) and triiodothyronine (T3). What are the causes? This condition may be caused by:  Hashimoto's disease. This is a disease in which the body's disease-fighting system (immune system) attacks the thyroid gland. This is the most common cause.  Viral infections.  Pregnancy.  Certain medicines.  Birth defects.  Past radiation treatments to the head or neck for cancer.  Past treatment with radioactive iodine.  Past exposure to radiation in the environment.  Past surgical removal of part or all of the thyroid.  Problems with a gland in the center of the brain (pituitary gland).  Lack of enough iodine in the diet. What increases the  risk? You are more likely to develop this condition if:  You are male.  You have a family history of thyroid conditions.  You use a medicine called lithium.  You take medicines that affect the immune system (immunosuppressants). What are the signs or symptoms? Symptoms of this condition include:  Feeling as though you have no energy (lethargy).  Not being able to tolerate cold.  Weight gain that is not explained by a change in diet or exercise habits.  Lack of appetite.  Dry skin.  Coarse hair.  Menstrual irregularity.  Slowing of thought processes.  Constipation.  Sadness or depression. How is this diagnosed? This condition may be diagnosed based on:  Your symptoms, your medical history, and a physical exam.  Blood tests. You may also have imaging tests, such as an ultrasound or MRI. How is this treated? This condition is treated with medicine that replaces the thyroid hormones that your body does not make. After you begin treatment, it may take several weeks for symptoms to go away. Follow these instructions at home:  Take over-the-counter and prescription medicines only as told by your health care provider.  If you start taking any new medicines, tell your health care provider.  Keep all follow-up visits as told by your health care provider. This is important. ? As your condition improves, your dosage of thyroid hormone medicine may change. ? You will need to have blood tests regularly so that your health  care provider can monitor your condition. Contact a health care provider if:  Your symptoms do not get better with treatment.  You are taking thyroid hormone replacement medicine and you: ? Sweat a lot. ? Have tremors. ? Feel anxious. ? Lose weight rapidly. ? Cannot tolerate heat. ? Have emotional swings. ? Have diarrhea. ? Feel weak. Get help right away if you have:  Chest pain.  An irregular heartbeat.  A rapid heartbeat.  Difficulty  breathing. Summary  Hypothyroidism is when the thyroid gland does not make enough of certain hormones (it is underactive).  When the thyroid is underactive, it produces too little of the hormones thyroxine (T4) and triiodothyronine (T3).  The most common cause is Hashimoto's disease, a disease in which the body's disease-fighting system (immune system) attacks the thyroid gland. The condition can also be caused by viral infections, medicine, pregnancy, or past radiation treatment to the head or neck.  Symptoms may include weight gain, dry skin, constipation, feeling as though you do not have energy, and not being able to tolerate cold.  This condition is treated with medicine to replace the thyroid hormones that your body does not make. This information is not intended to replace advice given to you by your health care provider. Make sure you discuss any questions you have with your health care provider. Document Revised: 12/27/2019 Document Reviewed: 12/12/2019 Elsevier Patient Education  2021 [employer].  Levothyroxine tablets What is this medicine? LEVOTHYROXINE (lee voe thye ROX een) is a thyroid hormone. This medicine can improve symptoms of thyroid deficiency such as slow speech, lack of energy, weight gain, hair loss, dry skin, and feeling cold. It also helps to treat goiter (an enlarged thyroid gland). It is also used to treat some kinds of thyroid cancer along with surgery and other medicines. This medicine may be used for other purposes; ask your health care provider or pharmacist if you have questions. COMMON BRAND NAME(S): Estre, Euthyrox, Levo-T, Levothroid, Levoxyl, Synthroid, Thyro-Tabs, Unithroid What should I tell my health care provider before I take this medicine? They need to know if you have any of these conditions:  Addison's disease or other adrenal gland problem  angina  bone problems  diabetes  dieting or on a weight loss program  fertility  problems  heart disease  pituitary gland problem  take medicines that treat or prevent blood clots  an unusual or allergic reaction to levothyroxine, thyroid hormones, other medicines, foods, dyes, or preservatives  pregnant or trying to get pregnant  breast-feeding How should I use this medicine? Take this medicine by mouth with plenty of water. It is best to take on an empty stomach, at least 30 minutes to one hour before breakfast. Avoid taking antacids containing aluminum or magnesium, simethicone, bile acid sequestrants, calcium carbonate, sodium polystyrene sulfonate, ferrous sulfate, sevelamer, lanthanum, or sucralfate within 4 hours of taking this medicine. Follow the directions on the prescription label. Take at the same time each day. Do not take your medicine more often than directed. Contact your pediatrician regarding the use of this medicine in children. While this drug may be prescribed for children and infants as young as a few days of age for selected conditions, precautions do apply. For infants, you may crush the tablet and place in a small amount of (5 to 10 mL or 1 to 2 teaspoonfuls) of water, breast milk, or non-soy based infant formula. Do not mix with soy-based infant formula. Give as directed. Overdosage: If you think you  have taken too much of this medicine contact a poison control center or emergency room at once. NOTE: This medicine is only for you. Do not share this medicine with others. What if I miss a dose? If you miss a dose, take it as soon as you can. If it is almost time for your next dose, take only that dose. Do not take double or extra doses. What may interact with this medicine?  amiodarone  antacids  anti-thyroid medicines  calcium supplements  carbamazepine  certain medicines for depression  certain medicines to treat cancer  cholestyramine  clofibrate  colesevelam  colestipol  digoxin  male hormones, like estrogens and birth  control pills, patches, rings, or injections  iron supplements  ketamine  lanthanum  liquid nutrition products like Ensure  lithium  medicines for colds and breathing difficulties  medicines for diabetes  medicines or dietary supplements for weight loss  methadone  niacin  orlistat  oxandrolone  phenobarbital or other barbiturates  phenytoin  rifampin  sevelamer  simethicone  sodium polystyrene sulfonate  soy isoflavones  steroid medicines like prednisone or cortisone  sucralfate  testosterone  theophylline  warfarin This list may not describe all possible interactions. Give your health care provider a list of all the medicines, herbs, non-prescription drugs, or dietary supplements you use. Also tell them if you smoke, drink alcohol, or use illegal drugs. Some items may interact with your medicine. What should I watch for while using this medicine? Be sure to take this medicine with plenty of fluids. Some tablets may cause choking, gagging, or difficulty swallowing from the tablet getting stuck in your throat. Most of these problems disappear if the medicine is taken with the right amount of water or other fluids. Do not switch brands of this medicine unless your health care professional agrees with the change. Ask questions if you are uncertain. You will need regular exams and occasional blood tests to check the response to treatment. If you are receiving this medicine for an underactive thyroid, it may be several weeks before you notice an improvement. Check with your doctor or health care professional if your symptoms do not improve. It may be necessary for you to take this medicine for the rest of your life. Do not stop using this medicine unless your doctor or health care professional advises you to. This medicine can affect blood sugar levels. If you have diabetes, check your blood sugar as directed. You may lose some of your hair when you first start  treatment. With time, this usually corrects itself. If you are going to have surgery, tell your doctor or health care professional that you are taking this medicine. What side effects may I notice from receiving this medicine? Side effects that you should report to your doctor or health care professional as soon as possible:  allergic reactions like skin rash, itching or hives, swelling of the face, lips, or tongue  anxious  breathing problems  changes in menstrual periods  chest pain  diarrhea  excessive sweating or intolerance to heat  fast or irregular heartbeat  leg cramps  nervousness  swelling of ankles, feet, or legs  tremors  trouble sleeping  vomiting Side effects that usually do not require medical attention (report to your doctor or health care professional if they continue or are bothersome):  changes in appetite  headache  irritable  nausea  weight loss This list may not describe all possible side effects. Call your doctor for medical advice about  side effects. You may report side effects to FDA at 1-800-FDA-1088. Where should I keep my medicine? Keep out of the reach of children. Store at room temperature between 15 and 30 degrees C (59 and 86 degrees F). Protect from light and moisture. Keep container tightly closed. Throw away any unused medicine after the expiration date. NOTE: This sheet is a summary. It may not cover all possible information. If you have questions about this medicine, talk to your doctor, pharmacist, or health care provider.  2021 Elsevier/Gold Standard (2019-03-21 13:39:26)  Fluticasone; Vilanterol inhalation powder What is this medicine? FLUTICASONE; VILANTEROL (floo TIK a sone; vye LAN ter ol) inhalation is a combination of two medicines that decrease inflammation and help to open up the airways of your lungs. It is for chronic obstructive pulmonary disease (COPD), including chronic bronchitis or emphysema. It is also used  for asthma in adults to help control symptoms. Do NOT use for an acute asthma attack or COPD attack. This medicine may be used for other purposes; ask your health care provider or pharmacist if you have questions. COMMON BRAND NAME(S): BREO ELLIPTA What should I tell my health care provider before I take this medicine? They need to know if you have any of these conditions:  bone problems  diabetes  eye disease, vision problems  immune system problems  heart disease or irregular heartbeat  high blood pressure  infection  pheochromocytoma  seizures  thyroid disease  an unusual or allergic reaction to fluticasone, vilanterol, milk proteins, corticosteroids, other medicines, foods, dyes, or preservatives  pregnant or trying to get pregnant  breast-feeding How should I use this medicine? This medicine is inhaled through the mouth. It is used once per day. Follow the directions on the prescription label. Do not use a spacer device with this inhaler. Take your medicine at regular intervals. Do not take your medicine more often than directed. Do not stop taking except on your doctor's advice. Make sure that you are using your inhaler correctly. Ask you doctor or health care provider if you have any questions. A special MedGuide will be given to you by the pharmacist with each prescription and refill. Be sure to read this information carefully each time. Talk to your pediatrician regarding the use of this medicine in children. Special care may be needed. This medicine is not approved for use in children under 65 years of age. Overdosage: If you think you have taken too much of this medicine contact a poison control center or emergency room at once. NOTE: This medicine is only for you. Do not share this medicine with others. What if I miss a dose? If you miss a dose, use it as soon as you can. If it is almost time for your next dose, use only that dose and continue with your regular  schedule. Do not use double or extra doses. What may interact with this medicine? Do not take this medicine with any of the following medications:  cisapride  dofetilide  dronedarone  MAOIs like Carbex, Eldepryl, Marplan, Nardil, and Parnate  pimozide  thioridazine  ziprasidone This medicine may also interact with the following medications:  antiviral medicines for HIV or AIDS  beta-blockers like metoprolol and propranolol  certain medicines for depression, anxiety, or psychotic disturbances  certain medicines for fungal infections like ketoconazole, itraconazole, posaconazole, voriconazole  conivaptan  diuretics  medicines for colds  nefazodone  other medicines for breathing problems  other medicines that prolong the QT interval (cause an abnormal  heart rhythm) This list may not describe all possible interactions. Give your health care provider a list of all the medicines, herbs, non-prescription drugs, or dietary supplements you use. Also tell them if you smoke, drink alcohol, or use illegal drugs. Some items may interact with your medicine. What should I watch for while using this medicine? Visit your doctor or health care professional for regular checkups. Tell your doctor or health care professional if your symptoms do not get better. Do not use this medicine more than once every 24 hours. NEVER use this medicine for an acute asthma or COPD attack. You should use your short-acting rescue inhalers for this purpose. If your symptoms get worse or if you need your short-acting inhalers more often, call your doctor right away. If you are going to have surgery tell your doctor or health care professional that you are using this medicine. Try not to come in contact with people with the chicken pox or measles. If you do, call your doctor. This medicine may increase blood sugar. Ask your healthcare provider if changes in diet or medicines are needed if you have diabetes. What  side effects may I notice from receiving this medicine? Side effects that you should report to your doctor or health care professional as soon as possible:  allergic reactions like skin rash or hives, swelling of the face, lips, or tongue  breathing problems right after inhaling your medicine  chest pain  fast, irregular heartbeat  feeling faint or lightheaded, falls  fever or chills  nausea, vomiting  signs and symptoms of high blood sugar such as being more thirsty or hungry or having to urinate more than normal. You may also feel very tired or have blurry vision. Side effects that usually do not require medical attention (report to your doctor or health care professional if they continue or are bothersome):  cough  headache  nervousness  sore throat  tremor This list may not describe all possible side effects. Call your doctor for medical advice about side effects. You may report side effects to FDA at 1-800-FDA-1088. Where should I keep my medicine? Keep out of the reach of children. Store at room temperature between 15 and 30 degrees C (59 and 86 degrees F). Store in a dry place away from direct heat or sunlight. Throw away 6 weeks after you remove the inhaler from the foil tray, or after the dose indicator reads 0, whichever comes first. Throw away any unopened packages after the expiration date. NOTE: This sheet is a summary. It may not cover all possible information. If you have questions about this medicine, talk to your doctor, pharmacist, or health care provider.  2021 Elsevier/Gold Standard (2017-12-27 11:19:39)

## 2020-09-04 NOTE — Assessment & Plan Note (Signed)
Elevated lipids and triglycerides on recent labs Patient is starting on 2 new medications today, therefore will wait to begin treatment until next visit Educated on diet recommendations Educated on effect that elevated triglycerides have on the pancreas and r/o pancreatitis associated Consider this a cause of the intermittent abdominal pain- but less likely than GERD/PUD Will plan for statin start at f/u in 8 weeks.

## 2020-09-04 NOTE — Assessment & Plan Note (Signed)
Asthma originally dx and tx at work Worsening sx with increased allergens in the air No signs of distress or infection present today Refills on albuterol nebulizer today Sample of Breo 100mg  provided for patient to try- will let me know if this is working for him Review of medication and how to use inhaler today Notify if sx worsen or fail to improve

## 2020-09-04 NOTE — Assessment & Plan Note (Signed)
Restart levothyroxine at today Close monitoring for suspected subclinical hypothyroid Next last will obtain TPO AB to r/o autoimmune cause Treatment d/t sx F/u in 8 weeks for repeat testing

## 2020-09-04 NOTE — Assessment & Plan Note (Signed)
Restarting levothyroxine today- will plan to start new medications at F/U visit for DM Avoid excessive new medication start at the same time due to time for education and discussion of condition and treatments Recommend diet low in carbs and saturated fats at this time Will recheck labs at F/U in 8 weeks  Consider alternative to metformin given current GI concerns

## 2020-09-04 NOTE — Assessment & Plan Note (Signed)
>>  ASSESSMENT AND PLAN FOR ASTHMA WRITTEN ON 09/04/2020  8:42 PM BY Arsen Mangione E, NP  Asthma originally dx and tx at work Worsening sx with increased allergens in the air No signs of distress or infection present today Refills on albuterol nebulizer today Sample of Breo 100mg  provided for patient to try- will let me know if this is working for him Review of medication and how to use inhaler today Notify if sx worsen or fail to improve

## 2020-09-04 NOTE — Assessment & Plan Note (Signed)
No alarm sx present- benign exam Suspect pain and nausea due to possible GERD exacerbation- it is unclear if medications are taken regularly. Consider DM contributing factor Continue prilosec daily

## 2020-09-04 NOTE — Progress Notes (Signed)
Established Patient Office Visit  Subjective:  Patient ID: Paul Edwards, male    DOB: 1956/01/24  Age: 65 y.o. MRN: [id number]  CC:  Chief Complaint  Patient presents with  . Follow-up    HPI Paul Edwards presents for f/u for piriformis pain. He is accompanied by his daughter who helps translate per his request.  He was initially seen approximately 4 weeks ago with presentation of left sided hip, leg, and low back pain which was thought to be due to piriformis syndrome.  IM steroid injection with an additional 5 day steroid burst, provided Home exercises recommended  Gabapentin for pain relief Reports his pain is much improved  No longer taking the gabapentin.  He has no further concerns today.   Reports exacerbation of asthma symptoms with 2-3 time per day use of albuterol inhaler/nebulizer Was given this by nurse at work last year Denies chest pain, nausea, sweating, weakness, shoulder/jaw/arm pain. Albuterol helpful for symptoms Allergens in the air contribute to symptoms  Of note-  At last visit labs were drawn which showed new DM, hypothyroidism, and mixed hyperlipidemia  Attempts to reach the patient were unsuccessful by phone  Letter sent with recommendations for follow-up to discuss these new conditions. He reports he did receive the letter of recommendations. He is agreeable to start treatments  Outpatient Medications Prior to Visit  Medication Sig Dispense Refill  . amLODipine (NORVASC) 5 MG tablet Take 1 tablet (5 mg total) by mouth daily. RAN OUT 3 MONTHS AGO 30 tablet 0  . baclofen (LIORESAL) 10 MG tablet Take 1 tablet (10 mg total) by mouth at bedtime as needed for muscle spasms. 15 each 0  . gabapentin (NEURONTIN) 100 MG capsule Take 1 capsule (100 mg total) by mouth at bedtime. 15 capsule 0  . omeprazole (PRILOSEC) 40 MG capsule Take 1 capsule (40 mg total) by mouth daily. 30 capsule 3  . predniSONE (DELTASONE) 50 MG tablet Take 1 tablet (50 mg total) by mouth  daily with breakfast. Start Saturday morning. Take with small amount of food. 5 tablet 0   No facility-administered medications prior to visit.    No Known Allergies  ROS Review of Systems All review of systems negative except what is listed in the HPI    Objective:    Physical Exam Vitals and nursing note reviewed.  Constitutional:      General: He is not in acute distress.    Appearance: Normal appearance.  HENT:     Head: Normocephalic and atraumatic.  Eyes:     Extraocular Movements: Extraocular movements intact.     Conjunctiva/sclera: Conjunctivae normal.     Pupils: Pupils are equal, round, and reactive to light.  Neck:     Vascular: No carotid bruit.  Cardiovascular:     Rate and Rhythm: Normal rate and regular rhythm.     Pulses: Normal pulses.     Heart sounds: Normal heart sounds.  Pulmonary:     Effort: Pulmonary effort is normal.     Breath sounds: Wheezing present.  Abdominal:     General: Abdomen is flat. Bowel sounds are normal. There is no distension.     Palpations: Abdomen is soft.     Tenderness: There is no abdominal tenderness. There is no right CVA tenderness, left CVA tenderness or guarding.  Musculoskeletal:        General: Normal range of motion.     Cervical back: Normal range of motion.     Right lower leg:  No edema.     Left lower leg: No edema.  Skin:    General: Skin is warm and dry.     Capillary Refill: Capillary refill takes less than 2 seconds.  Neurological:     General: No focal deficit present.     Mental Status: He is alert and oriented to person, place, and time.  Psychiatric:        Mood and Affect: Mood normal.        Behavior: Behavior normal.        Thought Content: Thought content normal.        Judgment: Judgment normal.     BP 133/88   Pulse 71   Wt 177 lb 9.6 oz (80.6 kg)   SpO2 96%   BMI 27.00 kg/m  Wt Readings from Last 3 Encounters:  09/04/20 177 lb 9.6 oz (80.6 kg)  07/31/20 174 lb 3.2 oz (79 kg)   02/22/18 173 lb 6.4 oz (78.7 kg)     Health Maintenance Due  Topic Date Due  . COVID-19 Vaccine (1) Never done  . COLONOSCOPY (Pts 45-59yrs Insurance coverage will need to be confirmed)  Never done  . Zoster Vaccines- Shingrix (1 of 2) Never done  . PNA vac Low Risk Adult (1 of 2 - PCV13) Never done    There are no preventive care reminders to display for this patient.  Lab Results  Component Value Date   TSH 4.970 (H) 07/31/2020   Lab Results  Component Value Date   WBC 8.5 07/31/2020   HGB 14.8 07/31/2020   HCT 46.7 07/31/2020   MCV 81.8 07/31/2020   PLT 193 07/31/2020   Lab Results  Component Value Date   NA 138 07/31/2020   K 4.0 07/31/2020   CO2 30 07/31/2020   GLUCOSE 84 07/31/2020   BUN 21 07/31/2020   CREATININE 0.89 07/31/2020   BILITOT 0.5 07/31/2020   ALKPHOS 80 07/31/2020   AST 57 (H) 07/31/2020   ALT 88 (H) 07/31/2020   PROT 7.5 07/31/2020   ALBUMIN 4.4 07/31/2020   CALCIUM 9.3 07/31/2020   ANIONGAP 8 07/31/2020   Lab Results  Component Value Date   CHOL 195 07/31/2020   Lab Results  Component Value Date   HDL 40 (L) 07/31/2020   Lab Results  Component Value Date   LDLCALC 78 07/31/2020   Lab Results  Component Value Date   TRIG 387 (H) 07/31/2020   Lab Results  Component Value Date   CHOLHDL 4.9 07/31/2020   Lab Results  Component Value Date   HGBA1C 6.6 (H) 07/31/2020      Assessment & Plan:   Problem List Items Addressed This Visit    High blood pressure - Primary   Relevant Medications   amLODipine (NORVASC) 5 MG tablet   Hypothyroidism   Relevant Medications   levothyroxine (SYNTHROID) 25 MCG tablet   Asthma   Relevant Medications   albuterol (PROVENTIL) (2.5 MG/3ML) 0.083% nebulizer solution   fluticasone furoate-vilanterol (BREO ELLIPTA) 100-25 MCG/INH AEPB   Abdominal pain   Relevant Medications   omeprazole (PRILOSEC) 40 MG capsule      Meds ordered this encounter  Medications  . amLODipine (NORVASC) 5  MG tablet    Sig: Take 1 tablet (5 mg total) by mouth daily.    Dispense:  90 tablet    Refill:  3  . omeprazole (PRILOSEC) 40 MG capsule    Sig: Take 1 capsule (40 mg total) by mouth daily.  Dispense:  90 capsule    Refill:  3  . levothyroxine (SYNTHROID) 25 MCG tablet    Sig: Take 1 tablet (25 mcg total) by mouth daily before breakfast.    Dispense:  30 tablet    Refill:  3  . albuterol (PROVENTIL) (2.5 MG/3ML) 0.083% nebulizer solution    Sig: Take 3 mLs (2.5 mg total) by nebulization every 4 (four) hours as needed for up to 60 doses for wheezing or shortness of breath.    Dispense:  180 mL    Refill:  5  . fluticasone furoate-vilanterol (BREO ELLIPTA) 100-25 MCG/INH AEPB    Sig: Inhale 1 puff into the lungs daily.    Dispense:  28 each    Refill:  0    Follow-up: Return in about 2 months (around 11/04/2020) for Thyroid F/U, Discuss DM.    Tollie Eth, NP

## 2020-09-04 NOTE — Assessment & Plan Note (Signed)
BP improved at 133/88 today No alarm signs present Tolerating medication well No vision changes Refills provided Labs in 5 months F/U sooner if needed

## 2020-10-29 ENCOUNTER — Encounter (HOSPITAL_BASED_OUTPATIENT_CLINIC_OR_DEPARTMENT_OTHER): Payer: Self-pay | Admitting: Nurse Practitioner

## 2020-10-29 ENCOUNTER — Ambulatory Visit (HOSPITAL_BASED_OUTPATIENT_CLINIC_OR_DEPARTMENT_OTHER): Payer: Medicare Other | Admitting: Nurse Practitioner

## 2020-10-29 NOTE — Progress Notes (Signed)
No show

## 2020-10-29 NOTE — Patient Instructions (Signed)
Recommendations from today:  Having diabetes makes you at risk for many complications, including heart attack and stroke.  To help reduce these risks, I would like to start you on three medications today to help with your diabetes.   New Medicines: 1- Metformin 500mg  twice a day (with breakfast and with dinner)  This medicine will help your body use the sugar in your blood  The biggest side effect is loose stools. If this is not tolerable, let me know 2- Atrovastatin 20mg  at bedtime  This medicine helps to lower your cholesterol 3- Aspirin 81mg  once a day This medicine helps to prevent blood clots that may occur due to diabetes, high blood pressure, high cholesterol.  Take this medication with food every day.   We will let you know what your thyroid levels look like and if we need to change your thyroid medicine by calling you.  Continue to take: Levothyroxine per day  This medicine is for your thyroid and should be taken every morning at least 30 minutes before eating to work properly.     Please monitor your diet and exercise every day.  I recommend walking 20 minutes every day to help control your blood sugar, high blood pressure, and cholesterol.  I recommend avoiding fried and fatty foods. Eat lean meats and vegetables. Avoid foods high in carbohydrates and sugars (rice, bread, pasta, potatoes, sweets, sugary drinks, fruit juice).  You should eat 1200-1500 calories per day with no more than 200 grams of carbohydrates. Read packaging labels to see amount of carbohydrates in food and monitor serving sizes.

## 2020-11-02 ENCOUNTER — Encounter (HOSPITAL_BASED_OUTPATIENT_CLINIC_OR_DEPARTMENT_OTHER): Payer: Self-pay | Admitting: Nurse Practitioner

## 2020-11-12 ENCOUNTER — Telehealth (HOSPITAL_BASED_OUTPATIENT_CLINIC_OR_DEPARTMENT_OTHER): Payer: Self-pay | Admitting: Nurse Practitioner

## 2020-11-12 ENCOUNTER — Encounter: Payer: Self-pay | Admitting: Emergency Medicine

## 2020-11-12 ENCOUNTER — Ambulatory Visit
Admission: EM | Admit: 2020-11-12 | Discharge: 2020-11-12 | Disposition: A | Discharge status: Home / Self Care | Payer: Medicare Other | Attending: Emergency Medicine | Admitting: Emergency Medicine

## 2020-11-12 ENCOUNTER — Other Ambulatory Visit: Payer: Self-pay

## 2020-11-12 DIAGNOSIS — M5442 Lumbago with sciatica, left side: Secondary | ICD-10-CM

## 2020-11-12 MED ORDER — KETOROLAC TROMETHAMINE 30 MG/ML IJ SOLN
30.0000 mg | Freq: Once | INTRAMUSCULAR | Status: AC
Start: 2020-11-12 — End: 2020-11-12
  Administered 2020-11-12: 30 mg via INTRAMUSCULAR

## 2020-11-12 MED ORDER — TIZANIDINE HCL 2 MG PO TABS: 2.0000 mg | ORAL_TABLET | Freq: Four times a day (QID) | ORAL | 0 refills | Status: DC | PRN

## 2020-11-12 MED ORDER — NAPROXEN 500 MG PO TABS: 500.0000 mg | ORAL_TABLET | Freq: Two times a day (BID) | ORAL | 0 refills | Status: DC

## 2020-11-12 NOTE — Discharge Instructions (Addendum)
We gave you a shot of Toradol Continue with Naprosyn twice daily with food Supplement with tizanidine at home/bedtime, this is a muscle relaxer, may cause drowsiness Alternate ice and heat Gentle stretching, avoid bedrest Follow-up with nonimprovement or worsening

## 2020-11-12 NOTE — ED Provider Notes (Signed)
[facility]    CSN: [id number] Arrival date & time: 11/12/20  1127      History   Chief Complaint Chief Complaint  Patient presents with   Leg Pain    HPI Paul Edwards is a 65 y.o. male presenting today for evaluation of left back/leg pain.  Symptoms began 2 days ago.  Reports history of similar in the past has also has had prior back surgery.  His daughter does report that he has been doing heavy lifting/bending recently.  Feels pain rating onto the left side.  Denies urinary symptoms of dysuria, hematuria.  Took Advil without full relief.  Recently was diagnosed with diabetes and has plans to follow-up with primary care tomorrow.  HPI  Past Medical History:  Diagnosis Date   Abdominal pain 02/09/2018   Allergy    Asthma    Cough 05/12/2008   Qualifier: Diagnosis of  By: Shelle Iron MD, Maree Krabbe Reactive airway disease likely   Encounter to establish care 07/31/2020   Epigastric pain 06/25/2020   GERD (gastroesophageal reflux disease)    Hypertension    Increased frequency of urination 02/22/2018   Thyroid disease     Patient Active Problem List   Diagnosis Date Noted   Type 2 diabetes mellitus with hyperglycemia, with long-term current use of insulin (HCC) 09/04/2020   Mixed hyperlipidemia 09/04/2020   Aortic atherosclerosis (HCC) 07/31/2020   Laboratory tests ordered as part of a complete physical exam (CPE) 07/31/2020   Piriformis syndrome of left side 07/31/2020   Hypothyroidism 07/31/2020   Body mass index 26.0-26.9, adult 07/31/2020   Asthma 06/25/2020   Colon cancer screening 06/25/2020   Helicobacter pylori infection 06/25/2020   Nausea and vomiting 06/25/2020   Lower urinary tract symptoms (LUTS) 02/22/2018   Benign prostatic hyperplasia with nocturia 01/08/2017   Degenerative disc disease, cervical 12/05/2016   Gastroesophageal reflux disease 12/05/2016   High blood pressure 05/12/2008    Past Surgical History:  Procedure Laterality Date   LUMBAR DISC  ARTHROPLASTY     [place]   SPINE SURGERY         Home Medications    Prior to Admission medications   Medication Sig Start Date End Date Taking? Authorizing Provider  albuterol (PROVENTIL) (2.5 MG/3ML) 0.083% nebulizer solution Take 3 mLs (2.5 mg total) by nebulization every 4 (four) hours as needed for up to 60 doses for wheezing or shortness of breath. 09/04/20  Yes Early, Sung Amabile, NP  amLODipine (NORVASC) 5 MG tablet Take 1 tablet (5 mg total) by mouth daily. 09/04/20  Yes Early, Sung Amabile, NP  fluticasone furoate-vilanterol (BREO ELLIPTA) 100-25 MCG/INH AEPB Inhale 1 puff into the lungs daily. 09/04/20  Yes Early, Sung Amabile, NP  levothyroxine (SYNTHROID) 25 MCG tablet Take 1 tablet (25 mcg total) by mouth daily before breakfast. 09/04/20  Yes Early, Sung Amabile, NP  naproxen (NAPROSYN) 500 MG tablet Take 1 tablet (500 mg total) by mouth 2 (two) times daily. 11/12/20  Yes Solina Heron C, PA-C  omeprazole (PRILOSEC) 40 MG capsule Take 1 capsule (40 mg total) by mouth daily. 09/04/20  Yes Early, Sung Amabile, NP  tiZANidine (ZANAFLEX) 2 MG tablet Take 1-2 tablets (2-4 mg total) by mouth every 6 (six) hours as needed for muscle spasms. 11/12/20  Yes Adabelle Griffiths, Ingram C, PA-C    Family History Family History  Problem Relation Age of Onset   Heart disease Sister 13       AMI one sister    Social  History Social History   Tobacco Use   Smoking status: Former    Years: 15.00    Types: Cigarettes   Smokeless tobacco: Never  Vaping Use   Vaping Use: Never used  Substance Use Topics   Alcohol use: Yes    Alcohol/week: 0.0 standard drinks    Comment: occ   Drug use: No     Allergies   Patient has no known allergies.   Review of Systems Review of Systems  Constitutional:  Negative for fatigue and fever.  Eyes:  Negative for redness, itching and visual disturbance.  Respiratory:  Negative for shortness of breath.   Cardiovascular:  Negative for chest pain and leg swelling.  Gastrointestinal:   Negative for nausea and vomiting.  Musculoskeletal:  Positive for back pain. Negative for arthralgias and myalgias.  Skin:  Negative for color change, rash and wound.  Neurological:  Negative for dizziness, syncope, weakness, light-headedness and headaches.    Physical Exam Triage Vital Signs ED Triage Vitals  Enc Vitals Group     BP 11/12/20 1139 138/78     Pulse Rate 11/12/20 1139 62     Resp --      Temp 11/12/20 1139 97.9 F (36.6 C)     Temp Source 11/12/20 1139 Oral     SpO2 11/12/20 1139 95 %     Weight 11/12/20 1141 176 lb (79.8 kg)     Height 11/12/20 1141 5\' 7"  (1.702 m)     Head Circumference --      Peak Flow --      Pain Score 11/12/20 1140 6     Pain Loc --      Pain Edu? --      Excl. in GC? --    No data found.  Updated Vital Signs BP 138/78 (BP Location: Right Arm)   Pulse 62   Temp 97.9 F (36.6 C) (Oral)   Ht 5\' 7"  (1.702 m)   Wt 176 lb (79.8 kg)   SpO2 95%   BMI 27.57 kg/m   Visual Acuity Right Eye Distance:   Left Eye Distance:   Bilateral Distance:    Right Eye Near:   Left Eye Near:    Bilateral Near:     Physical Exam Vitals and nursing note reviewed.  Constitutional:      Appearance: He is well-developed.     Comments: No acute distress  HENT:     Head: Normocephalic and atraumatic.     Nose: Nose normal.  Eyes:     Conjunctiva/sclera: Conjunctivae normal.  Cardiovascular:     Rate and Rhythm: Normal rate.  Pulmonary:     Effort: Pulmonary effort is normal. No respiratory distress.  Abdominal:     General: There is no distension.  Musculoskeletal:        General: Normal range of motion.     Cervical back: Neck supple.     Comments: Back: Well-healed surgical scar over superior lumbar area midline, no tenderness to palpation of lumbar spine midline, no palpable deformity or step-off, diffuse tenderness throughout left lumbar musculature extending into superior glued and anterior lateral thigh Strength at hips and knees 5/5  and equal bilaterally, patellar reflex 1+ bilaterally  Skin:    General: Skin is warm and dry.  Neurological:     Mental Status: He is alert and oriented to person, place, and time.     UC Treatments / Results  Labs (all labs ordered are listed, but only abnormal results  are displayed) Labs Reviewed - No data to display  EKG   Radiology No results found.  Procedures Procedures (including critical care time)  Medications Ordered in UC Medications  ketorolac (TORADOL) 30 MG/ML injection 30 mg (30 mg Intramuscular Given 11/12/20 1233)    Initial Impression / Assessment and Plan / UC Course  I have reviewed the triage vital signs and the nursing notes.  Pertinent labs & imaging results that were available during my care of the patient were reviewed by me and considered in my medical decision making (see chart for details).     Left-sided lower back pain with radicular distribution on left, no neurodeficits, no signs of cauda equina, treating with Toradol prior to discharge and continuing with NSAIDs and muscle relaxers.  Naprosyn prescribed.  Discussed activity modification, alternating ice and heat, gentle stretching.  Discussed strict return precautions. Patient verbalized understanding and is agreeable with plan.  Final Clinical Impressions(s) / UC Diagnoses   Final diagnoses:  Acute left-sided low back pain with left-sided sciatica     Discharge Instructions      We gave you a shot of Toradol Continue with Naprosyn twice daily with food Supplement with tizanidine at home/bedtime, this is a muscle relaxer, may cause drowsiness Alternate ice and heat Gentle stretching, avoid bedrest Follow-up with nonimprovement or worsening     ED Prescriptions     Medication Sig Dispense Auth. Provider   naproxen (NAPROSYN) 500 MG tablet Take 1 tablet (500 mg total) by mouth 2 (two) times daily. 30 tablet Donia Yokum C, PA-C   tiZANidine (ZANAFLEX) 2 MG tablet Take 1-2  tablets (2-4 mg total) by mouth every 6 (six) hours as needed for muscle spasms. 30 tablet Benuel Ly, Waves C, PA-C      PDMP not reviewed this encounter.   Lew Dawes, PA-C 11/12/20 1442

## 2020-11-12 NOTE — ED Triage Notes (Signed)
Patient states that he has a history of chronic leg/nerve pain, pain reoccurred 2 days ago.  No apparent injury to the leg.  Patient has taken Advil.

## 2020-11-13 ENCOUNTER — Encounter (HOSPITAL_BASED_OUTPATIENT_CLINIC_OR_DEPARTMENT_OTHER): Payer: Self-pay | Admitting: Nurse Practitioner

## 2020-11-13 ENCOUNTER — Other Ambulatory Visit: Payer: Self-pay

## 2020-11-13 ENCOUNTER — Ambulatory Visit (INDEPENDENT_AMBULATORY_CARE_PROVIDER_SITE_OTHER): Payer: Medicare Other | Admitting: Nurse Practitioner

## 2020-11-13 VITALS — BP 149/87 | HR 59 | Ht 68.0 in | Wt 180.2 lb

## 2020-11-13 DIAGNOSIS — E1165 Type 2 diabetes mellitus with hyperglycemia: Secondary | ICD-10-CM

## 2020-11-13 DIAGNOSIS — E039 Hypothyroidism, unspecified: Secondary | ICD-10-CM

## 2020-11-13 DIAGNOSIS — M5432 Sciatica, left side: Secondary | ICD-10-CM | POA: Insufficient documentation

## 2020-11-13 DIAGNOSIS — N401 Enlarged prostate with lower urinary tract symptoms: Secondary | ICD-10-CM

## 2020-11-13 DIAGNOSIS — E1169 Type 2 diabetes mellitus with other specified complication: Secondary | ICD-10-CM

## 2020-11-13 DIAGNOSIS — R3912 Poor urinary stream: Secondary | ICD-10-CM

## 2020-11-13 DIAGNOSIS — Z794 Long term (current) use of insulin: Secondary | ICD-10-CM

## 2020-11-13 DIAGNOSIS — E785 Hyperlipidemia, unspecified: Secondary | ICD-10-CM

## 2020-11-13 HISTORY — DX: Sciatica, left side: M54.32

## 2020-11-13 MED ORDER — TAMSULOSIN HCL 0.4 MG PO CAPS: 0.4000 mg | ORAL_CAPSULE | Freq: Every day | ORAL | 3 refills | Status: DC

## 2020-11-13 MED ORDER — GLUCOSE BLOOD VI STRP: ORAL_STRIP | 99 refills | Status: DC

## 2020-11-13 MED ORDER — METFORMIN HCL 500 MG PO TABS: 500.0000 mg | ORAL_TABLET | Freq: Two times a day (BID) | ORAL | 3 refills | Status: DC

## 2020-11-13 MED ORDER — GABAPENTIN 300 MG PO CAPS
300.0000 mg | ORAL_CAPSULE | Freq: Every day | ORAL | 1 refills | Status: DC
Start: 2020-11-13 — End: 2022-02-15

## 2020-11-13 MED ORDER — ATORVASTATIN CALCIUM 10 MG PO TABS: 20.0000 mg | ORAL_TABLET | Freq: Every day | ORAL | 3 refills | Status: DC

## 2020-11-13 NOTE — Progress Notes (Signed)
Established Patient Office Visit  Subjective:  Patient ID: Paul Edwards, male    DOB: [birth year]  Age: 65 y.o. MRN: [id number]  CC:  Chief Complaint  Patient presents with   Follow-up    Patient states follow up for thyroid, diabetes management and left leg.  Pain in left leg ranges from 6-8.   Diabetes    HPI Paul Edwards presents for follow-up for hypothyroidism and diabetes.  HYPOTHYROIDISM He was first diagnosed with hypothyroidism in April of this year.  He was started on low-dose levothyroxine. He reports since that time he has/has not been taking the medication daily as prescribed. He endorses symptoms of: fatigue, cold intolerance, and palpitations occasionally.    DIABETES He was first diagnosed with diabetes in April of this year. He has has not been checking his blood sugars He endorses symptoms of: nocturia He denies symptoms of: increased hunger or thirst. No slow healing wounds. No paresthesias He has been working on following a low carbohydrate, low-fat diet He is due for foot exam, and hemoglobin A1c today. He is overdue for dilated eye exam. He is due for both doses of the pneumonia vaccine.  SCIATIC PAIN He endorses left sided low back and leg pain with radiation down into the lateral portion of the distal lower extremity.  Symptoms present for about 3-4 days He has been working on building a shed and has been lifting quite a bit- daughter endorses he does not lift with his legs Was seen in UC yesterday for this and provided with muscle relaxer, Toradol injection, at home exercises and oral NSAIDs Reports pain is slightly improved with injection.   Past Medical History:  Diagnosis Date   Abdominal pain 02/09/2018   Allergy    Asthma    Cough 05/12/2008   Qualifier: Diagnosis of  By: Shelle Iron MD, Maree Krabbe Reactive airway disease likely   Encounter to establish care 07/31/2020   Epigastric pain 06/25/2020   GERD (gastroesophageal reflux disease)     Hypertension    Increased frequency of urination 02/22/2018   Thyroid disease     Past Surgical History:  Procedure Laterality Date   LUMBAR DISC ARTHROPLASTY     [place]   SPINE SURGERY      Family History  Problem Relation Age of Onset   Heart disease Sister 32       AMI one sister    Social History   Socioeconomic History   Marital status: Married    Spouse name: Not on file   Number of children: 3   Years of education: Not on file   Highest education level: Not on file  Occupational History   Occupation: Location manager  Tobacco Use   Smoking status: Former    Years: 15.00    Types: Cigarettes   Smokeless tobacco: Never  Vaping Use   Vaping Use: Never used  Substance and Sexual Activity   Alcohol use: Yes    Alcohol/week: 0.0 standard drinks    Comment: occ   Drug use: No   Sexual activity: Yes  Other Topics Concern   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 [employer]      Tobacco: quit smoking 2008; smoked for 20 years  Alcohol: none      Exercise: walking some      Seatbelt: 100%   Social Determinants of Corporate investment banker Strain: Not on file  Food Insecurity: Not on file  Transportation Needs: Not on file  Physical Activity: Not on file  Stress: Not on file  Social Connections: Not on file  Intimate Partner Violence: Not on file    Outpatient Medications Prior to Visit  Medication Sig Dispense Refill   albuterol (PROVENTIL) (2.5 MG/3ML) 0.083% nebulizer solution Take 3 mLs (2.5 mg total) by nebulization every 4 (four) hours as needed for up to 60 doses for wheezing or shortness of breath. 180 mL 5   amLODipine (NORVASC) 5 MG tablet Take 1 tablet (5 mg total) by mouth daily. 90 tablet 3   fluticasone furoate-vilanterol  (BREO ELLIPTA) 100-25 MCG/INH AEPB Inhale 1 puff into the lungs daily. 28 each 0   levothyroxine (SYNTHROID) 25 MCG tablet Take 1 tablet (25 mcg total) by mouth daily before breakfast. 30 tablet 3   naproxen (NAPROSYN) 500 MG tablet Take 1 tablet (500 mg total) by mouth 2 (two) times daily. 30 tablet 0   omeprazole (PRILOSEC) 40 MG capsule Take 1 capsule (40 mg total) by mouth daily. 90 capsule 3   tiZANidine (ZANAFLEX) 2 MG tablet Take 1-2 tablets (2-4 mg total) by mouth every 6 (six) hours as needed for muscle spasms. 30 tablet 0   No facility-administered medications prior to visit.    No Known Allergies  ROS Review of Systems    Objective:    Physical Exam Vitals and nursing note reviewed.  Constitutional:      Appearance: Normal appearance. He is normal weight.  HENT:     Head: Normocephalic.  Eyes:     Extraocular Movements: Extraocular movements intact.     Conjunctiva/sclera: Conjunctivae normal.     Pupils: Pupils are equal, round, and reactive to light.  Cardiovascular:     Rate and Rhythm: Normal rate and regular rhythm.     Pulses: Normal pulses.     Heart sounds: Normal heart sounds.  Pulmonary:     Effort: Pulmonary effort is normal.     Breath sounds: Normal breath sounds.  Musculoskeletal:        General: Tenderness present. Normal range of motion.     Cervical back: Normal range of motion.     Right lower leg: No edema.     Left lower leg: No edema.  Skin:    General: Skin is warm and dry.     Capillary Refill: Capillary refill takes less than 2 seconds.  Neurological:     General: No focal deficit present.     Mental Status: He is alert and oriented to person, place, and time.  Psychiatric:        Mood and Affect: Mood normal.        Behavior: Behavior normal.        Thought Content: Thought content normal.        Judgment: Judgment normal.    BP (!) 149/87   Pulse (!) 59   Ht 5\' 8"  (1.727 m)   Wt 180 lb 3.2 oz (81.7 kg)   SpO2 100%   BMI  27.40 kg/m  Wt Readings from Last 3 Encounters:  11/13/20 180 lb 3.2 oz (81.7 kg)  11/12/20 176 lb (79.8 kg)  09/04/20 177 lb 9.6 oz (80.6 kg)     Health Maintenance Due  Topic Date Due  COVID-19 Vaccine (1) Never done   FOOT EXAM  Never done   OPHTHALMOLOGY EXAM  Never done   URINE MICROALBUMIN  Never done   COLONOSCOPY (Pts [phone] Insurance coverage will need to be confirmed)  Never done   Zoster Vaccines- Shingrix (1 of 2) Never done   PNA vac Low Risk Adult (1 of 2 - PCV13) Never done    There are no preventive care reminders to display for this patient.  Lab Results  Component Value Date   TSH 4.970 (H) 07/31/2020   Lab Results  Component Value Date   WBC 8.5 07/31/2020   HGB 14.8 07/31/2020   HCT 46.7 07/31/2020   MCV 81.8 07/31/2020   PLT 193 07/31/2020   Lab Results  Component Value Date   NA 138 07/31/2020   K 4.0 07/31/2020   CO2 30 07/31/2020   GLUCOSE 84 07/31/2020   BUN 21 07/31/2020   CREATININE 0.89 07/31/2020   BILITOT 0.5 07/31/2020   ALKPHOS 80 07/31/2020   AST 57 (H) 07/31/2020   ALT 88 (H) 07/31/2020   PROT 7.5 07/31/2020   ALBUMIN 4.4 07/31/2020   CALCIUM 9.3 07/31/2020   ANIONGAP 8 07/31/2020   Lab Results  Component Value Date   CHOL 195 07/31/2020   Lab Results  Component Value Date   HDL 40 (L) 07/31/2020   Lab Results  Component Value Date   LDLCALC 78 07/31/2020   Lab Results  Component Value Date   TRIG 387 (H) 07/31/2020   Lab Results  Component Value Date   CHOLHDL 4.9 07/31/2020   Lab Results  Component Value Date   HGBA1C 6.6 (H) 07/31/2020      Assessment & Plan:   Problem List Items Addressed This Visit     Benign prostatic hyperplasia with weak urinary stream    Urinary hesitancy reported- historically on medication Restart tamsulosin today and monitor for improvement of symptoms.  F/U if symptoms worsen or fail to improve.        Relevant Medications   tamsulosin (FLOMAX) 0.4 MG CAPS  capsule   Hypothyroidism    Repeat TSH today- will make changes to medication once we have results Still having some symptoms that could indicate poor control.  F/U in 3 months       Type 2 diabetes mellitus with hyperglycemia, with long-term current use of insulin (HCC) - Primary    Start metformin 500mg  BID with meals Start atorvastatin 10mg  at bedtime Monitor blood sugar every AM with fasting levels. Goal 70-120 AM readings Goal BP 140/90 or less Diabetes information packet provided.  F/U in 3 months       Relevant Medications   metFORMIN (GLUCOPHAGE) 500 MG tablet   atorvastatin (LIPITOR) 10 MG tablet   glucose blood test strip   Other Relevant Orders   POCT glycosylated hemoglobin (Hb A1C)   Ambulatory referral to Ophthalmology   Sciatica of left side    Continue rest, ice, heat, and medications Gabapentin provided for nerve type pain at bedtime to help improve rest. Education that this can last for several days before improving.  No heavy lifting or bending at this time, but continue to do the stretches and exercises.  F/U if sx worsen or fail to improve.        Relevant Medications   gabapentin (NEURONTIN) 300 MG capsule   Hyperlipidemia associated with type 2 diabetes mellitus (HCC)    Start atorvastatin 10mg  at bedtime.  Labs drawn today Diabetes information  packet provided with dietary recommendations.  F/U in 3 months       Relevant Medications   metFORMIN (GLUCOPHAGE) 500 MG tablet   atorvastatin (LIPITOR) 10 MG tablet   Other Relevant Orders   Ambulatory referral to Ophthalmology    Meds ordered this encounter  Medications   metFORMIN (GLUCOPHAGE) 500 MG tablet    Sig: Take 1 tablet (500 mg total) by mouth 2 (two) times daily with a meal. For diabetes.    Dispense:  60 tablet    Refill:  3   atorvastatin (LIPITOR) 10 MG tablet    Sig: Take 2 tablets (20 mg total) by mouth daily. For diabetes and high cholesterol.    Dispense:  30 tablet     Refill:  3   gabapentin (NEURONTIN) 300 MG capsule    Sig: Take 1 capsule (300 mg total) by mouth at bedtime. For leg pain    Dispense:  30 capsule    Refill:  1   tamsulosin (FLOMAX) 0.4 MG CAPS capsule    Sig: Take 1 capsule (0.4 mg total) by mouth daily. For prostate    Dispense:  30 capsule    Refill:  3   glucose blood test strip    Sig: Use up to 4 times per day as directed for Contour Next EZ glucometer. Disp: 100. Refill x99    Dispense:  100 strip    Refill:  99    Follow-up: No follow-ups on file.    Tollie Eth, NP

## 2020-11-13 NOTE — Assessment & Plan Note (Signed)
Repeat TSH today- will make changes to medication once we have results Still having some symptoms that could indicate poor control.  F/U in 3 months

## 2020-11-13 NOTE — Assessment & Plan Note (Signed)
Start atorvastatin 10mg  at bedtime.  Labs drawn today Diabetes information packet provided with dietary recommendations.  F/U in 3 months

## 2020-11-13 NOTE — Assessment & Plan Note (Signed)
Start metformin 500mg  BID with meals Start atorvastatin 10mg  at bedtime Monitor blood sugar every AM with fasting levels. Goal 70-120 AM readings Goal BP 140/90 or less Diabetes information packet provided.  F/U in 3 months

## 2020-11-13 NOTE — Telephone Encounter (Signed)
Pts daughter called to change appt time for Pt

## 2020-11-13 NOTE — Assessment & Plan Note (Signed)
Continue rest, ice, heat, and medications Gabapentin provided for nerve type pain at bedtime to help improve rest. Education that this can last for several days before improving.  No heavy lifting or bending at this time, but continue to do the stretches and exercises.  F/U if sx worsen or fail to improve.

## 2020-11-13 NOTE — Assessment & Plan Note (Signed)
Urinary hesitancy reported- historically on medication Restart tamsulosin today and monitor for improvement of symptoms.  F/U if symptoms worsen or fail to improve.

## 2020-11-13 NOTE — Patient Instructions (Signed)
I have sent the following medication to the pharmacy:  Metformin 500mg  take one tablet with breakfast and one tablet with dinner every day.  For diabetes  Atrovastatin 10mg  take one tablet at bedtime every day. For high cholesterol and diabetes  Tamsulosin 0.4mg  take one tablet everyday For prostate  Gabapentin 300mg  take one tablet at bedtime as needed for nerve pain from back  I sent in the prescription for the test strips for the glucometer  I would like you to check your blood sugar every morning before breakfast and write this down and bring with you to your next visit. The goal blood sugar is 70-110 in the morning when you get up.

## 2020-11-14 LAB — COMPREHENSIVE METABOLIC PANEL
ALT: 93 IU/L — ABNORMAL HIGH (ref 0–44)
AST: 83 IU/L — ABNORMAL HIGH (ref 0–40)
Albumin/Globulin Ratio: 1.7 (ref 1.2–2.2)
Albumin: 4.3 g/dL (ref 3.8–4.8)
Alkaline Phosphatase: 112 IU/L (ref 44–121)
BUN/Creatinine Ratio: 13 (ref 10–24)
BUN: 13 mg/dL (ref 8–27)
Bilirubin Total: 0.4 mg/dL (ref 0.0–1.2)
CO2: 24 mmol/L (ref 20–29)
Calcium: 8.9 mg/dL (ref 8.6–10.2)
Chloride: 103 mmol/L (ref 96–106)
Creatinine, Ser: 1.03 mg/dL (ref 0.76–1.27)
Globulin, Total: 2.5 g/dL (ref 1.5–4.5)
Glucose: 132 mg/dL — ABNORMAL HIGH (ref 65–99)
Potassium: 4.1 mmol/L (ref 3.5–5.2)
Sodium: 141 mmol/L (ref 134–144)
Total Protein: 6.8 g/dL (ref 6.0–8.5)
eGFR: 81 mL/min/{1.73_m2} (ref >59)

## 2020-11-14 LAB — CBC WITH DIFFERENTIAL/PLATELET
Basophils Absolute: 0.1 10*3/uL (ref 0.0–0.2)
Basos: 1 %
EOS (ABSOLUTE): 0.3 10*3/uL (ref 0.0–0.4)
Eos: 6 %
Hematocrit: 42.6 % (ref 37.5–51.0)
Hemoglobin: 13.7 g/dL (ref 13.0–17.7)
Immature Grans (Abs): 0 10*3/uL (ref 0.0–0.1)
Immature Granulocytes: 1 %
Lymphocytes Absolute: 1.5 10*3/uL (ref 0.7–3.1)
Lymphs: 27 %
MCH: 26.8 pg (ref 26.6–33.0)
MCHC: 32.2 g/dL (ref 31.5–35.7)
MCV: 83 fL (ref 79–97)
Monocytes Absolute: 0.5 10*3/uL (ref 0.1–0.9)
Monocytes: 10 %
Neutrophils Absolute: 3.1 10*3/uL (ref 1.4–7.0)
Neutrophils: 55 %
Platelets: 194 10*3/uL (ref 150–450)
RBC: 5.12 x10E6/uL (ref 4.14–5.80)
RDW: 12.9 % (ref 11.6–15.4)
WBC: 5.5 10*3/uL (ref 3.4–10.8)

## 2020-11-14 LAB — THYROID PANEL WITH TSH
Free Thyroxine Index: 2.2 (ref 1.2–4.9)
T3 Uptake Ratio: 28 % (ref 24–39)
T4, Total: 7.8 ug/dL (ref 4.5–12.0)
TSH: 4.64 u[IU]/mL — ABNORMAL HIGH (ref 0.450–4.500)

## 2020-11-14 LAB — LIPID PANEL
Chol/HDL Ratio: 5.1 ratio — ABNORMAL HIGH (ref 0.0–5.0)
Cholesterol, Total: 168 mg/dL (ref 100–199)
HDL: 33 mg/dL — ABNORMAL LOW (ref >39)
LDL Chol Calc (NIH): 75 mg/dL (ref 0–99)
Triglycerides: 377 mg/dL — ABNORMAL HIGH (ref 0–149)
VLDL Cholesterol Cal: 60 mg/dL — ABNORMAL HIGH (ref 5–40)

## 2020-11-17 ENCOUNTER — Telehealth (HOSPITAL_BASED_OUTPATIENT_CLINIC_OR_DEPARTMENT_OTHER): Payer: Self-pay | Admitting: Nurse Practitioner

## 2020-11-17 NOTE — Telephone Encounter (Signed)
Please call [facility] to add A1c to labs. The order did not go in. Thank you.

## 2021-02-11 NOTE — Patient Instructions (Addendum)
Recommendations from today's visit:  Your Medications and what they are for Diabetes Treatment Metformin 500mg  - take 1 tablet with breakfast and 1 tablet with dinner every day High Blood Pressure Treatment Amlodipine 5mg  - take 1 tablet a day High Cholesterol Treatment Atrovastatin 10mg  - take 2 tablets a day (20mg  total) Thyroid Treatment Levothyroxine 30mcg- take 1 tablet on an empty stomach at least 30 minutes before eating every day.  Prostate Treatment Tamsulosin 0.4mg - take 1 tablet a day Asthma Albuterol- AS NEEDED EVERY 4 HOURS Trelegy Ellipta- ONCE A DAY- New  Recommended Vaccines and Screenings- Orders for these all sent today Flu vaccine - You received this today People over 76 years of age are at increased risk of complications and severe infection from the flu that could result in hospitalization or death. These instances increase when other conditions are present such as Diabetes or High Blood Pressure.  For these reasons, the flu vaccine every year is recommended for you. This vaccine takes approximately 2 weeks to develop enough immunity in your system to protect you from serious illness. It is very important that you take precautions even after receiving the vaccine to prevent illness from strains of the virus that are not covered with the vaccine- none are 100%.  You can protect yourself by frequently washing your hands, avoiding touching your face, wearing a mask, and avoiding being in close locations with persons who are or may be sick.  If you begin to feel ill with sinus pressure, sore throat, cough, congestion, body aches, and/or fever please contact the office for flu and COVID testing.  The flu is VERY prevalent in our area at this time along with several other viruses that can cause significant illness. Please be cautious and protect yourself.  Shingles Vaccine - I have given you a written prescription to take this to the pharmacy. This vaccine is for all adults  over 31 years old who have ever had or possibly had the chicken pox virus. This is also for people who had the chicken pox vaccine.  Shingles is a reactivation of the chicken pox virus that causes a painful rash with blisters that often comes up on one side of the body only and can spread in a linear fashion (looks similar to a line of red blisters). Shingles is contagious. You can get shingles more than once.  The vaccine helps to protect you from having an outbreak that can result in lifelong nerve pain in the area affected.  This vaccine is given at the pharmacy in 2 doses. I have written a prescription for you to take to the pharmacy to see if this is covered by your insurance.  Pneumonia Vaccine - You received this today People over 61 years old and those with certain medical conditions are more at risk of severe complications from pneumonia.  The pneumonia vaccine is recommended for all people in the age group above 44 to help better protect from severe symptoms that can lead to chronic lung problems, hospitalization, and death.  Pneumonia can be passed during any time of the year. This vaccine is a one time dose.  Colonoscopy - I have sent a referral for this. Someone will call you to schedule.  A colonoscopy is recommended every 10 years for all adults 73 years and older to screen for colon cancer.  Colon cancer is a high treatable form of cancer if caught Paul Edwards.  We recommend screening when there are no symptoms as this is usually  the most treatable stage.  Colon cancer is the second leading cause of cancer deaths in men and women.  Screening helps detect cells that may eventually turn into cancer and helps detect any Paul Edwards cancer cells so that treatment can be made before the disease is non-treatable.

## 2021-02-11 NOTE — Progress Notes (Signed)
Established Patient Office Visit  Subjective:  Patient ID: Paul Edwards, male    DOB: 1955/10/08  Age: 65 y.o. MRN: [id number]  CC:  Chief Complaint  Patient presents with   Follow-up    DM - patient reports to be doing well. He has a log of blood suagrs in office with him. Patient does have reduced sensation bilaterally on the top of his feet and tips of his toes. No apparent ulcer, scars, or wounds   Asthma    Patient presents with complaints of worsening asthma symptoms for the last month. He is becoming more short of breath with exertional activities like walking. He admits to using his albuterol nebulizer treatments and rescue inhaler but denies using his Breo. He states the neb treatments help alleviate symptoms for about an hour and then they return   Nodule    Patient has al ump/nodule on his right elbow that has been causing him pain for about 1 month. He states the area around the nodule has reduced sensation (numbness)    HPI Paul Edwards presents for 3 month follow-up for DM and Asthma. He is with his daughter who serves as an interpreter for him at his request.   Last labs were 11/13/2020:  TSH: 4.640 CMP: AST 83, ALT 93, Glucose 132 Lipid: Trig 377, HDL 33, LDL 75 CBC: WNL  Metformin 500mg  BID with meals Atorvastatin 20mg   Amlodipine 5mg  Levothyroxine Albuterol PRN  He is taking his medication daily as prescribed He has been checking blood sugars at home. Every morning fasting 86-134. He has not been checking his BP at home He no reports side effects from the medication He is monitoring his diet and reducing carbohydrates and fats and increasing fiber  He is exercising daily.  Increased hunger: no Increased thirst: no Weight gain: no Weight loss: no Fatigue: no Numbness/tingling in feet: yes- chronic Increased urinary output: no Vision changes: no CP: no ShOB: yes- asthma exacerbation Dizziness: no  Asthma He endorses increased ShOB with exertion  over the past several weeks.  He has been using his rescue inhaler and nebulizer treatments with albuterol, but this only helps for about an hour.  His cough is productive and green.  He has no known fever, chills, sinus pain or pressure.  He is wheezing all the time. His cough is worse in the mornings His asthma has limited his ability to comfortable take his daily walks. He has noted wheezing while resting. He is a former smoker- he quit about 20 years ago He is not using a maintenance inhaler at this time He has had an asthma diagnoses for many years, but cannot remember if he has had formal PFT's   No Known Allergies  ROS Review of Systems All review of systems negative except what is listed in the HPI    Objective:    Physical Exam Vitals and nursing note reviewed.  Constitutional:      General: He is not in acute distress.    Appearance: Normal appearance.  HENT:     Head: Normocephalic and atraumatic.     Nose: Nose normal.  Eyes:     General: Lids are normal. Vision grossly intact.     Extraocular Movements: Extraocular movements intact.     Conjunctiva/sclera: Conjunctivae normal.     Pupils: Pupils are equal, round, and reactive to light.     Funduscopic exam:    Right eye: No hemorrhage. Red reflex present.  Left eye: No hemorrhage. Red reflex present.    Visual Fields: Right eye visual fields normal and left eye visual fields normal.  Neck:     Vascular: No carotid bruit.  Cardiovascular:     Rate and Rhythm: Normal rate and regular rhythm.     Pulses: Normal pulses.     Heart sounds: Normal heart sounds. No murmur heard. Pulmonary:     Effort: Pulmonary effort is normal. No respiratory distress.     Breath sounds: No stridor. Wheezing and rhonchi present.  Chest:     Chest wall: No tenderness.  Abdominal:     General: Bowel sounds are normal. There is no distension.     Palpations: Abdomen is soft.     Tenderness: There is no abdominal  tenderness. There is no right CVA tenderness, left CVA tenderness or guarding.  Musculoskeletal:        General: Normal range of motion.     Cervical back: Normal range of motion.     Right lower leg: No edema.     Left lower leg: No edema.  Lymphadenopathy:     Cervical: No cervical adenopathy.  Skin:    General: Skin is warm and dry.     Capillary Refill: Capillary refill takes less than 2 seconds.  Neurological:     General: No focal deficit present.     Mental Status: He is alert and oriented to person, place, and time.  Psychiatric:        Mood and Affect: Mood normal.        Behavior: Behavior normal.        Thought Content: Thought content normal.        Judgment: Judgment normal.    BP 130/80   Pulse 61   Ht 5\' 8"  (1.727 m)   Wt 177 lb 12.8 oz (80.6 kg)   SpO2 97%   BMI 27.03 kg/m  Wt Readings from Last 3 Encounters:  02/12/21 177 lb 12.8 oz (80.6 kg)  11/13/20 180 lb 3.2 oz (81.7 kg)  11/12/20 176 lb (79.8 kg)     Assessment & Plan:   Problem List Items Addressed This Visit     High blood pressure    BP controlled today at 130/80. Not checking at home Compliant with medications and exercise No changes- f/u in 3 months Labs today      Relevant Orders   Lipid panel   TSH   VITAMIN D 25 Hydroxy (Vit-D Deficiency, Fractures)   Comprehensive metabolic panel   Pneumococcal conjugate vaccine 20-valent   Benign prostatic hyperplasia with weak urinary stream    PSA today On tamsulosin No concrens present.        Relevant Orders   PSA Total (Reflex To Free)   Pneumococcal conjugate vaccine 20-valent   Asthma    No known formal PFT's- concern for worsening symptoms and possibility of COPD given previous smoking status.  Exacerbation present today with significant wheezing on the right throughout and left in upper lobes.  Will start Trelegy Ellipta 200 today - education on use provided.  Sample provided today for patient. They will let me know if this is  not an affordable option for them once pricing is completed at the pharmacy.  Pulm referral placed for further evaluation and PFT's to confirm or alter diagnosis.  Will send azithromycin today for green phlegm and symptoms present for a month now for suspected infectious component possible.  Pna vaccine completed.  Relevant Medications   Fluticasone-Umeclidin-Vilant (TRELEGY ELLIPTA) 200-62.5-25 MCG/ACT AEPB   albuterol (VENTOLIN HFA) 108 (90 Base) MCG/ACT inhaler   Other Relevant Orders   Pneumococcal conjugate vaccine 20-valent   Ambulatory referral to Pulmonology   Hypothyroidism    Will monitor labs today Continue levothyroxine at current dose      Relevant Orders   TSH   Pneumococcal conjugate vaccine 20-valent   Type 2 diabetes mellitus with hyperglycemia, with long-term current use of insulin (HCC) - Primary    Fasting glucose appears well controlled based on at home readings. Medication and dietary adherence intact with daily activity.  Will monitor A1c today No changes to medications at this time.       Relevant Orders   POCT UA - Microalbumin   Lipid panel   [facility] DIABETES FOOT EXAM (Completed)   Comprehensive metabolic panel   Pneumococcal conjugate vaccine 20-valent   Hemoglobin A1c   Hyperlipidemia associated with type 2 diabetes mellitus (HCC)    Will monitor labs today Continue atrovastatin      Relevant Orders   POCT UA - Microalbumin   Lipid panel   Pneumococcal conjugate vaccine 20-valent   Other Visit Diagnoses     Need for influenza vaccination       Relevant Orders   Flu Vaccine QUAD High Dose(Fluad)   Screening for colon cancer       Relevant Orders   Ambulatory referral to Gastroenterology   Screening for prostate cancer       Relevant Orders   PSA Total (Reflex To Free)   Need for shingles vaccine       Relevant Medications   Zoster Vaccine Adjuvanted [facility]) injection   Subacute cough       Relevant Medications   azithromycin  (ZITHROMAX) 250 MG tablet   albuterol (VENTOLIN HFA) 108 (90 Base) MCG/ACT inhaler   benzonatate (TESSALON) 200 MG capsule   Vitamin D deficiency, unspecified        Relevant Orders   VITAMIN D 25 Hydroxy (Vit-D Deficiency, Fractures)   Encounter for immunization        Relevant Orders   Pneumococcal conjugate vaccine 20-valent       Meds ordered this encounter  Medications   Zoster Vaccine Adjuvanted [facility]) injection    Sig: Inject 0.5 mLs into the muscle once for 1 dose. Repeat in 2-6 months. Please fax confirmation of vaccination to [name], DNP at [phone]    Dispense:  0.5 mL    Refill:  1   Fluticasone-Umeclidin-Vilant (TRELEGY ELLIPTA) 200-62.5-25 MCG/ACT AEPB    Sig: Inhale 1 puff into the lungs daily. For asthma. NOT FOR RESCUE.    Dispense:  28 each    Refill:  11   azithromycin (ZITHROMAX) 250 MG tablet    Sig: Take 2 tablets on day 1, then 1 tablet daily on days 2 through 5    Dispense:  6 tablet    Refill:  0   albuterol (VENTOLIN HFA) 108 (90 Base) MCG/ACT inhaler    Sig: Inhale 2 puffs into the lungs every 4 (four) hours as needed for wheezing.    Dispense:  2 each    Refill:  11   benzonatate (TESSALON) 200 MG capsule    Sig: Take 1 capsule (200 mg total) by mouth 3 (three) times daily as needed for cough.    Dispense:  60 capsule    Refill:  3    Follow-up: Return in about 3 months (around 05/15/2021)  for Chronic Dz.   Time: 50 minutes, >50% spent counseling, care coordination, chart review, and documentation.    Tollie Eth, NP

## 2021-02-12 ENCOUNTER — Ambulatory Visit (INDEPENDENT_AMBULATORY_CARE_PROVIDER_SITE_OTHER): Payer: Medicare Other | Admitting: Nurse Practitioner

## 2021-02-12 ENCOUNTER — Encounter (HOSPITAL_BASED_OUTPATIENT_CLINIC_OR_DEPARTMENT_OTHER): Payer: Self-pay | Admitting: Nurse Practitioner

## 2021-02-12 ENCOUNTER — Other Ambulatory Visit: Payer: Self-pay

## 2021-02-12 VITALS — BP 130/80 | HR 61 | Ht 68.0 in | Wt 177.8 lb

## 2021-02-12 DIAGNOSIS — Z23 Encounter for immunization: Secondary | ICD-10-CM | POA: Diagnosis not present

## 2021-02-12 DIAGNOSIS — E785 Hyperlipidemia, unspecified: Secondary | ICD-10-CM

## 2021-02-12 DIAGNOSIS — Z794 Long term (current) use of insulin: Secondary | ICD-10-CM | POA: Diagnosis not present

## 2021-02-12 DIAGNOSIS — J4540 Moderate persistent asthma, uncomplicated: Secondary | ICD-10-CM

## 2021-02-12 DIAGNOSIS — E039 Hypothyroidism, unspecified: Secondary | ICD-10-CM | POA: Diagnosis not present

## 2021-02-12 DIAGNOSIS — E559 Vitamin D deficiency, unspecified: Secondary | ICD-10-CM

## 2021-02-12 DIAGNOSIS — J4541 Moderate persistent asthma with (acute) exacerbation: Secondary | ICD-10-CM

## 2021-02-12 DIAGNOSIS — Z125 Encounter for screening for malignant neoplasm of prostate: Secondary | ICD-10-CM

## 2021-02-12 DIAGNOSIS — E1165 Type 2 diabetes mellitus with hyperglycemia: Secondary | ICD-10-CM | POA: Diagnosis not present

## 2021-02-12 DIAGNOSIS — I10 Essential (primary) hypertension: Secondary | ICD-10-CM

## 2021-02-12 DIAGNOSIS — R052 Subacute cough: Secondary | ICD-10-CM

## 2021-02-12 DIAGNOSIS — R3912 Poor urinary stream: Secondary | ICD-10-CM

## 2021-02-12 DIAGNOSIS — N401 Enlarged prostate with lower urinary tract symptoms: Secondary | ICD-10-CM

## 2021-02-12 DIAGNOSIS — E1169 Type 2 diabetes mellitus with other specified complication: Secondary | ICD-10-CM

## 2021-02-12 DIAGNOSIS — Z1211 Encounter for screening for malignant neoplasm of colon: Secondary | ICD-10-CM

## 2021-02-12 LAB — POCT UA - MICROALBUMIN
Albumin/Creatinine Ratio, Urine, POC: <30
Creatinine, POC: 300 mg/dL
Microalbumin Ur, POC: 30 mg/L

## 2021-02-12 MED ORDER — TRELEGY ELLIPTA 200-62.5-25 MCG/ACT IN AEPB: 1.0000 | INHALATION_SPRAY | Freq: Every day | RESPIRATORY_TRACT | 11 refills | Status: DC

## 2021-02-12 MED ORDER — BENZONATATE 200 MG PO CAPS: 200.0000 mg | ORAL_CAPSULE | Freq: Three times a day (TID) | ORAL | 3 refills | Status: DC | PRN

## 2021-02-12 MED ORDER — AZITHROMYCIN 250 MG PO TABS: ORAL_TABLET | ORAL | 0 refills | Status: AC

## 2021-02-12 MED ORDER — ZOSTER VAC RECOMB ADJUVANTED 50 MCG/0.5ML IM SUSR: 0.5000 mL | Freq: Once | INTRAMUSCULAR | 1 refills | Status: AC

## 2021-02-12 MED ORDER — ALBUTEROL SULFATE HFA 108 (90 BASE) MCG/ACT IN AERS: 2.0000 | INHALATION_SPRAY | RESPIRATORY_TRACT | 11 refills | Status: DC | PRN

## 2021-02-12 NOTE — Assessment & Plan Note (Signed)
Will monitor labs today Continue levothyroxine at current dose

## 2021-02-12 NOTE — Assessment & Plan Note (Signed)
Fasting glucose appears well controlled based on at home readings. Medication and dietary adherence intact with daily activity.  Will monitor A1c today No changes to medications at this time.

## 2021-02-12 NOTE — Assessment & Plan Note (Signed)
PSA today On tamsulosin No concrens present.

## 2021-02-12 NOTE — Assessment & Plan Note (Signed)
>>  ASSESSMENT AND PLAN FOR ASTHMA WRITTEN ON 02/12/2021  9:22 AM BY Randle Shatzer E, NP  No known formal PFT's- concern for worsening symptoms and possibility of COPD given previous smoking status.  Exacerbation present today with significant wheezing on the right throughout and left in upper lobes.  Will start Trelegy Ellipta 200 today - education on use provided.  Sample provided today for patient. They will let me know if this is not an affordable option for them once pricing is completed at the pharmacy.  Pulm referral placed for further evaluation and PFT's to confirm or alter diagnosis.  Will send azithromycin today for green phlegm and symptoms present for a month now for suspected infectious component possible.  Pna vaccine completed.

## 2021-02-12 NOTE — Assessment & Plan Note (Signed)
Will monitor labs today Continue atrovastatin

## 2021-02-12 NOTE — Assessment & Plan Note (Signed)
BP controlled today at 130/80. Not checking at home Compliant with medications and exercise No changes- f/u in 3 months Labs today

## 2021-02-12 NOTE — Assessment & Plan Note (Signed)
No known formal PFT's- concern for worsening symptoms and possibility of COPD given previous smoking status.  Exacerbation present today with significant wheezing on the right throughout and left in upper lobes.  Will start Trelegy Ellipta 200 today - education on use provided.  Sample provided today for patient. They will let me know if this is not an affordable option for them once pricing is completed at the pharmacy.  Pulm referral placed for further evaluation and PFT's to confirm or alter diagnosis.  Will send azithromycin today for green phlegm and symptoms present for a month now for suspected infectious component possible.  Pna vaccine completed.

## 2021-02-13 LAB — LIPID PANEL
Chol/HDL Ratio: 3.5 ratio (ref 0.0–5.0)
Cholesterol, Total: 153 mg/dL (ref 100–199)
HDL: 44 mg/dL (ref >39)
LDL Chol Calc (NIH): 79 mg/dL (ref 0–99)
Triglycerides: 176 mg/dL — ABNORMAL HIGH (ref 0–149)
VLDL Cholesterol Cal: 30 mg/dL (ref 5–40)

## 2021-02-13 LAB — FPSA% REFLEX
% FREE PSA: 12 %
PSA, FREE: 0.96 ng/mL

## 2021-02-13 LAB — COMPREHENSIVE METABOLIC PANEL
ALT: 70 IU/L — ABNORMAL HIGH (ref 0–44)
AST: 64 IU/L — ABNORMAL HIGH (ref 0–40)
Albumin/Globulin Ratio: 1.6 (ref 1.2–2.2)
Albumin: 4.6 g/dL (ref 3.8–4.8)
Alkaline Phosphatase: 117 IU/L (ref 44–121)
BUN/Creatinine Ratio: 12 (ref 10–24)
BUN: 13 mg/dL (ref 8–27)
Bilirubin Total: 0.5 mg/dL (ref 0.0–1.2)
CO2: 23 mmol/L (ref 20–29)
Calcium: 9.6 mg/dL (ref 8.6–10.2)
Chloride: 102 mmol/L (ref 96–106)
Creatinine, Ser: 1.06 mg/dL (ref 0.76–1.27)
Globulin, Total: 2.8 g/dL (ref 1.5–4.5)
Glucose: 99 mg/dL (ref 70–99)
Potassium: 4.2 mmol/L (ref 3.5–5.2)
Sodium: 141 mmol/L (ref 134–144)
Total Protein: 7.4 g/dL (ref 6.0–8.5)
eGFR: 77 mL/min/{1.73_m2} (ref >59)

## 2021-02-13 LAB — HEMOGLOBIN A1C
Est. average glucose Bld gHb Est-mCnc: 146 mg/dL
Hgb A1c MFr Bld: 6.7 % — ABNORMAL HIGH (ref 4.8–5.6)

## 2021-02-13 LAB — PSA TOTAL (REFLEX TO FREE): Prostate Specific Ag, Serum: 8 ng/mL — ABNORMAL HIGH (ref 0.0–4.0)

## 2021-02-13 LAB — TSH: TSH: 3.93 u[IU]/mL (ref 0.450–4.500)

## 2021-02-13 LAB — VITAMIN D 25 HYDROXY (VIT D DEFICIENCY, FRACTURES): Vit D, 25-Hydroxy: 17.8 ng/mL — ABNORMAL LOW (ref 30.0–100.0)

## 2021-02-16 ENCOUNTER — Telehealth (HOSPITAL_BASED_OUTPATIENT_CLINIC_OR_DEPARTMENT_OTHER): Payer: Self-pay

## 2021-02-16 DIAGNOSIS — R972 Elevated prostate specific antigen [PSA]: Secondary | ICD-10-CM

## 2021-02-16 DIAGNOSIS — E559 Vitamin D deficiency, unspecified: Secondary | ICD-10-CM

## 2021-02-16 MED ORDER — VITAMIN D (ERGOCALCIFEROL) 1.25 MG (50000 UNIT) PO CAPS: 50000.0000 [IU] | ORAL_CAPSULE | ORAL | 0 refills | Status: DC

## 2021-02-16 NOTE — Telephone Encounter (Signed)
-----   Message from Tollie Eth, NP sent at 02/15/2021  8:24 AM EST ----- Please call patient (his daughter acts as his translator)  Much improvement in triglycerides. Keep working on low fat, low carbohydrate diet.   Thyroid levels in good control. No changes to medication  Vitamin D levels are very low. I recommend replacement with Vitamin D3 50,000 iU once a week for 12 weeks.  Please send medication to pharmacy for Vit D deficiency and we can recheck at next visit in 12 weeks for DM.   Liver function still slightly elevated, but better than in past. We will keep monitoring this.   PSA is elevated. I would like for him to see urology for evaluation of elevated PSA and % Free PSA. OK to send referral and let them know this has been sent.   A1c 6.7% showing pretty good control. No changes to his plan of care at this time. Keep doing what he is doing.

## 2021-02-16 NOTE — Telephone Encounter (Signed)
Per DPR left detailed message on daughters voicemail of lab results and recommendations Vit D sent to pharmacy Referral placed to urology

## 2021-05-18 ENCOUNTER — Ambulatory Visit (INDEPENDENT_AMBULATORY_CARE_PROVIDER_SITE_OTHER): Payer: Medicare Other | Admitting: Nurse Practitioner

## 2021-05-18 ENCOUNTER — Encounter (HOSPITAL_BASED_OUTPATIENT_CLINIC_OR_DEPARTMENT_OTHER): Payer: Self-pay | Admitting: Nurse Practitioner

## 2021-05-18 ENCOUNTER — Other Ambulatory Visit: Payer: Self-pay

## 2021-05-18 VITALS — BP 128/82 | HR 88 | Ht 68.0 in | Wt 180.4 lb

## 2021-05-18 DIAGNOSIS — R109 Unspecified abdominal pain: Secondary | ICD-10-CM

## 2021-05-18 DIAGNOSIS — I10 Essential (primary) hypertension: Secondary | ICD-10-CM | POA: Diagnosis not present

## 2021-05-18 DIAGNOSIS — I700 Atherosclerosis of aorta: Secondary | ICD-10-CM

## 2021-05-18 DIAGNOSIS — R3912 Poor urinary stream: Secondary | ICD-10-CM

## 2021-05-18 DIAGNOSIS — E1165 Type 2 diabetes mellitus with hyperglycemia: Secondary | ICD-10-CM

## 2021-05-18 DIAGNOSIS — Z794 Long term (current) use of insulin: Secondary | ICD-10-CM

## 2021-05-18 DIAGNOSIS — J4541 Moderate persistent asthma with (acute) exacerbation: Secondary | ICD-10-CM

## 2021-05-18 DIAGNOSIS — R052 Subacute cough: Secondary | ICD-10-CM | POA: Diagnosis not present

## 2021-05-18 DIAGNOSIS — E039 Hypothyroidism, unspecified: Secondary | ICD-10-CM

## 2021-05-18 DIAGNOSIS — E785 Hyperlipidemia, unspecified: Secondary | ICD-10-CM

## 2021-05-18 DIAGNOSIS — N401 Enlarged prostate with lower urinary tract symptoms: Secondary | ICD-10-CM

## 2021-05-18 DIAGNOSIS — E1169 Type 2 diabetes mellitus with other specified complication: Secondary | ICD-10-CM | POA: Diagnosis not present

## 2021-05-18 MED ORDER — ALBUTEROL SULFATE (2.5 MG/3ML) 0.083% IN NEBU: 2.5000 mg | INHALATION_SOLUTION | RESPIRATORY_TRACT | 5 refills | Status: AC | PRN

## 2021-05-18 MED ORDER — METFORMIN HCL 500 MG PO TABS: 500.0000 mg | ORAL_TABLET | Freq: Two times a day (BID) | ORAL | 3 refills | Status: DC

## 2021-05-18 MED ORDER — AZITHROMYCIN 250 MG PO TABS: ORAL_TABLET | ORAL | 0 refills | Status: AC

## 2021-05-18 MED ORDER — ALBUTEROL SULFATE HFA 108 (90 BASE) MCG/ACT IN AERS: 2.0000 | INHALATION_SPRAY | RESPIRATORY_TRACT | 11 refills | Status: DC | PRN

## 2021-05-18 MED ORDER — MONTELUKAST SODIUM 10 MG PO TABS: 10.0000 mg | ORAL_TABLET | Freq: Every day | ORAL | 3 refills | Status: DC

## 2021-05-18 MED ORDER — AMLODIPINE BESYLATE 5 MG PO TABS: 5.0000 mg | ORAL_TABLET | Freq: Every day | ORAL | 3 refills | Status: DC

## 2021-05-18 MED ORDER — OMEPRAZOLE 40 MG PO CPDR: 40.0000 mg | DELAYED_RELEASE_CAPSULE | Freq: Every day | ORAL | 3 refills | Status: DC

## 2021-05-18 MED ORDER — LEVOTHYROXINE SODIUM 25 MCG PO TABS: 25.0000 ug | ORAL_TABLET | Freq: Every day | ORAL | 3 refills | Status: DC

## 2021-05-18 MED ORDER — ATORVASTATIN CALCIUM 10 MG PO TABS: 20.0000 mg | ORAL_TABLET | Freq: Every day | ORAL | 3 refills | Status: DC

## 2021-05-18 MED ORDER — BENZONATATE 200 MG PO CAPS: 200.0000 mg | ORAL_CAPSULE | Freq: Three times a day (TID) | ORAL | 3 refills | Status: DC | PRN

## 2021-05-18 MED ORDER — TAMSULOSIN HCL 0.4 MG PO CAPS: 0.4000 mg | ORAL_CAPSULE | Freq: Every day | ORAL | 3 refills | Status: DC

## 2021-05-18 MED ORDER — DEXAMETHASONE SODIUM PHOSPHATE 10 MG/ML IJ SOLN
10.0000 mg | Freq: Once | INTRAMUSCULAR | Status: AC
  Administered 2021-05-18: 10 mg via INTRAMUSCULAR

## 2021-05-18 NOTE — Patient Instructions (Signed)
I would like you to start Singulair (montelukast) every night and take this at least during the winter months to help with the asthma.  I have also sent in a short antibiotic to start because you are having chills and the cough. Your lungs sound very wet to me and I am worried you might be getting an infection.

## 2021-05-18 NOTE — Progress Notes (Signed)
Established Patient Office Visit  Subjective:  Patient ID: Paul Edwards, male    DOB: Jul 08, 1955  Age: 66 y.o. MRN: [id number]  CC:  Chief Complaint  Patient presents with   Follow-up    Patient presents today for follow up of DM and asthma with his daughter. He is doing great with his sugar. He was given Ventolin at that last visit. He would like script, now that he has insurance. He stated he needed something for cough at bedtime.     HPI Paul Edwards presents for diabetes follow-up.   DM He tells me he is overall feeling pretty good with his blood sugars.  He tells me that he has been checking them at home and they are in the 1 teens to 120 range fasting.  He is not having any hypo or hyperglycemic episodes or symptoms.  He is monitoring his diet and working to decrease his carbohydrate intake.  He has not been able to be very physically active recently as he has been experiencing some shortness of breath associated with exacerbation of his asthma.  Asthma/cough Patient endorses exacerbation of cough and asthma symptoms recently.  He tells me he does feel dyspneic on exertion and he can hear wheezing at rest.  He has had a recent upper respiratory infection/cold.  He is not currently having any fevers but does endorse chills.  He is out of albuterol nebulizer and would like a refill on this today.  He also reports that he had good success with sample of Trelegy Ellipta that was provided during his last exacerbation.  He now has insurance and would like to see if he can get this in prescription form.  He denies any severe shortness of breath or difficulty catching his breath, needing to tripod for breathing, or severe dyspnea at rest.  Past Medical History:  Diagnosis Date   Abdominal pain 02/09/2018   Allergy    Asthma    Colon cancer screening 06/25/2020   Cough 05/12/2008   Qualifier: Diagnosis of  By: Shelle Iron MD, Maree Krabbe Reactive airway disease likely   Encounter to establish care  07/31/2020   Epigastric pain 06/25/2020   GERD (gastroesophageal reflux disease)    Helicobacter pylori infection 06/25/2020   Hypertension    Increased frequency of urination 02/22/2018   Laboratory tests ordered as part of a complete physical exam (CPE) 07/31/2020   Lower urinary tract symptoms (LUTS) 02/22/2018   Mixed hyperlipidemia 09/04/2020   Nausea and vomiting 06/25/2020   Piriformis syndrome of left side 07/31/2020   Sciatica of left side 11/13/2020   Thyroid disease       Outpatient Medications Prior to Visit  Medication Sig Dispense Refill   ondansetron (ZOFRAN-ODT) 4 MG disintegrating tablet 1 tab(s) orally 3 times a day for 30 days     omeprazole (PRILOSEC) 40 MG capsule 1 cap(s) orally once a day for 30 day(s)     Fluticasone-Umeclidin-Vilant (TRELEGY ELLIPTA) 200-62.5-25 MCG/ACT AEPB Inhale 1 puff into the lungs daily. For asthma. NOT FOR RESCUE. 28 each 11   gabapentin (NEURONTIN) 300 MG capsule Take 1 capsule (300 mg total) by mouth at bedtime. For leg pain 30 capsule 1   glucose blood test strip Use up to 4 times per day as directed for Contour Next EZ glucometer. Disp: 100. Refill x99 100 strip 99   Vitamin D, Ergocalciferol, (DRISDOL) 1.25 MG (50000 UNIT) CAPS capsule Take 1 capsule (50,000 Units total) by mouth every 7 (seven) days. 8 capsule  0   albuterol (PROVENTIL) (2.5 MG/3ML) 0.083% nebulizer solution Take 3 mLs (2.5 mg total) by nebulization every 4 (four) hours as needed for up to 60 doses for wheezing or shortness of breath. 180 mL 5   albuterol (VENTOLIN HFA) 108 (90 Base) MCG/ACT inhaler Inhale 2 puffs into the lungs every 4 (four) hours as needed for wheezing. 2 each 11   amLODipine (NORVASC) 5 MG tablet Take 1 tablet (5 mg total) by mouth daily. 90 tablet 3   atorvastatin (LIPITOR) 10 MG tablet Take 2 tablets (20 mg total) by mouth daily. For diabetes and high cholesterol. 30 tablet 3   benzonatate (TESSALON) 200 MG capsule Take 1 capsule (200 mg total) by mouth  3 (three) times daily as needed for cough. 60 capsule 3   levothyroxine (SYNTHROID) 25 MCG tablet Take 1 tablet (25 mcg total) by mouth daily before breakfast. 30 tablet 3   metFORMIN (GLUCOPHAGE) 500 MG tablet Take 1 tablet (500 mg total) by mouth 2 (two) times daily with a meal. For diabetes. 60 tablet 3   naproxen (NAPROSYN) 500 MG tablet Take 1 tablet (500 mg total) by mouth 2 (two) times daily. 30 tablet 0   omeprazole (PRILOSEC) 40 MG capsule Take 1 capsule (40 mg total) by mouth daily. 90 capsule 3   tamsulosin (FLOMAX) 0.4 MG CAPS capsule Take 1 capsule (0.4 mg total) by mouth daily. For prostate 30 capsule 3   tiZANidine (ZANAFLEX) 2 MG tablet Take 1-2 tablets (2-4 mg total) by mouth every 6 (six) hours as needed for muscle spasms. 30 tablet 0   No facility-administered medications prior to visit.    No Known Allergies  ROS Review of Systems All review of systems negative except what is listed in the HPI    Objective:    Physical Exam Vitals and nursing note reviewed.  Constitutional:      General: He is not in acute distress.    Appearance: Normal appearance.  HENT:     Head: Normocephalic and atraumatic.  Eyes:     General: Lids are normal. Vision grossly intact.     Extraocular Movements: Extraocular movements intact.     Conjunctiva/sclera: Conjunctivae normal.     Pupils: Pupils are equal, round, and reactive to light.     Funduscopic exam:    Right eye: No hemorrhage. Red reflex present.        Left eye: No hemorrhage. Red reflex present.    Visual Fields: Right eye visual fields normal and left eye visual fields normal.  Neck:     Vascular: No carotid bruit.  Cardiovascular:     Rate and Rhythm: Normal rate and regular rhythm.     Pulses: Normal pulses.     Heart sounds: Normal heart sounds. No murmur heard. Pulmonary:     Effort: Pulmonary effort is normal. Prolonged expiration present.     Breath sounds: Decreased air movement present. Examination of the  right-upper field reveals wheezing. Examination of the left-upper field reveals wheezing. Examination of the right-middle field reveals wheezing. Examination of the left-middle field reveals wheezing. Examination of the right-lower field reveals rhonchi. Examination of the left-lower field reveals rhonchi. Wheezing and rhonchi present.  Abdominal:     General: Bowel sounds are normal. There is no distension.     Palpations: Abdomen is soft.     Tenderness: There is no abdominal tenderness. There is no right CVA tenderness, left CVA tenderness or guarding.  Musculoskeletal:  General: Normal range of motion.     Cervical back: Normal range of motion. No tenderness.     Right lower leg: No edema.     Left lower leg: No edema.  Lymphadenopathy:     Cervical: No cervical adenopathy.  Skin:    General: Skin is warm and dry.     Capillary Refill: Capillary refill takes less than 2 seconds.  Neurological:     General: No focal deficit present.     Mental Status: He is alert and oriented to person, place, and time.  Psychiatric:        Mood and Affect: Mood normal.        Behavior: Behavior normal.        Thought Content: Thought content normal.        Judgment: Judgment normal.    BP 128/82    Pulse 88    Ht 5\' 8"  (1.727 m)    Wt 180 lb 6.4 oz (81.8 kg)    SpO2 (!) 71%    BMI 27.43 kg/m  Wt Readings from Last 3 Encounters:  05/18/21 180 lb 6.4 oz (81.8 kg)  02/12/21 177 lb 12.8 oz (80.6 kg)  11/13/20 180 lb 3.2 oz (81.7 kg)      Assessment & Plan:   Problem List Items Addressed This Visit     High blood pressure    Blood pressure well controlled at 128/82. No alarm symptoms present today. We will obtain labs today.  Recommend continuation of current medications. We will make changes to plan of care as necessary based on lab results.      Relevant Medications   amLODipine (NORVASC) 5 MG tablet   atorvastatin (LIPITOR) 10 MG tablet   Other Relevant Orders   CBC with  Differential (Completed)   Comprehensive metabolic panel (Completed)   Subacute cough    Subacute cough with dyspnea on exertion and chills. Patient does have a history of asthma however I do have concerns for possible COPD given that he is a former smoker. We will resend refills on albuterol inhaler and nebulizer today.  Recommend starting montelukast nightly for improved asthma control.  We will also send Trelegy Ellipta 200 mg dose for daily management as this was helpful for patient's symptoms in the past. Suspect possible community-acquired pneumonia given the patient's symptoms and presentation today.  We will go ahead and begin antibiotic therapy as he has been feeling poorly for several weeks now.  Recommend follow-up if symptoms do not improve.       Relevant Medications   benzonatate (TESSALON) 200 MG capsule   albuterol (VENTOLIN HFA) 108 (90 Base) MCG/ACT inhaler   azithromycin (ZITHROMAX) 250 MG tablet   Benign prostatic hyperplasia with weak urinary stream    Refill on tamsulosin today.  No alarm symptoms present.  Patient is doing well on current treatment.      Relevant Medications   tamsulosin (FLOMAX) 0.4 MG CAPS capsule   Asthma    Prior medical history of asthma currently not well controlled. Strong suspicion for COPD as patient is a former smoker and is having more frequent exacerbations. Evaluation today does show bilateral wheezing in the upper and middle lobes and rhonchi in the lower lobes bilaterally.  He does appear to be dyspneic with exertion however oxygen saturations are maintaining.  We will provide dexamethasone 10 mg injection today to help with inflammation and plan to start 200 mg Trelegy Ellipta daily as well as montelukast to see if  we can get better symptom control..  We will also send in antibiotic for suspected community-acquired pneumonia given the patient's symptoms and presence of chills. Patient aware to follow-up if symptoms worsen or fail to  improve. Consider pulmonology evaluation with spirometry once he has recovered from this acute illness.      Relevant Medications   montelukast (SINGULAIR) 10 MG tablet   albuterol (PROVENTIL) (2.5 MG/3ML) 0.083% nebulizer solution   albuterol (VENTOLIN HFA) 108 (90 Base) MCG/ACT inhaler   Aortic atherosclerosis (HCC) - Primary    We will obtain labs today to evaluate lipid control.  Recommend increased activity which is now currently limited with upper respiratory infection/respiratory difficulties. Slowly increase activity levels as tolerated.      Relevant Medications   amLODipine (NORVASC) 5 MG tablet   atorvastatin (LIPITOR) 10 MG tablet   Hypothyroidism    Will monitor labs today. Refills provided. No alarm symptoms present today.      Relevant Medications   levothyroxine (SYNTHROID) 25 MCG tablet   Type 2 diabetes mellitus with hyperglycemia, with long-term current use of insulin (HCC)    We will plan to monitor labs today. Patient reports blood sugar levels are well controlled at home and he is working on diet. Recommend increase activity of walking at least 20 minutes once a day. We will make changes to plan of care based on lab results.      Relevant Medications   atorvastatin (LIPITOR) 10 MG tablet   metFORMIN (GLUCOPHAGE) 500 MG tablet   Other Relevant Orders   HgB A1c (Completed)   Hyperlipidemia associated with type 2 diabetes mellitus (HCC)    Patient currently on statin therapy. We will obtain labs today for further evaluation. Recommend increase activity and monitoring of blood sugars and saturated fat intake for best management.      Relevant Medications   amLODipine (NORVASC) 5 MG tablet   atorvastatin (LIPITOR) 10 MG tablet   metFORMIN (GLUCOPHAGE) 500 MG tablet   Other Visit Diagnoses     Abdominal pain, unspecified abdominal location       Relevant Medications   omeprazole (PRILOSEC) 40 MG capsule       Meds ordered this encounter   Medications   montelukast (SINGULAIR) 10 MG tablet    Sig: Take 1 tablet (10 mg total) by mouth at bedtime.    Dispense:  30 tablet    Refill:  3   benzonatate (TESSALON) 200 MG capsule    Sig: Take 1 capsule (200 mg total) by mouth 3 (three) times daily as needed for cough.    Dispense:  60 capsule    Refill:  3   albuterol (PROVENTIL) (2.5 MG/3ML) 0.083% nebulizer solution    Sig: Take 3 mLs (2.5 mg total) by nebulization every 4 (four) hours as needed for up to 60 doses for wheezing or shortness of breath.    Dispense:  180 mL    Refill:  5   albuterol (VENTOLIN HFA) 108 (90 Base) MCG/ACT inhaler    Sig: Inhale 2 puffs into the lungs every 4 (four) hours as needed for wheezing.    Dispense:  2 each    Refill:  11   amLODipine (NORVASC) 5 MG tablet    Sig: Take 1 tablet (5 mg total) by mouth daily.    Dispense:  90 tablet    Refill:  3   atorvastatin (LIPITOR) 10 MG tablet    Sig: Take 2 tablets (20 mg total) by mouth daily.  For diabetes and high cholesterol.    Dispense:  30 tablet    Refill:  3   metFORMIN (GLUCOPHAGE) 500 MG tablet    Sig: Take 1 tablet (500 mg total) by mouth 2 (two) times daily with a meal. For diabetes.    Dispense:  60 tablet    Refill:  3   levothyroxine (SYNTHROID) 25 MCG tablet    Sig: Take 1 tablet (25 mcg total) by mouth daily before breakfast.    Dispense:  30 tablet    Refill:  3   tamsulosin (FLOMAX) 0.4 MG CAPS capsule    Sig: Take 1 capsule (0.4 mg total) by mouth daily. For prostate    Dispense:  30 capsule    Refill:  3   omeprazole (PRILOSEC) 40 MG capsule    Sig: Take 1 capsule (40 mg total) by mouth daily.    Dispense:  90 capsule    Refill:  3   azithromycin (ZITHROMAX) 250 MG tablet    Sig: Take 2 tablets on day 1, then 1 tablet daily on days 2 through 5    Dispense:  6 tablet    Refill:  0   dexamethasone (DECADRON) injection 10 mg    Follow-up: Return in about 3 months (around 08/15/2021) for Diabetes.    Tollie Eth,  NP

## 2021-05-19 ENCOUNTER — Encounter: Payer: Self-pay | Admitting: Nurse Practitioner

## 2021-05-19 DIAGNOSIS — E1165 Type 2 diabetes mellitus with hyperglycemia: Secondary | ICD-10-CM | POA: Diagnosis not present

## 2021-05-19 DIAGNOSIS — Z794 Long term (current) use of insulin: Secondary | ICD-10-CM | POA: Diagnosis not present

## 2021-05-19 DIAGNOSIS — I10 Essential (primary) hypertension: Secondary | ICD-10-CM | POA: Diagnosis not present

## 2021-05-20 ENCOUNTER — Encounter (HOSPITAL_BASED_OUTPATIENT_CLINIC_OR_DEPARTMENT_OTHER): Payer: Self-pay | Admitting: Nurse Practitioner

## 2021-05-20 LAB — CBC WITH DIFFERENTIAL/PLATELET
Basophils Absolute: 0.1 10*3/uL (ref 0.0–0.2)
Basos: 1 %
EOS (ABSOLUTE): 0.6 10*3/uL — ABNORMAL HIGH (ref 0.0–0.4)
Eos: 8 %
Hematocrit: 46.3 % (ref 37.5–51.0)
Hemoglobin: 14.5 g/dL (ref 13.0–17.7)
Immature Grans (Abs): 0.1 10*3/uL (ref 0.0–0.1)
Immature Granulocytes: 1 %
Lymphocytes Absolute: 1.7 10*3/uL (ref 0.7–3.1)
Lymphs: 23 %
MCH: 26 pg — ABNORMAL LOW (ref 26.6–33.0)
MCHC: 31.3 g/dL — ABNORMAL LOW (ref 31.5–35.7)
MCV: 83 fL (ref 79–97)
Monocytes Absolute: 0.7 10*3/uL (ref 0.1–0.9)
Monocytes: 9 %
Neutrophils Absolute: 4.3 10*3/uL (ref 1.4–7.0)
Neutrophils: 58 %
Platelets: 203 10*3/uL (ref 150–450)
RBC: 5.57 x10E6/uL (ref 4.14–5.80)
RDW: 13.1 % (ref 11.6–15.4)
WBC: 7.3 10*3/uL (ref 3.4–10.8)

## 2021-05-20 LAB — HEMOGLOBIN A1C
Est. average glucose Bld gHb Est-mCnc: 148 mg/dL
Hgb A1c MFr Bld: 6.8 % — ABNORMAL HIGH (ref 4.8–5.6)

## 2021-05-20 LAB — COMPREHENSIVE METABOLIC PANEL
ALT: 110 IU/L — ABNORMAL HIGH (ref 0–44)
AST: 104 IU/L — ABNORMAL HIGH (ref 0–40)
Albumin/Globulin Ratio: 1.5 (ref 1.2–2.2)
Albumin: 4.4 g/dL (ref 3.8–4.8)
Alkaline Phosphatase: 107 IU/L (ref 44–121)
BUN/Creatinine Ratio: 12 (ref 10–24)
BUN: 15 mg/dL (ref 8–27)
Bilirubin Total: 0.4 mg/dL (ref 0.0–1.2)
CO2: 23 mmol/L (ref 20–29)
Calcium: 9.6 mg/dL (ref 8.6–10.2)
Chloride: 99 mmol/L (ref 96–106)
Creatinine, Ser: 1.26 mg/dL (ref 0.76–1.27)
Globulin, Total: 3 g/dL (ref 1.5–4.5)
Glucose: 140 mg/dL — ABNORMAL HIGH (ref 70–99)
Potassium: 3.9 mmol/L (ref 3.5–5.2)
Sodium: 138 mmol/L (ref 134–144)
Total Protein: 7.4 g/dL (ref 6.0–8.5)
eGFR: 63 mL/min/{1.73_m2} (ref >59)

## 2021-05-20 NOTE — Assessment & Plan Note (Signed)
Prior medical history of asthma currently not well controlled. Strong suspicion for COPD as patient is a former smoker and is having more frequent exacerbations. Evaluation today does show bilateral wheezing in the upper and middle lobes and rhonchi in the lower lobes bilaterally.  He does appear to be dyspneic with exertion however oxygen saturations are maintaining.  We will provide dexamethasone 10 mg injection today to help with inflammation and plan to start 200 mg Trelegy Ellipta daily as well as montelukast to see if we can get better symptom control..  We will also send in antibiotic for suspected community-acquired pneumonia given the patient's symptoms and presence of chills. Patient aware to follow-up if symptoms worsen or fail to improve. Consider pulmonology evaluation with spirometry once he has recovered from this acute illness.

## 2021-05-20 NOTE — Assessment & Plan Note (Signed)
We will obtain labs today to evaluate lipid control.  Recommend increased activity which is now currently limited with upper respiratory infection/respiratory difficulties. Slowly increase activity levels as tolerated.

## 2021-05-20 NOTE — Assessment & Plan Note (Signed)
Subacute cough with dyspnea on exertion and chills. Patient does have a history of asthma however I do have concerns for possible COPD given that he is a former smoker. We will resend refills on albuterol inhaler and nebulizer today.  Recommend starting montelukast nightly for improved asthma control.  We will also send Trelegy Ellipta 200 mg dose for daily management as this was helpful for patient's symptoms in the past. Suspect possible community-acquired pneumonia given the patient's symptoms and presentation today.  We will go ahead and begin antibiotic therapy as he has been feeling poorly for several weeks now.  Recommend follow-up if symptoms do not improve.

## 2021-05-20 NOTE — Assessment & Plan Note (Signed)
Blood pressure well controlled at 128/82. No alarm symptoms present today. We will obtain labs today.  Recommend continuation of current medications. We will make changes to plan of care as necessary based on lab results.

## 2021-05-20 NOTE — Assessment & Plan Note (Signed)
We will plan to monitor labs today. Patient reports blood sugar levels are well controlled at home and he is working on diet. Recommend increase activity of walking at least 20 minutes once a day. We will make changes to plan of care based on lab results.

## 2021-05-20 NOTE — Assessment & Plan Note (Signed)
Will monitor labs today. Refills provided. No alarm symptoms present today.

## 2021-05-20 NOTE — Assessment & Plan Note (Signed)
Refill on tamsulosin today.  No alarm symptoms present.  Patient is doing well on current treatment.

## 2021-05-20 NOTE — Assessment & Plan Note (Signed)
Patient currently on statin therapy. We will obtain labs today for further evaluation. Recommend increase activity and monitoring of blood sugars and saturated fat intake for best management.

## 2021-05-20 NOTE — Assessment & Plan Note (Signed)
>>  ASSESSMENT AND PLAN FOR ASTHMA WRITTEN ON 05/20/2021 12:12 PM BY Caira Poche E, NP  Prior medical history of asthma currently not well controlled. Strong suspicion for COPD as patient is a former smoker and is having more frequent exacerbations. Evaluation today does show bilateral wheezing in the upper and middle lobes and rhonchi in the lower lobes bilaterally.  He does appear to be dyspneic with exertion however oxygen saturations are maintaining.  We will provide dexamethasone 10 mg injection today to help with inflammation and plan to start 200 mg Trelegy Ellipta daily as well as montelukast to see if we can get better symptom control..  We will also send in antibiotic for suspected community-acquired pneumonia given the patient's symptoms and presence of chills. Patient aware to follow-up if symptoms worsen or fail to improve. Consider pulmonology evaluation with spirometry once he has recovered from this acute illness.

## 2021-05-24 ENCOUNTER — Encounter: Payer: Self-pay | Admitting: Pulmonary Disease

## 2021-05-24 ENCOUNTER — Other Ambulatory Visit: Payer: Self-pay

## 2021-05-24 ENCOUNTER — Ambulatory Visit: Payer: Medicare Other | Admitting: Pulmonary Disease

## 2021-05-24 VITALS — BP 130/78 | HR 74 | Temp 98.4°F | Ht 68.0 in | Wt 180.0 lb

## 2021-05-24 DIAGNOSIS — J4540 Moderate persistent asthma, uncomplicated: Secondary | ICD-10-CM

## 2021-05-24 DIAGNOSIS — R0609 Other forms of dyspnea: Secondary | ICD-10-CM | POA: Diagnosis not present

## 2021-05-24 DIAGNOSIS — R052 Subacute cough: Secondary | ICD-10-CM

## 2021-05-24 MED ORDER — TRELEGY ELLIPTA 200-62.5-25 MCG/ACT IN AEPB: 1.0000 | INHALATION_SPRAY | Freq: Every day | RESPIRATORY_TRACT | 11 refills | Status: DC

## 2021-05-24 NOTE — Progress Notes (Signed)
@Patient  ID: , male    DOB: 1955/11/07, 66 y.o.   MRN: [id number]  Chief Complaint  Patient presents with   Consult    Consult for asthma. Pt states he has had asthma for 8-10 years. Pt states he has coughting, SOB, and wheezing.     Referring provider: [id number], NP  HPI:   66 y.o. man whom we are seeing in consultation for evaluation of asthma.  Most recent note from referring provider, PCP, reviewed.  Patient notes asthma for about 10 years.  Typically with symptoms of chest tightness, shortness of breath, cough in the colder months, winter.  Usually resolves as the weather warms and no issues through summer and early fall.  Then every year same cycle of worsening symptoms as the weather turns cold.  Has tried various inhalers in the past.  History not totally clear.  At least Advair.  Symptoms not really well controlled on Advair.  He has now and in the past on ICS/LABA using albuterol every 4-6 hours.  He does get relief with this.  Trelegy was recently prescribed by his PCP.  Sounds like a sample.  Has not picked up from the drugstore.  This did seem to help his symptoms quite a bit.  Unfortunate is not currently using.  Was seen last week and given a course of antibiotics and a steroid injection based on his exam and symptoms.  This has helped some.  No other alleviating or exacerbating factors that he can identify.  Most recent chest x-ray 07/25/2019 reviewed interpreted as clear lungs bilaterally.  PMH: Asthma, diabetes, hypertension Surgical history: Reviewed, he denies any Social history: Former smoker, lives in [place] Family history: Reviewed, denies respiratory illnesses in first-degree relatives   ACT:  No flowsheet data found.  MMRC: No flowsheet data found.  Epworth:  No flowsheet data found.  Tests:   FENO:  No results found for: NITRICOXIDE  PFT: No flowsheet data found.  WALK:  No flowsheet data found.  Imaging: Personally reviewed  and as per EMR discussion this note  Lab Results: Personally reviewed, notably elevated eosinophils CBC    Component Value Date/Time   WBC 7.3 05/19/2021 0813   WBC 8.5 07/31/2020 1204   RBC 5.57 05/19/2021 0813   RBC 5.71 07/31/2020 1204   HGB 14.5 05/19/2021 0813   HCT 46.3 05/19/2021 0813   PLT 203 05/19/2021 0813   MCV 83 05/19/2021 0813   MCH 26.0 (L) 05/19/2021 0813   MCH 25.9 (L) 07/31/2020 1204   MCHC 31.3 (L) 05/19/2021 0813   MCHC 31.7 07/31/2020 1204   RDW 13.1 05/19/2021 0813   LYMPHSABS 1.7 05/19/2021 0813   MONOABS 0.9 07/31/2020 1204   EOSABS 0.6 (H) 05/19/2021 0813   BASOSABS 0.1 05/19/2021 0813    BMET    Component Value Date/Time   NA 138 05/19/2021 0813   K 3.9 05/19/2021 0813   CL 99 05/19/2021 0813   CO2 23 05/19/2021 0813   GLUCOSE 140 (H) 05/19/2021 0813   GLUCOSE 84 07/31/2020 1204   BUN 15 05/19/2021 0813   CREATININE 1.26 05/19/2021 0813   CREATININE 1.07 06/10/2012 1358   CALCIUM 9.6 05/19/2021 0813   GFRNONAA >60 07/31/2020 1204   GFRAA >60 01/27/2019 0737    BNP No results found for: BNP  ProBNP No results found for: PROBNP  Specialty Problems       Pulmonary Problems   Subacute cough    Qualifier: Diagnosis of  By:  Clance MD, Maree Krabbe Reactive airway disease likely      Asthma    No Known Allergies  Immunization History  Administered Date(s) Administered   Fluad Quad(high Dose 65+) 02/12/2021   Influenza-Unspecified 02/10/2016   PNEUMOCOCCAL CONJUGATE-20 02/12/2021   Tdap 01/04/2017    Past Medical History:  Diagnosis Date   Abdominal pain 02/09/2018   Allergy    Asthma    Colon cancer screening 06/25/2020   Cough 05/12/2008   Qualifier: Diagnosis of  By: Shelle Iron MD, Maree Krabbe Reactive airway disease likely   Encounter to establish care 07/31/2020   Epigastric pain 06/25/2020   GERD (gastroesophageal reflux disease)    Helicobacter pylori infection 06/25/2020   Hypertension    Increased frequency of urination  02/22/2018   Laboratory tests ordered as part of a complete physical exam (CPE) 07/31/2020   Lower urinary tract symptoms (LUTS) 02/22/2018   Mixed hyperlipidemia 09/04/2020   Nausea and vomiting 06/25/2020   Piriformis syndrome of left side 07/31/2020   Sciatica of left side 11/13/2020   Thyroid disease     Tobacco History: Social History   Tobacco Use  Smoking Status Former   Years: 15.00   Types: Cigarettes  Smokeless Tobacco Never   Counseling given: Not Answered   Continue to not smoke  Outpatient Encounter Medications as of 05/24/2021  Medication Sig   albuterol (PROVENTIL) (2.5 MG/3ML) 0.083% nebulizer solution Take 3 mLs (2.5 mg total) by nebulization every 4 (four) hours as needed for up to 60 doses for wheezing or shortness of breath.   albuterol (VENTOLIN HFA) 108 (90 Base) MCG/ACT inhaler Inhale 2 puffs into the lungs every 4 (four) hours as needed for wheezing.   amLODipine (NORVASC) 5 MG tablet Take 1 tablet (5 mg total) by mouth daily.   atorvastatin (LIPITOR) 10 MG tablet Take 2 tablets (20 mg total) by mouth daily. For diabetes and high cholesterol.   benzonatate (TESSALON) 200 MG capsule Take 1 capsule (200 mg total) by mouth 3 (three) times daily as needed for cough.   gabapentin (NEURONTIN) 300 MG capsule Take 1 capsule (300 mg total) by mouth at bedtime. For leg pain   glucose blood test strip Use up to 4 times per day as directed for Contour Next EZ glucometer. Disp: 100. Refill x99   levothyroxine (SYNTHROID) 25 MCG tablet Take 1 tablet (25 mcg total) by mouth daily before breakfast.   metFORMIN (GLUCOPHAGE) 500 MG tablet Take 1 tablet (500 mg total) by mouth 2 (two) times daily with a meal. For diabetes.   montelukast (SINGULAIR) 10 MG tablet Take 1 tablet (10 mg total) by mouth at bedtime.   omeprazole (PRILOSEC) 40 MG capsule Take 1 capsule (40 mg total) by mouth daily.   ondansetron (ZOFRAN-ODT) 4 MG disintegrating tablet 1 tab(s) orally 3 times a day for 30  days   tamsulosin (FLOMAX) 0.4 MG CAPS capsule Take 1 capsule (0.4 mg total) by mouth daily. For prostate   Vitamin D, Ergocalciferol, (DRISDOL) 1.25 MG (50000 UNIT) CAPS capsule Take 1 capsule (50,000 Units total) by mouth every 7 (seven) days.   [DISCONTINUED] Fluticasone-Umeclidin-Vilant (TRELEGY ELLIPTA) 200-62.5-25 MCG/ACT AEPB Inhale 1 puff into the lungs daily.   Fluticasone-Umeclidin-Vilant (TRELEGY ELLIPTA) 200-62.5-25 MCG/ACT AEPB Inhale 1 puff into the lungs daily.   [DISCONTINUED] Fluticasone-Umeclidin-Vilant (TRELEGY ELLIPTA) 200-62.5-25 MCG/ACT AEPB Inhale 1 puff into the lungs daily. For asthma. NOT FOR RESCUE. (Patient not taking: Reported on 05/24/2021)   No facility-administered encounter medications on file as of  05/24/2021.     Review of Systems  Review of Systems  No chest pain with exertion.  No orthopnea or PND.  No lower extremity swelling.  Comprehensive review of systems otherwise negative. Physical Exam  BP 130/78 (BP Location: Right Arm, Patient Position: Sitting, Cuff Size: Normal)    Pulse 74    Temp 98.4 F (36.9 C) (Oral)    Ht 5\' 8"  (1.727 m)    Wt 180 lb (81.6 kg)    SpO2 100%    BMI 27.37 kg/m   Wt Readings from Last 5 Encounters:  05/24/21 180 lb (81.6 kg)  05/18/21 180 lb 6.4 oz (81.8 kg)  02/12/21 177 lb 12.8 oz (80.6 kg)  11/13/20 180 lb 3.2 oz (81.7 kg)  11/12/20 176 lb (79.8 kg)    BMI Readings from Last 5 Encounters:  05/24/21 27.37 kg/m  05/18/21 27.43 kg/m  02/12/21 27.03 kg/m  11/13/20 27.40 kg/m  11/12/20 27.57 kg/m     Physical Exam General: Well-appearing, sitting up in chair Eyes: EOMI, no icterus Neck: Supple, no JVP Pulmonary: Diffuse and expiratory wheeze, normal work of breathing Cardiovascular: Regular rate and rhythm, no murmur Abdomen: Nondistended, bowel sounds present MSK: No synovitis, joint effusion Neuro: Normal gait, no weakness Psych: Normal mood, full affect   Assessment & Plan:   Dyspnea, cough:  Most likely reflect poorly controlled asthma symptoms.  But improved with recent IM steroid injection.  Mild improvement with Trelegy that was tried for a few days.  Wheezing on exam today.  See below for asthma.  Asthma: Diagnosed some 10 years ago.  Historically poorly controlled through the winter months.  Minimal symptoms to the warmer months.  Recent trial of Trelegy for a few days which seemed to improve his symptoms.  Symptoms primarily chest tightness, occasional dyspnea, nocturnal cough.  High-dose Trelegy prescribed today.  Notably, eosinophils elevated to 600 recently, consider biologic therapy if not adequately improved with maximal inhaler therapy.   Return in about 3 months (around 08/21/2021).   08/23/2021, MD 05/24/2021

## 2021-05-24 NOTE — Patient Instructions (Signed)
Nice to meet you  I recommend we continue or retry Trelegy.  1 puff every day.  Rinse your mouth out with water after every use.  This is to help build the strongest foundation to help keep your lungs healthy and treat your asthma.  In the future, we may need to consider additional medications to help with asthma but I think it is really important to get you on the inhaler which is the strongest we have to help best treat your symptoms.  We will fill out paperwork today for manufacturing assistance for the Trelegy.  Return to clinic in 3 months or sooner as needed with Dr. Judeth Horn

## 2021-05-28 ENCOUNTER — Encounter (HOSPITAL_BASED_OUTPATIENT_CLINIC_OR_DEPARTMENT_OTHER): Payer: Self-pay

## 2021-07-22 ENCOUNTER — Ambulatory Visit (HOSPITAL_COMMUNITY)
Admission: EM | Admit: 2021-07-22 | Discharge: 2021-07-22 | Disposition: A | Discharge status: Home / Self Care | Payer: Medicare Other | Attending: Nurse Practitioner | Admitting: Nurse Practitioner

## 2021-07-22 ENCOUNTER — Encounter (HOSPITAL_COMMUNITY): Payer: Self-pay | Admitting: Emergency Medicine

## 2021-07-22 DIAGNOSIS — M109 Gout, unspecified: Secondary | ICD-10-CM

## 2021-07-22 MED ORDER — METHYLPREDNISOLONE SODIUM SUCC 125 MG IJ SOLR
60.0000 mg | Freq: Once | INTRAMUSCULAR | Status: AC
  Administered 2021-07-22: 60 mg via INTRAMUSCULAR

## 2021-07-22 MED ORDER — METHYLPREDNISOLONE SODIUM SUCC 125 MG IJ SOLR
INTRAMUSCULAR | Status: AC
  Filled 2021-07-22: qty 2

## 2021-07-22 MED ORDER — PREDNISONE 10 MG PO TABS: ORAL_TABLET | ORAL | 0 refills | Status: DC

## 2021-07-22 NOTE — ED Triage Notes (Signed)
Pt sis present today with left foot pain. Pt states that he has a hx of gout. Pt states that the discomfort started last week.  ?

## 2021-07-22 NOTE — ED Provider Notes (Signed)
?[facility] ? ? ? ?CSN: [id number] ?Arrival date & time: 07/22/21  1409 ? ? ?  ? ?History   ?Chief Complaint ?Chief Complaint  ?Patient presents with  ? Gout  ? ? ?HPI ?Paul Edwards is a 66 y.o. male.  ? ?Patient presents with toe pain on the left foot that has been ongoing since last Saturday.  Reports the pain is sharp and severe, even with light touch.  Patient reports history of gout of left great toe a few years ago.  Patient denies any recent changes in his dietary habits.  He denies recently drinking more alcohol.  He has taken Tylenol with no relief of symptoms.  He denies any numbness or tingling in his toes.  No radiation of pain from the toes.  No fevers, nausea/vomiting.  He is able to move the toes, however it is painful. ? ?Patient reports he has a visit coming up with his primary care provider early next week. ? ? ?Past Medical History:  ?Diagnosis Date  ? Abdominal pain 02/09/2018  ? Allergy   ? Asthma   ? Colon cancer screening 06/25/2020  ? Cough 05/12/2008  ? Qualifier: Diagnosis of  By: Shelle Iron MD, Maree Krabbe Reactive airway disease likely  ? Encounter to establish care 07/31/2020  ? Epigastric pain 06/25/2020  ? GERD (gastroesophageal reflux disease)   ? Helicobacter pylori infection 06/25/2020  ? Hypertension   ? Increased frequency of urination 02/22/2018  ? Laboratory tests ordered as part of a complete physical exam (CPE) 07/31/2020  ? Lower urinary tract symptoms (LUTS) 02/22/2018  ? Mixed hyperlipidemia 09/04/2020  ? Nausea and vomiting 06/25/2020  ? Piriformis syndrome of left side 07/31/2020  ? Sciatica of left side 11/13/2020  ? Thyroid disease   ? ? ?Patient Active Problem List  ? Diagnosis Date Noted  ? Hyperlipidemia associated with type 2 diabetes mellitus (HCC) 11/13/2020  ? Type 2 diabetes mellitus with hyperglycemia, with long-term current use of insulin (HCC) 09/04/2020  ? Aortic atherosclerosis (HCC) 07/31/2020  ? Hypothyroidism 07/31/2020  ? Body mass index 26.0-26.9, adult 07/31/2020   ? Asthma 06/25/2020  ? Benign prostatic hyperplasia with weak urinary stream 01/08/2017  ? Degenerative disc disease, cervical 12/05/2016  ? Gastroesophageal reflux disease 12/05/2016  ? High blood pressure 05/12/2008  ? Subacute cough 05/12/2008  ? ? ?Past Surgical History:  ?Procedure Laterality Date  ? LUMBAR DISC ARTHROPLASTY    ? [place]  ? SPINE SURGERY    ? ? ? ? ? ?Home Medications   ? ?Prior to Admission medications   ?Medication Sig Start Date End Date Taking? Authorizing Provider  ?predniSONE (DELTASONE) 10 MG tablet Take 40mg  on days 1-2. Take 30mg  on days 3-4. Take 20mg  on days 5-6. Take 10mg  on days 7-8. Take 5mg  on days 9-10, then stop. 07/22/21  Yes , NP  ?albuterol (PROVENTIL) (2.5 MG/3ML) 0.083% nebulizer solution Take 3 mLs (2.5 mg total) by nebulization every 4 (four) hours as needed for up to 60 doses for wheezing or shortness of breath. 05/18/21   , NP  ?albuterol (VENTOLIN HFA) 108 (90 Base) MCG/ACT inhaler Inhale 2 puffs into the lungs every 4 (four) hours as needed for wheezing. 05/18/21   07/24/21, NP  ?amLODipine (NORVASC) 5 MG tablet Take 1 tablet (5 mg total) by mouth daily. 05/18/21   07/16/21, NP  ?atorvastatin (LIPITOR) 10 MG tablet Take 2 tablets (20 mg total) by mouth daily. For diabetes and  high cholesterol. 05/18/21   Tollie EthEarly, Sara E, NP  ?Fluticasone-Umeclidin-Vilant (TRELEGY ELLIPTA) 200-62.5-25 MCG/ACT AEPB Inhale 1 puff into the lungs daily. 05/24/21   Hunsucker, Lesia SagoMatthew R, MD  ?gabapentin (NEURONTIN) 300 MG capsule Take 1 capsule (300 mg total) by mouth at bedtime. For leg pain 11/13/20   Early, Sung AmabileSara E, NP  ?glucose blood test strip Use up to 4 times per day as directed for Contour Next EZ glucometer. Disp: 100. Refill x99 11/13/20   Tollie EthEarly, Sara E, NP  ?levothyroxine (SYNTHROID) 25 MCG tablet Take 1 tablet (25 mcg total) by mouth daily before breakfast. 05/18/21   Early, Sung AmabileSara E, NP  ?metFORMIN (GLUCOPHAGE) 500 MG tablet Take 1 tablet (500 mg total)  by mouth 2 (two) times daily with a meal. For diabetes. 05/18/21   Tollie EthEarly, Sara E, NP  ?montelukast (SINGULAIR) 10 MG tablet Take 1 tablet (10 mg total) by mouth at bedtime. 05/18/21   Tollie EthEarly, Sara E, NP  ?omeprazole (PRILOSEC) 40 MG capsule Take 1 capsule (40 mg total) by mouth daily. 05/18/21   Tollie EthEarly, Sara E, NP  ?ondansetron (ZOFRAN-ODT) 4 MG disintegrating tablet 1 tab(s) orally 3 times a day for 30 days 09/17/18   [provider]  ?tamsulosin (FLOMAX) 0.4 MG CAPS capsule Take 1 capsule (0.4 mg total) by mouth daily. For prostate 05/18/21   Early, Sung AmabileSara E, NP  ?Vitamin D, Ergocalciferol, (DRISDOL) 1.25 MG (50000 UNIT) CAPS capsule Take 1 capsule (50,000 Units total) by mouth every 7 (seven) days. 02/16/21   Tollie EthEarly, Sara E, NP  ? ? ?Family History ?Family History  ?Problem Relation Age of Onset  ? Heart disease Sister [id number]  ?     AMI one sister  ? ? ?Social History ?Social History  ? ?Tobacco Use  ? Smoking status: Former  ?  Years: 15.00  ?  Types: Cigarettes  ? Smokeless tobacco: Never  ?Vaping Use  ? Vaping Use: Never used  ?Substance Use Topics  ? Alcohol use: Yes  ?  Alcohol/week: 0.0 standard drinks  ?  Comment: occ  ? Drug use: No  ? ? ? ?Allergies   ?Patient has no known allergies. ? ? ?Review of Systems ?Review of Systems ?Per HPI ? ?Physical Exam ?Triage Vital Signs ?ED Triage Vitals  ?Enc Vitals Group  ?   BP 07/22/21 1538 (!) 151/88  ?   Pulse Rate 07/22/21 1538 69  ?   Resp 07/22/21 1538 17  ?   Temp 07/22/21 1538 98.9 ?F (37.2 ?C)  ?   Temp src --   ?   SpO2 07/22/21 1538 97 %  ?   Weight --   ?   Height --   ?   Head Circumference --   ?   Peak Flow --   ?   Pain Score 07/22/21 1536 4  ?   Pain Loc --   ?   Pain Edu? --   ?   Excl. in GC? --   ? ?No data found. ? ?Updated Vital Signs ?BP (!) 151/88   Pulse 69   Temp 98.9 ?F (37.2 ?C)   Resp 17   SpO2 97%  ? ?Visual Acuity ?Right Eye Distance:   ?Left Eye Distance:   ?Bilateral Distance:   ? ?Right Eye Near:   ?Left Eye Near:    ?Bilateral Near:     ? ?Physical Exam ?Vitals and nursing note reviewed.  ?Constitutional:   ?   General: He is not in acute distress. ?  Appearance: Normal appearance. He is not toxic-appearing.  ?Pulmonary:  ?   Effort: Pulmonary effort is normal. No respiratory distress.  ?Musculoskeletal:  ?   Right ankle: Normal.  ?   Left ankle: Normal.  ?   Right foot: Normal.  ?   Left foot: Normal range of motion and normal capillary refill. Swelling, tenderness and bony tenderness present. Normal pulse.  ?   Comments: There is mild edema, slight erythema near first, second, third MTP joints.  No obvious deformity, tophi.    ?Skin: ?   General: Skin is warm and dry.  ?   Capillary Refill: Capillary refill takes less than 2 seconds.  ?   Coloration: Skin is not jaundiced or pale.  ?   Findings: No erythema.  ?Neurological:  ?   Mental Status: He is alert and oriented to person, place, and time.  ?Psychiatric:     ?   Behavior: Behavior is cooperative.  ? ? ? ?UC Treatments / Results  ?Labs ?(all labs ordered are listed, but only abnormal results are displayed) ?Labs Reviewed - No data to display ? ?EKG ? ? ?Radiology ?No results found. ? ?Procedures ?Procedures (including critical care time) ? ?Medications Ordered in UC ?Medications  ?methylPREDNISolone sodium succinate (SOLU-MEDROL) 125 mg/2 mL injection 60 mg (has no administration in time range)  ? ? ?Initial Impression / Assessment and Plan / UC Course  ?I have reviewed the triage vital signs and the nursing notes. ? ?Pertinent labs & imaging results that were available during my care of the patient were reviewed by me and considered in my medical decision making (see chart for details). ? ?  ?Treat for acute gout flare with Solu-Medrol 60 mg today in urgent care given severe pain.  Start prednisone taper tomorrow.  Discussed that this will temporarily raise blood sugars.  Encouraged discussing with primary care provider if gout flares become more recurrent preventative medication  Blood  pressure improved upon recheck today.  ?Final Clinical Impressions(s) / UC Diagnoses  ? ?Final diagnoses:  ?Acute gout involving toe of left foot, unspecified cause  ? ? ? ?Discharge Instructions   ? ?  ?- We h

## 2021-07-22 NOTE — Discharge Instructions (Addendum)
-   We have given you an injection of Solu-Medrol 60 mg today to help with the pain you are having in your foot. ?-Please start the prednisone taper tomorrow to help with the pain and inflammation ?-Please follow-up with your primary care provider if you would like to discuss preventative medication for gout ?

## 2021-08-01 ENCOUNTER — Ambulatory Visit (HOSPITAL_COMMUNITY)
Admission: EM | Admit: 2021-08-01 | Discharge: 2021-08-01 | Disposition: A | Discharge status: Home / Self Care | Payer: Medicare Other | Attending: Nurse Practitioner | Admitting: Nurse Practitioner

## 2021-08-01 ENCOUNTER — Encounter (HOSPITAL_COMMUNITY): Payer: Self-pay

## 2021-08-01 DIAGNOSIS — R059 Cough, unspecified: Secondary | ICD-10-CM | POA: Diagnosis not present

## 2021-08-01 DIAGNOSIS — J069 Acute upper respiratory infection, unspecified: Secondary | ICD-10-CM | POA: Diagnosis not present

## 2021-08-01 DIAGNOSIS — R0982 Postnasal drip: Secondary | ICD-10-CM | POA: Diagnosis not present

## 2021-08-01 DIAGNOSIS — Z20822 Contact with and (suspected) exposure to covid-19: Secondary | ICD-10-CM | POA: Insufficient documentation

## 2021-08-01 DIAGNOSIS — J029 Acute pharyngitis, unspecified: Secondary | ICD-10-CM | POA: Insufficient documentation

## 2021-08-01 MED ORDER — PROMETHAZINE-DM 6.25-15 MG/5ML PO SYRP: 5.0000 mL | ORAL_SOLUTION | Freq: Every evening | ORAL | 0 refills | Status: DC | PRN

## 2021-08-01 NOTE — Discharge Instructions (Addendum)
Your symptoms and exam findings are most consistent with a viral upper respiratory infection. These usually run their course in about 10 days.  If your symptoms last longer than 10 days without improvement, please follow up with your primary care provider.  If your symptoms, worsen, please go to the Emergency Room.   ? ?We have tested you today for COVID-19.  You will see the results in Mychart and we will call you with positive results.    Please stay home and isolate until you are aware of the results.  Please pick a refill of your albuterol inhaler from your pharmacy.  You can also use the prescription cough syrup at night time for your cough.   ? ?Some things that can make you feel better are: ?- Increased rest ?- Increasing fluid with water/sugar free electrolytes ?- Acetaminophen and ibuprofen as needed for fever/pain.  ?- Salt water gargling, chloraseptic spray and throat lozenges ?- OTC guaifenesin (Mucinex).  ?- Saline sinus flushes or a neti pot.  ?- Humidifying the air. ? ?

## 2021-08-01 NOTE — ED Provider Notes (Signed)
?[place] ? ? ? ?CSN: [id number] ?Arrival date & time: 08/01/21  1005 ? ? ?  ? ?History   ?Chief Complaint ?Chief Complaint  ?Patient presents with  ? Cough  ? ? ?HPI ?Paul Edwards is a 66 y.o. male.  ? ?Patient presents today with 2-day history of congested cough that is worse at nighttime, postnasal drip, runny nose sore throat, headache, and fatigue.  He reports he is not sleeping well at night because of the cough.  He denies shortness of breath, wheezing, chest pain or tightness, nasal congestion, headache, ear pain or pressure, nausea/vomiting, diarrhea, and change in appetite.  He denies any known sick contacts.  He has not taken anything for his symptoms. ? ?Of note, patient does have a history of possible reactive airway disease-he is taking Trelegy as prescribed.  He reports he ran out of his albuterol inhaler. ? ?Patient declines medical interpreter today. ? ? ?Past Medical History:  ?Diagnosis Date  ? Abdominal pain 02/09/2018  ? Allergy   ? Asthma   ? Colon cancer screening 06/25/2020  ? Cough 05/12/2008  ? Qualifier: Diagnosis of  By: Gwenette Greet MD, Armando Reichert Reactive airway disease likely  ? Encounter to establish care 07/31/2020  ? Epigastric pain 06/25/2020  ? GERD (gastroesophageal reflux disease)   ? Helicobacter pylori infection 06/25/2020  ? Hypertension   ? Increased frequency of urination 02/22/2018  ? Laboratory tests ordered as part of a complete physical exam (CPE) 07/31/2020  ? Lower urinary tract symptoms (LUTS) 02/22/2018  ? Mixed hyperlipidemia 09/04/2020  ? Nausea and vomiting 06/25/2020  ? Piriformis syndrome of left side 07/31/2020  ? Sciatica of left side 11/13/2020  ? Thyroid disease   ? ? ?Patient Active Problem List  ? Diagnosis Date Noted  ? Hyperlipidemia associated with type 2 diabetes mellitus ([place]) 11/13/2020  ? Type 2 diabetes mellitus with hyperglycemia, with long-term current use of insulin ([place]) 09/04/2020  ? Aortic atherosclerosis ([place]) 07/31/2020  ? Hypothyroidism 07/31/2020   ? Body mass index 26.0-26.9, adult 07/31/2020  ? Asthma 06/25/2020  ? Benign prostatic hyperplasia with weak urinary stream 01/08/2017  ? Degenerative disc disease, cervical 12/05/2016  ? Gastroesophageal reflux disease 12/05/2016  ? High blood pressure 05/12/2008  ? Subacute cough 05/12/2008  ? ? ?Past Surgical History:  ?Procedure Laterality Date  ? LUMBAR DISC ARTHROPLASTY    ? [place]  ? SPINE SURGERY    ? ? ? ? ? ?Home Medications   ? ?Prior to Admission medications   ?Medication Sig Start Date End Date Taking? Authorizing Provider  ?albuterol (PROVENTIL) (2.5 MG/3ML) 0.083% nebulizer solution Take 3 mLs (2.5 mg total) by nebulization every 4 (four) hours as needed for up to 60 doses for wheezing or shortness of breath. 05/18/21   Orma Render, NP  ?albuterol (VENTOLIN HFA) 108 (90 Base) MCG/ACT inhaler Inhale 2 puffs into the lungs every 4 (four) hours as needed for wheezing. 05/18/21   Orma Render, NP  ?amLODipine (NORVASC) 5 MG tablet Take 1 tablet (5 mg total) by mouth daily. 05/18/21   Orma Render, NP  ?atorvastatin (LIPITOR) 10 MG tablet Take 2 tablets (20 mg total) by mouth daily. For diabetes and high cholesterol. 05/18/21   Orma Render, NP  ?Fluticasone-Umeclidin-Vilant (TRELEGY ELLIPTA) 200-62.5-25 MCG/ACT AEPB Inhale 1 puff into the lungs daily. 05/24/21   Hunsucker, Bonna Gains, MD  ?gabapentin (NEURONTIN) 300 MG capsule Take 1 capsule (300 mg total) by mouth at bedtime. For leg pain  11/13/20   Tollie Eth, NP  ?glucose blood test strip Use up to 4 times per day as directed for Contour Next EZ glucometer. Disp: 100. Refill x99 11/13/20   Tollie Eth, NP  ?levothyroxine (SYNTHROID) 25 MCG tablet Take 1 tablet (25 mcg total) by mouth daily before breakfast. 05/18/21   Early, Sung Amabile, NP  ?metFORMIN (GLUCOPHAGE) 500 MG tablet Take 1 tablet (500 mg total) by mouth 2 (two) times daily with a meal. For diabetes. 05/18/21   Tollie Eth, NP  ?montelukast (SINGULAIR) 10 MG tablet Take 1 tablet (10 mg total) by  mouth at bedtime. 05/18/21   Tollie Eth, NP  ?omeprazole (PRILOSEC) 40 MG capsule Take 1 capsule (40 mg total) by mouth daily. 05/18/21   Tollie Eth, NP  ?ondansetron (ZOFRAN-ODT) 4 MG disintegrating tablet 1 tab(s) orally 3 times a day for 30 days 09/17/18   [provider]  ?promethazine-dextromethorphan (PROMETHAZINE-DM) 6.25-15 MG/5ML syrup Take 5 mLs by mouth at bedtime as needed for cough. 08/01/21  Yes Valentino Nose, NP  ?tamsulosin (FLOMAX) 0.4 MG CAPS capsule Take 1 capsule (0.4 mg total) by mouth daily. For prostate 05/18/21   Early, Sung Amabile, NP  ?Vitamin D, Ergocalciferol, (DRISDOL) 1.25 MG (50000 UNIT) CAPS capsule Take 1 capsule (50,000 Units total) by mouth every 7 (seven) days. 02/16/21   Tollie Eth, NP  ? ? ?Family History ?Family History  ?Problem Relation Age of Onset  ? Heart disease Sister 77  ?     AMI one sister  ? ? ?Social History ?Social History  ? ?Tobacco Use  ? Smoking status: Former  ?  Years: 15.00  ?  Types: Cigarettes  ? Smokeless tobacco: Never  ?Vaping Use  ? Vaping Use: Never used  ?Substance Use Topics  ? Alcohol use: Yes  ?  Alcohol/week: 0.0 standard drinks  ?  Comment: occ  ? Drug use: No  ? ? ? ?Allergies   ?Patient has no known allergies. ? ? ?Review of Systems ?Review of Systems ?Per HPI ? ?Physical Exam ?Triage Vital Signs ?ED Triage Vitals  ?Enc Vitals Group  ?   BP 08/01/21 1033 140/87  ?   Pulse Rate 08/01/21 1033 72  ?   Resp 08/01/21 1033 18  ?   Temp 08/01/21 1033 98.3 ?F (36.8 ?C)  ?   Temp Source 08/01/21 1033 Oral  ?   SpO2 08/01/21 1033 98 %  ?   Weight --   ?   Height --   ?   Head Circumference --   ?   Peak Flow --   ?   Pain Score 08/01/21 1031 0  ?   Pain Loc --   ?   Pain Edu? --   ?   Excl. in GC? --   ? ?No data found. ? ?Updated Vital Signs ?BP 140/87 (BP Location: Left Arm)   Pulse 72   Temp 98.3 ?F (36.8 ?C) (Oral)   Resp 18   SpO2 98%  ? ?Visual Acuity ?Right Eye Distance:   ?Left Eye Distance:   ?Bilateral Distance:   ? ?Right Eye  Near:   ?Left Eye Near:    ?Bilateral Near:    ? ?Physical Exam ?Vitals and nursing note reviewed.  ?Constitutional:   ?   General: He is not in acute distress. ?   Appearance: Normal appearance. He is not ill-appearing or toxic-appearing.  ?HENT:  ?   Head: Normocephalic and  atraumatic.  ?   Right Ear: Tympanic membrane, ear canal and external ear normal.  ?   Left Ear: Tympanic membrane, ear canal and external ear normal.  ?   Nose: No congestion or rhinorrhea.  ?   Mouth/Throat:  ?   Mouth: Mucous membranes are moist.  ?   Pharynx: Oropharynx is clear. Posterior oropharyngeal erythema present. No oropharyngeal exudate.  ?Eyes:  ?   General: No scleral icterus. ?   Extraocular Movements: Extraocular movements intact.  ?Cardiovascular:  ?   Rate and Rhythm: Normal rate and regular rhythm.  ?Pulmonary:  ?   Effort: Pulmonary effort is normal. No respiratory distress.  ?   Breath sounds: Normal breath sounds. No wheezing, rhonchi or rales.  ?Musculoskeletal:  ?   Cervical back: Normal range of motion and neck supple.  ?Lymphadenopathy:  ?   Cervical: No cervical adenopathy.  ?Skin: ?   General: Skin is warm and dry.  ?   Coloration: Skin is not jaundiced or pale.  ?   Findings: No erythema or rash.  ?Neurological:  ?   Mental Status: He is alert and oriented to person, place, and time.  ?Psychiatric:     ?   Mood and Affect: Mood normal.     ?   Behavior: Behavior normal.  ? ? ? ?UC Treatments / Results  ?Labs ?(all labs ordered are listed, but only abnormal results are displayed) ?Labs Reviewed  ?SARS CORONAVIRUS 2 (TAT 6-24 HRS)  ? ? ?EKG ? ? ?Radiology ?No results found. ? ?Procedures ?Procedures (including critical care time) ? ?Medications Ordered in UC ?Medications - No data to display ? ?Initial Impression / Assessment and Plan / UC Course  ?I have reviewed the triage vital signs and the nursing notes. ? ?Pertinent labs & imaging results that were available during my care of the patient were reviewed by me and  considered in my medical decision making (see chart for details). ? ?  ?Symptoms are consistent with acute viral upper respiratory illness.  Obtain COVID testing.  Reassured patient that symptoms and exam find

## 2021-08-01 NOTE — ED Triage Notes (Signed)
2 day h/o productive cough, runny nose and sore throat. Afebrile. ?Cough is interfering w/sleep. No meds taken. ?

## 2021-08-02 LAB — SARS CORONAVIRUS 2 (TAT 6-24 HRS): SARS Coronavirus 2: NEGATIVE

## 2021-08-04 ENCOUNTER — Encounter (HOSPITAL_COMMUNITY): Payer: Self-pay

## 2021-08-04 ENCOUNTER — Ambulatory Visit (HOSPITAL_COMMUNITY)
Admission: EM | Admit: 2021-08-04 | Discharge: 2021-08-04 | Disposition: A | Discharge status: Home / Self Care | Payer: Medicare Other | Attending: Nurse Practitioner | Admitting: Nurse Practitioner

## 2021-08-04 DIAGNOSIS — J4541 Moderate persistent asthma with (acute) exacerbation: Secondary | ICD-10-CM

## 2021-08-04 DIAGNOSIS — R051 Acute cough: Secondary | ICD-10-CM

## 2021-08-04 MED ORDER — IPRATROPIUM-ALBUTEROL 0.5-2.5 (3) MG/3ML IN SOLN
RESPIRATORY_TRACT | Status: AC
Start: 2021-08-04 — End: ?
  Filled 2021-08-04: qty 3

## 2021-08-04 MED ORDER — BENZONATATE 100 MG PO CAPS: 100.0000 mg | ORAL_CAPSULE | Freq: Three times a day (TID) | ORAL | 0 refills | Status: DC | PRN

## 2021-08-04 MED ORDER — IPRATROPIUM-ALBUTEROL 0.5-2.5 (3) MG/3ML IN SOLN
3.0000 mL | Freq: Once | RESPIRATORY_TRACT | Status: AC
  Administered 2021-08-04: 3 mL via RESPIRATORY_TRACT

## 2021-08-04 MED ORDER — PREDNISONE 20 MG PO TABS: 40.0000 mg | ORAL_TABLET | Freq: Every day | ORAL | 0 refills | Status: AC

## 2021-08-04 NOTE — Discharge Instructions (Addendum)
-   Please use the nebulizer machine/medicine at home every 4-6 hours as needed for wheezing, shortness of breath, or cough. ?-Please start the prednisone burst-40 mg for 5 days to help with the wheezing ?-You can use Tessalon Perles for cough 3 times a day as needed ?-Please make sure you are taking Trelegy daily, you have refills of this and the rescue inhaler waiting at your pharmacy ?

## 2021-08-04 NOTE — ED Triage Notes (Signed)
Pt presents for ongoing cough. He was seen on 08/01/2021 with the same cough. He is not getting any better.  ?

## 2021-08-04 NOTE — ED Provider Notes (Signed)
?[facility] ? ? ? ?CSN: [id number] ?Arrival date & time: 08/04/21  [id number] ? ? ?  ? ?History   ?Chief Complaint ?No chief complaint on file. ? ? ?HPI ?Paul Edwards is a 66 y.o. male.  ? ?Patient presents for 5 days of congested cough.  He was seen in urgent care on Sunday and reports he is not doing any better.  He reports the cough medicine we prescribed (promethazine-dextromethorphan) helps him sleep, however does not help with cough.  He is coughing up thick mucus, does have some shortness of breath, wheezing, and chest pain with coughing.  Denies any chest pain at rest, nasal congestion, runny nose, postnasal drip.  Denies sore throat.  He does have a headache when he coughs.  Denies fevers, body aches/chills, nausea/vomiting, diarrhea, change in appetite.  He is more tired because he is not sleeping well. ? ?He tells me today that he is not taking any inhalers.  It looks like he has previously prescribed Trelegy and an albuterol inhaler.  He reports he does not have either of these. ? ? ? ?Past Medical History:  ?Diagnosis Date  ? Abdominal pain 02/09/2018  ? Allergy   ? Asthma   ? Colon cancer screening 06/25/2020  ? Cough 05/12/2008  ? Qualifier: Diagnosis of  By: Shelle Ironlance MD, Maree KrabbeKeith M Reactive airway disease likely  ? Encounter to establish care 07/31/2020  ? Epigastric pain 06/25/2020  ? GERD (gastroesophageal reflux disease)   ? Helicobacter pylori infection 06/25/2020  ? Hypertension   ? Increased frequency of urination 02/22/2018  ? Laboratory tests ordered as part of a complete physical exam (CPE) 07/31/2020  ? Lower urinary tract symptoms (LUTS) 02/22/2018  ? Mixed hyperlipidemia 09/04/2020  ? Nausea and vomiting 06/25/2020  ? Piriformis syndrome of left side 07/31/2020  ? Sciatica of left side 11/13/2020  ? Thyroid disease   ? ? ?Patient Active Problem List  ? Diagnosis Date Noted  ? Hyperlipidemia associated with type 2 diabetes mellitus ([facility]) 11/13/2020  ? Type 2 diabetes mellitus with hyperglycemia, with  long-term current use of insulin ([facility]) 09/04/2020  ? Aortic atherosclerosis ([facility]) 07/31/2020  ? Hypothyroidism 07/31/2020  ? Body mass index 26.0-26.9, adult 07/31/2020  ? Asthma 06/25/2020  ? Benign prostatic hyperplasia with weak urinary stream 01/08/2017  ? Degenerative disc disease, cervical 12/05/2016  ? Gastroesophageal reflux disease 12/05/2016  ? High blood pressure 05/12/2008  ? Subacute cough 05/12/2008  ? ? ?Past Surgical History:  ?Procedure Laterality Date  ? LUMBAR DISC ARTHROPLASTY    ? [place]  ? SPINE SURGERY    ? ? ? ? ? ?Home Medications   ? ?Prior to Admission medications   ?Medication Sig Start Date End Date Taking? Authorizing Provider  ?benzonatate (TESSALON) 100 MG capsule Take 1 capsule (100 mg total) by mouth 3 (three) times daily as needed for cough. Do not take while driving or operating heavy machinery 08/04/21  Yes Valentino NoseMartinez, Calix Heinbaugh A, NP  ?predniSONE (DELTASONE) 20 MG tablet Take 2 tablets (40 mg total) by mouth daily with breakfast for 5 days. 08/04/21 08/09/21 Yes Valentino NoseMartinez, Elis Rawlinson A, NP  ?albuterol (PROVENTIL) (2.5 MG/3ML) 0.083% nebulizer solution Take 3 mLs (2.5 mg total) by nebulization every 4 (four) hours as needed for up to 60 doses for wheezing or shortness of breath. 05/18/21   Tollie EthEarly, Sara E, NP  ?albuterol (VENTOLIN HFA) 108 (90 Base) MCG/ACT inhaler Inhale 2 puffs into the lungs every 4 (four) hours as needed for wheezing. 05/18/21  Tollie Eth, NP  ?amLODipine (NORVASC) 5 MG tablet Take 1 tablet (5 mg total) by mouth daily. 05/18/21   Tollie Eth, NP  ?atorvastatin (LIPITOR) 10 MG tablet Take 2 tablets (20 mg total) by mouth daily. For diabetes and high cholesterol. 05/18/21   Tollie Eth, NP  ?Fluticasone-Umeclidin-Vilant (TRELEGY ELLIPTA) 200-62.5-25 MCG/ACT AEPB Inhale 1 puff into the lungs daily. 05/24/21   Hunsucker, Lesia Sago, MD  ?gabapentin (NEURONTIN) 300 MG capsule Take 1 capsule (300 mg total) by mouth at bedtime. For leg pain 11/13/20   Early, Sung Amabile, NP  ?glucose  blood test strip Use up to 4 times per day as directed for Contour Next EZ glucometer. Disp: 100. Refill x99 11/13/20   Tollie Eth, NP  ?levothyroxine (SYNTHROID) 25 MCG tablet Take 1 tablet (25 mcg total) by mouth daily before breakfast. 05/18/21   Early, Sung Amabile, NP  ?metFORMIN (GLUCOPHAGE) 500 MG tablet Take 1 tablet (500 mg total) by mouth 2 (two) times daily with a meal. For diabetes. 05/18/21   Tollie Eth, NP  ?montelukast (SINGULAIR) 10 MG tablet Take 1 tablet (10 mg total) by mouth at bedtime. 05/18/21   Tollie Eth, NP  ?omeprazole (PRILOSEC) 40 MG capsule Take 1 capsule (40 mg total) by mouth daily. 05/18/21   Tollie Eth, NP  ?ondansetron (ZOFRAN-ODT) 4 MG disintegrating tablet 1 tab(s) orally 3 times a day for 30 days 09/17/18   [provider]  ?promethazine-dextromethorphan (PROMETHAZINE-DM) 6.25-15 MG/5ML syrup Take 5 mLs by mouth at bedtime as needed for cough. 08/01/21   Valentino Nose, NP  ?tamsulosin (FLOMAX) 0.4 MG CAPS capsule Take 1 capsule (0.4 mg total) by mouth daily. For prostate 05/18/21   Early, Sung Amabile, NP  ?Vitamin D, Ergocalciferol, (DRISDOL) 1.25 MG (50000 UNIT) CAPS capsule Take 1 capsule (50,000 Units total) by mouth every 7 (seven) days. 02/16/21   Tollie Eth, NP  ? ? ?Family History ?Family History  ?Problem Relation Age of Onset  ? Heart disease Sister 74  ?     AMI one sister  ? ? ?Social History ?Social History  ? ?Tobacco Use  ? Smoking status: Former  ?  Years: 15.00  ?  Types: Cigarettes  ? Smokeless tobacco: Never  ?Vaping Use  ? Vaping Use: Never used  ?Substance Use Topics  ? Alcohol use: Yes  ?  Alcohol/week: 0.0 standard drinks  ?  Comment: occ  ? Drug use: No  ? ? ? ?Allergies   ?Patient has no known allergies. ? ? ?Review of Systems ?Review of Systems ?Per HPI ? ?Physical Exam ?Triage Vital Signs ?ED Triage Vitals [08/04/21 0822]  ?Enc Vitals Group  ?   BP (!) 167/96  ?   Pulse Rate 64  ?   Resp 16  ?   Temp 98.1 ?F (36.7 ?C)  ?   Temp Source Oral  ?   SpO2  95 %  ?   Weight   ?   Height   ?   Head Circumference   ?   Peak Flow   ?   Pain Score   ?   Pain Loc   ?   Pain Edu?   ?   Excl. in GC?   ? ?No data found. ? ?Updated Vital Signs ?BP (!) 167/96 (BP Location: Left Arm)   Pulse 64   Temp 98.1 ?F (36.7 ?C) (Oral)   Resp 16   SpO2 95%  ? ?  Visual Acuity ?Right Eye Distance:   ?Left Eye Distance:   ?Bilateral Distance:   ? ?Right Eye Near:   ?Left Eye Near:    ?Bilateral Near:    ? ?Physical Exam ?Vitals and nursing note reviewed.  ?Constitutional:   ?   General: He is not in acute distress. ?   Appearance: Normal appearance. He is not toxic-appearing.  ?HENT:  ?   Head: Normocephalic and atraumatic.  ?   Nose: Nose normal. No congestion.  ?Cardiovascular:  ?   Rate and Rhythm: Normal rate and regular rhythm.  ?Pulmonary:  ?   Effort: Pulmonary effort is normal. No respiratory distress.  ?   Breath sounds: Wheezing (expiratory wheezing throughout all lung fields) present. No rhonchi or rales.  ?Musculoskeletal:  ?   Cervical back: Normal range of motion.  ?Lymphadenopathy:  ?   Cervical: No cervical adenopathy.  ?Skin: ?   General: Skin is warm and dry.  ?   Capillary Refill: Capillary refill takes less than 2 seconds.  ?   Coloration: Skin is not jaundiced or pale.  ?   Findings: No erythema.  ?Neurological:  ?   Mental Status: He is alert and oriented to person, place, and time.  ?   Motor: No weakness.  ?   Gait: Gait normal.  ?Psychiatric:     ?   Behavior: Behavior is cooperative.  ? ? ? ?UC Treatments / Results  ?Labs ?(all labs ordered are listed, but only abnormal results are displayed) ?Labs Reviewed - No data to display ? ?EKG ? ? ?Radiology ?No results found. ? ?Procedures ?Procedures (including critical care time) ? ?Medications Ordered in UC ?Medications  ?ipratropium-albuterol (DUONEB) 0.5-2.5 (3) MG/3ML nebulizer solution 3 mL (3 mLs Nebulization Given 08/04/21 0835)  ? ? ?Initial Impression / Assessment and Plan / UC Course  ?I have reviewed the  triage vital signs and the nursing notes. ? ?Pertinent labs & imaging results that were available during my care of the patient were reviewed by me and considered in my medical decision making (see chart for details

## 2021-08-16 ENCOUNTER — Ambulatory Visit (HOSPITAL_BASED_OUTPATIENT_CLINIC_OR_DEPARTMENT_OTHER): Payer: Medicare Other | Admitting: Nurse Practitioner

## 2021-08-16 ENCOUNTER — Encounter (HOSPITAL_BASED_OUTPATIENT_CLINIC_OR_DEPARTMENT_OTHER): Payer: Self-pay | Admitting: Nurse Practitioner

## 2021-08-16 VITALS — BP 122/82 | HR 72 | Ht 67.0 in | Wt 182.0 lb

## 2021-08-16 DIAGNOSIS — M79672 Pain in left foot: Secondary | ICD-10-CM

## 2021-08-16 DIAGNOSIS — J4541 Moderate persistent asthma with (acute) exacerbation: Secondary | ICD-10-CM | POA: Diagnosis not present

## 2021-08-16 DIAGNOSIS — I10 Essential (primary) hypertension: Secondary | ICD-10-CM | POA: Diagnosis not present

## 2021-08-16 DIAGNOSIS — E1169 Type 2 diabetes mellitus with other specified complication: Secondary | ICD-10-CM | POA: Diagnosis not present

## 2021-08-16 DIAGNOSIS — E039 Hypothyroidism, unspecified: Secondary | ICD-10-CM | POA: Diagnosis not present

## 2021-08-16 DIAGNOSIS — R052 Subacute cough: Secondary | ICD-10-CM

## 2021-08-16 DIAGNOSIS — E785 Hyperlipidemia, unspecified: Secondary | ICD-10-CM

## 2021-08-16 DIAGNOSIS — R600 Localized edema: Secondary | ICD-10-CM | POA: Diagnosis not present

## 2021-08-16 DIAGNOSIS — Z794 Long term (current) use of insulin: Secondary | ICD-10-CM

## 2021-08-16 DIAGNOSIS — E1165 Type 2 diabetes mellitus with hyperglycemia: Secondary | ICD-10-CM

## 2021-08-16 HISTORY — DX: Pain in left foot: M79.672

## 2021-08-16 HISTORY — DX: Localized edema: R60.0

## 2021-08-16 MED ORDER — AZITHROMYCIN 250 MG PO TABS: ORAL_TABLET | ORAL | 0 refills | Status: AC

## 2021-08-16 MED ORDER — AMBULATORY NON FORMULARY MEDICATION: 1 refills | Status: DC

## 2021-08-16 NOTE — Patient Instructions (Addendum)
It was a pleasure seeing you today. I hope your time spent with [place] was pleasant and helpful. Please let us know if there is anything we can do to improve the service you receive.  ? ? ?I am going to see if I can find patient assistance information for the Trelegy so we can get that started.  ? ?I am going to check labs to make sure that there are no signs of gout present that could be causing some of the foot swelling.  ? ?I am also going to check your other labs. Great job with your blood sugars!! ? ?I have sent in medication for the cough. If this does not improve, let me know ? ? ? ?Important Office Information ?Lab Results ?If labs were ordered, please note that you will see results through MyChart as soon as they come available from [facility].  ?It takes up to 5 business days for the results to be routed to me and for me to review them once all of the lab results have come through from [facility]. I will make recommendations based on your results and send these through MyChart or someone from the office will call you to discuss. If your labs are abnormal, we may contact you to schedule a visit to discuss the results and make recommendations.  ?If you have not heard from [place] within 5 business days or you have waited longer than a week and your lab results have not come through on MyChart, please feel free to call the office or send a message through MyChart to follow-up on these labs.  ? ?Referrals ?If referrals were placed today, the office where the referral was sent will contact you either by phone or through MyChart to set up scheduling. Please note that it can take up to a week for the referral office to contact you. If you do not hear from them in a week, please contact the referral office directly to inquire about scheduling.  ? ?Condition Treated ?If your condition worsens or you begin to have new symptoms, please schedule a follow-up appointment for further evaluation. If you are not sure if an appointment is  needed, you may call the office to leave a message for the nurse and someone will contact you with recommendations.  ?If you have an urgent or life threatening emergency, please do not call the office, but seek emergency evaluation by calling 911 or going to the nearest emergency room for evaluation.  ? ?MyChart and Phone Calls ?Please do not use MyChart for urgent messages. It may take up to 3 business days for MyChart messages to be read by staff and if they are unable to handle the request, an additional 3 business days for them to be routed to me and for my response.  ?Messages sent to the provider through MyChart do not come directly to the provider, please allow time for these messages to be routed and for me to respond.  ?We get a large volume of MyChart messages daily and these are responded to in the order received.  ? ?For urgent messages, please call the office at [phone] and speak with the front office staff or leave a message on the line of my assistant for guidance.  ?We are seeing patients from the hours of 8:00 am through 5:00 pm and calls directly to the nurse may not be answered immediately due to seeing patients, but your call will be returned as soon as possible.  ?Phone  messages  received after 4:00 PM Monday through Thursday may not be returned until the following business day. Phone messages received after 11:00 AM on Friday may not be returned until Monday.  ? ?After Hours ?We share on call hours with providers from other offices. If you have an urgent need after hours that cannot wait until the next business day, please contact the on call provider by calling the office number. A nurse will speak with you and contact the provider if needed for recommendations.  ?If you have an urgent or life threatening emergency after hours, please do not call the on call provider, but seek emergency evaluation by calling 911 or going to the nearest emergency room for evaluation.  ? ?Paperwork ?All  paperwork requires a minimum of 5 days to complete and return to you or the designated personnel. Please keep this in mind when bringing in forms or sending requests for paperwork completion to the office.  ?  ?

## 2021-08-16 NOTE — Progress Notes (Signed)
?Orma Render, DNP, AGNP-c ?[facility] ?[place], [place] [address] ?[phone] ? ?Subjective:  ? ?Paul Edwards is a 66 y.o. male presents to day for follow-up of diabetes, hyperlipidemia, hypertension, and hypothyroidism ? ?PMH, Medications, and Allergies reviewed and updated in chart.  ? ?ROS negative except for what is listed in HPI. ?Objective:  ?BP 122/82   Pulse 72   Ht 5\' 7"  (1.702 m)   Wt 182 lb (82.6 kg)   SpO2 98%   BMI 28.51 kg/m?  ?Physical Exam ?Nursing note reviewed.  ?Constitutional:   ?   Appearance: Normal appearance. He is normal weight.  ?HENT:  ?   Head: Normocephalic.  ?Eyes:  ?   Extraocular Movements: Extraocular movements intact.  ?   Conjunctiva/sclera: Conjunctivae normal.  ?   Pupils: Pupils are equal, round, and reactive to light.  ?Neck:  ?   Vascular: No carotid bruit.  ?Cardiovascular:  ?   Rate and Rhythm: Normal rate and regular rhythm.  ?   Pulses: Normal pulses.  ?   Heart sounds: Normal heart sounds.  ?Pulmonary:  ?   Effort: Pulmonary effort is normal.  ?   Breath sounds: Wheezing and rhonchi present.  ?Abdominal:  ?   General: Bowel sounds are normal. There is no distension.  ?   Palpations: Abdomen is soft.  ?   Tenderness: There is no abdominal tenderness. There is no right CVA tenderness, left CVA tenderness or guarding.  ?Musculoskeletal:     ?   General: Swelling present.  ?   Cervical back: Normal range of motion and neck supple.  ?   Comments: Mild swelling noted to the dorsal surface of the left foot between the great toe and the second toe.  Appears to be healing bruising present in the area as well.  No significant erythema or warmth present  ?Lymphadenopathy:  ?   Cervical: No cervical adenopathy.  ?Skin: ?   General: Skin is warm and dry.  ?   Capillary Refill: Capillary refill takes less than 2 seconds.  ?   Findings: Bruising present.  ?Neurological:  ?   General: No focal deficit present.  ?    Mental Status: He is alert and oriented to person, place, and time.  ?   Sensory: No sensory deficit.  ?   Motor: No weakness.  ?Psychiatric:     ?   Mood and Affect: Mood normal.     ?   Behavior: Behavior normal.     ?   Thought Content: Thought content normal.     ?   Judgment: Judgment normal.  ? ? ? ?    ? ?Assessment & Plan:  ? ?Subacute cough/Asthma Exacerbation ?Present for approximately 3 weeks ?No sore throat, congestion, chills ?Potential subjective fevers ?Previously used trial of Trelegy which was very helpful for symptom management however this medication is very expensive ?Using albuterol inhaler several times a day ?Mucus clear and white ?Plan: ?We will send refill of albuterol today and start patient on azithromycin for suspected infectious cause given the length of time symptoms have been present.  Encourage use of Mucinex to help reduce increase mucus production ?We will work to see if we can find any financial help for Trelegy.   ?Monitor for new or worsening symptoms with completion of antibiotic and report immediately ? ?Primary hypertension ?No chest pain, shortness of breath, dizziness, vision changes, increased fatigue ?Currently taking  medication as prescribed ?No side effects of medication ?Monitoring a low-salt diet ?Has had some mild swelling in the left foot but this is also associated with tenderness ?Plan: ?Chronic.  Labs today for monitoring. No alarm symptoms present today. ?We will continue current medications. ?We will make changes to plan of care based on lab results as necessary. ? ?Type 2 diabetes mellitus with hyperglycemia, with long-term current use of insulin ([place]) ?Monitoring blood sugars at home ?Following a low carbohydrate low-sodium diet ?Mild exercise ?Average at home blood sugars are in the 110's - 120's fasting ?Is having some numbness and tingling in his feet but this has been chronic does not appear to be worsening ?No shortness of breath, vision changes,  increased urination, infections ?Plan: ?Chronic.  At home fasting blood sugars appear to be very well controlled at this time.  Patient praised for the changes he has made in his efforts. ?We will obtain labs today for evaluation. ?No alarm symptoms present at this time.  Continue current medications. ?We will make changes to plan of care based on lab findings as necessary. ?Plan follow-up in 3 months or sooner if needed. ? ?Hyperlipidemia associated with type 2 diabetes mellitus ([place]) ?No current symptoms ?Taking medication as prescribed ?No side effects of medications known ?Following low cholesterol diet ?Plan: ?Chronic.  No alarm symptoms present today.  Suspect that numbness and tingling is related to possible diabetic neuropathy.  Mild in nature at this time.  We will continue to monitor. ?Continue current medication regimen. ?We will obtain labs today for further evaluation and make recommendations changes to plan of care based on findings. ? ?Hypothyroidism, unspecified type ?Currently taking levothyroxine as directed ?No new symptoms present at this time ?No significant weight loss or gain, fatigue, hair loss, constipation, or palpitations ?Plan: ?Chronic.  Will obtain labs today for evaluation.  No alarm symptoms present at this time. ?Continue current medication regimen.  We will make changes to plan of care based on lab findings as necessary. ? ?Left foot pain and Localized edema ?Pain on the dorsal surface of the left foot between the great toe and the second toe ?No injury known ?There is a slight amount of swelling ?Endorses numbness and tingling chronically ?Has noticed this over the last 2 week ?Plan: ?New left foot pain on the dorsal surface with mild edema and tenderness noted between the great toe on the left toe.  Does not appear to be gout however patient is concerned for this therefore we will obtain labs today for further evaluation of uric acid levels. ?Suspect possible injury to the foot  could be contributing. ?Recommend icing the area keeping the foot elevated when seated, and utilizing compression stockings to help prevent further swelling. ?We will make changes to plan of care based on lab findings.  If symptoms worsen or fail to improve with conservative treatment will consider x-ray or orthopedic evaluation. ? ? ?History and Medication reviewed and updated this encounter.  ? ?Orma Render, DNP, AGNP-c ?08/20/2021  9:25 PM   ? ?

## 2021-08-17 LAB — HEMOGLOBIN A1C
Est. average glucose Bld gHb Est-mCnc: 157 mg/dL
Hgb A1c MFr Bld: 7.1 % — ABNORMAL HIGH (ref 4.8–5.6)

## 2021-08-17 LAB — T4, FREE: Free T4: 1.17 ng/dL (ref 0.82–1.77)

## 2021-08-17 LAB — CBC WITH DIFFERENTIAL/PLATELET
Basophils Absolute: 0.1 10*3/uL (ref 0.0–0.2)
Basos: 1 %
EOS (ABSOLUTE): 0.3 10*3/uL (ref 0.0–0.4)
Eos: 4 %
Hematocrit: 44.5 % (ref 37.5–51.0)
Hemoglobin: 14.3 g/dL (ref 13.0–17.7)
Immature Grans (Abs): 0.1 10*3/uL (ref 0.0–0.1)
Immature Granulocytes: 1 %
Lymphocytes Absolute: 1.5 10*3/uL (ref 0.7–3.1)
Lymphs: 18 %
MCH: 26.6 pg (ref 26.6–33.0)
MCHC: 32.1 g/dL (ref 31.5–35.7)
MCV: 83 fL (ref 79–97)
Monocytes Absolute: 1.1 10*3/uL — ABNORMAL HIGH (ref 0.1–0.9)
Monocytes: 12 %
Neutrophils Absolute: 5.7 10*3/uL (ref 1.4–7.0)
Neutrophils: 64 %
Platelets: 190 10*3/uL (ref 150–450)
RBC: 5.38 x10E6/uL (ref 4.14–5.80)
RDW: 12.9 % (ref 11.6–15.4)
WBC: 8.8 10*3/uL (ref 3.4–10.8)

## 2021-08-17 LAB — TSH: TSH: 2.79 u[IU]/mL (ref 0.450–4.500)

## 2021-08-17 LAB — COMPREHENSIVE METABOLIC PANEL
ALT: 63 IU/L — ABNORMAL HIGH (ref 0–44)
AST: 41 IU/L — ABNORMAL HIGH (ref 0–40)
Albumin/Globulin Ratio: 1.7 (ref 1.2–2.2)
Albumin: 4.3 g/dL (ref 3.8–4.8)
Alkaline Phosphatase: 132 IU/L — ABNORMAL HIGH (ref 44–121)
BUN/Creatinine Ratio: 14 (ref 10–24)
BUN: 14 mg/dL (ref 8–27)
Bilirubin Total: 0.4 mg/dL (ref 0.0–1.2)
CO2: 25 mmol/L (ref 20–29)
Calcium: 9.2 mg/dL (ref 8.6–10.2)
Chloride: 104 mmol/L (ref 96–106)
Creatinine, Ser: 0.98 mg/dL (ref 0.76–1.27)
Globulin, Total: 2.6 g/dL (ref 1.5–4.5)
Glucose: 122 mg/dL — ABNORMAL HIGH (ref 70–99)
Potassium: 4.3 mmol/L (ref 3.5–5.2)
Sodium: 141 mmol/L (ref 134–144)
Total Protein: 6.9 g/dL (ref 6.0–8.5)
eGFR: 85 mL/min/{1.73_m2} (ref >59)

## 2021-08-17 LAB — URIC ACID: Uric Acid: 5.6 mg/dL (ref 3.8–8.4)

## 2021-08-17 LAB — BRAIN NATRIURETIC PEPTIDE: BNP: 16.4 pg/mL (ref 0.0–100.0)

## 2021-08-18 ENCOUNTER — Other Ambulatory Visit (HOSPITAL_BASED_OUTPATIENT_CLINIC_OR_DEPARTMENT_OTHER): Payer: Self-pay | Admitting: Nurse Practitioner

## 2021-08-18 DIAGNOSIS — E1165 Type 2 diabetes mellitus with hyperglycemia: Secondary | ICD-10-CM

## 2021-08-18 MED ORDER — METFORMIN HCL 500 MG PO TABS: ORAL_TABLET | ORAL | 3 refills | Status: DC

## 2021-08-23 ENCOUNTER — Ambulatory Visit: Payer: Medicare Other | Admitting: Pulmonary Disease

## 2021-09-23 ENCOUNTER — Ambulatory Visit: Payer: Medicare Other | Admitting: Pulmonary Disease

## 2021-10-22 ENCOUNTER — Other Ambulatory Visit (HOSPITAL_BASED_OUTPATIENT_CLINIC_OR_DEPARTMENT_OTHER): Payer: Self-pay

## 2021-10-22 DIAGNOSIS — I10 Essential (primary) hypertension: Secondary | ICD-10-CM

## 2021-10-22 MED ORDER — AMLODIPINE BESYLATE 5 MG PO TABS: 5.0000 mg | ORAL_TABLET | Freq: Every day | ORAL | 3 refills | Status: DC

## 2021-10-27 ENCOUNTER — Telehealth (HOSPITAL_BASED_OUTPATIENT_CLINIC_OR_DEPARTMENT_OTHER): Payer: Self-pay | Admitting: Nurse Practitioner

## 2021-10-27 DIAGNOSIS — R109 Unspecified abdominal pain: Secondary | ICD-10-CM

## 2021-10-27 DIAGNOSIS — N401 Enlarged prostate with lower urinary tract symptoms: Secondary | ICD-10-CM

## 2021-10-27 DIAGNOSIS — E559 Vitamin D deficiency, unspecified: Secondary | ICD-10-CM

## 2021-10-27 NOTE — Telephone Encounter (Signed)
Pts daughter called and is wanting providers CMA to call back regarding a med refill. Pt did not leave what refill was needed on the Vm that was left. Please advise.

## 2021-10-29 MED ORDER — OMEPRAZOLE 40 MG PO CPDR: 40.0000 mg | DELAYED_RELEASE_CAPSULE | Freq: Every day | ORAL | 3 refills | Status: DC

## 2021-10-29 MED ORDER — TAMSULOSIN HCL 0.4 MG PO CAPS: 0.4000 mg | ORAL_CAPSULE | Freq: Every day | ORAL | 3 refills | Status: DC

## 2021-10-29 NOTE — Telephone Encounter (Signed)
Pt s daughter came in stated no one has reached out to her regarding pts refill request. Pt is needing these refilled today. Apologized to pt sending off as High priority.  Medications pt is needing: omeprazole (PRILOSEC) 40 MG capsule [[id number]]   tamsulosin (FLOMAX) 0.4 MG CAPS capsule [[id number]]    Send to: [id number] - [place], [place] - [address] AT [facility] OF [address]  Please advise.

## 2021-12-06 ENCOUNTER — Telehealth: Payer: Self-pay | Admitting: Pulmonary Disease

## 2021-12-06 DIAGNOSIS — J4540 Moderate persistent asthma, uncomplicated: Secondary | ICD-10-CM

## 2021-12-08 MED ORDER — TRELEGY ELLIPTA 200-62.5-25 MCG/ACT IN AEPB: 1.0000 | INHALATION_SPRAY | Freq: Every day | RESPIRATORY_TRACT | 11 refills | Status: DC

## 2021-12-08 NOTE — Telephone Encounter (Signed)
Trelegy refilled. Nothing further needed

## 2021-12-29 ENCOUNTER — Encounter: Payer: Self-pay | Admitting: Pulmonary Disease

## 2021-12-29 ENCOUNTER — Ambulatory Visit: Payer: Medicare Other | Admitting: Pulmonary Disease

## 2021-12-29 VITALS — BP 130/82 | HR 65 | Temp 98.3°F | Ht 67.0 in | Wt 179.0 lb

## 2021-12-29 DIAGNOSIS — R053 Chronic cough: Secondary | ICD-10-CM

## 2021-12-29 DIAGNOSIS — J4541 Moderate persistent asthma with (acute) exacerbation: Secondary | ICD-10-CM | POA: Diagnosis not present

## 2021-12-29 NOTE — Progress Notes (Signed)
@Patient  ID: , male    DOB: [birth year], 66 y.o.   MRN: [id number]  Chief complaint: Chronic cough  Referring provider: [id number], NP  HPI:   66 y.o. man whom we are seeing in follow up for evaluation of asthma.    Returns for overdue follow up. Seems like was a delay getting Trelegy. Started a few weeks ago.  He and daughter present today note marked improvement with symptoms of starting Trelegy.  Less cough.  Less congestion.  Using albuterol now infrequently.  Less than daily.  Using albuterol up to every 2 hours prior to starting Trelegy.  Very pleased with the improvement.  HPI at initial visit: Patient notes asthma for about 10 years.  Typically with symptoms of chest tightness, shortness of breath, cough in the colder months, winter.  Usually resolves as the weather warms and no issues through summer and early fall.  Then every year same cycle of worsening symptoms as the weather turns cold.  Has tried various inhalers in the past.  History not totally clear.  At least Advair.  Symptoms not really well controlled on Advair.  He has now and in the past on ICS/LABA using albuterol every 4-6 hours.  He does get relief with this.  Trelegy was recently prescribed by his PCP.  Sounds like a sample.  Has not picked up from the drugstore.  This did seem to help his symptoms quite a bit.  Unfortunate is not currently using.  Was seen last week and given a course of antibiotics and a steroid injection based on his exam and symptoms.  This has helped some.  No other alleviating or exacerbating factors that he can identify.  Most recent chest x-ray 07/25/2019 reviewed interpreted as clear lungs bilaterally.  PMH: Asthma, diabetes, hypertension Surgical history: Reviewed, he denies any Social history: Former smoker, lives in [place] Family history: Reviewed, denies respiratory illnesses in first-degree relatives   ACT:      No data to display          MMRC:     No data  to display          [place]:      No data to display          Tests:   FENO:  No results found for: "NITRICOXIDE"  PFT:     No data to display          WALK:      No data to display          Imaging: Personally reviewed and as per EMR discussion this note  Lab Results: Personally reviewed, notably elevated eosinophils CBC    Component Value Date/Time   WBC 8.8 08/16/2021 1528   WBC 8.5 07/31/2020 1204   RBC 5.38 08/16/2021 1528   RBC 5.71 07/31/2020 1204   HGB 14.3 08/16/2021 1528   HCT 44.5 08/16/2021 1528   PLT 190 08/16/2021 1528   MCV 83 08/16/2021 1528   MCH 26.6 08/16/2021 1528   MCH 25.9 (L) 07/31/2020 1204   MCHC 32.1 08/16/2021 1528   MCHC 31.7 07/31/2020 1204   RDW 12.9 08/16/2021 1528   LYMPHSABS 1.5 08/16/2021 1528   MONOABS 0.9 07/31/2020 1204   EOSABS 0.3 08/16/2021 1528   BASOSABS 0.1 08/16/2021 1528    BMET    Component Value Date/Time   NA 141 08/16/2021 1528   K 4.3 08/16/2021 1528   CL 104 08/16/2021 1528   CO2 25 08/16/2021  1528   GLUCOSE 122 (H) 08/16/2021 1528   GLUCOSE 84 07/31/2020 1204   BUN 14 08/16/2021 1528   CREATININE 0.98 08/16/2021 1528   CREATININE 1.07 06/10/2012 1358   CALCIUM 9.2 08/16/2021 1528   GFRNONAA >60 07/31/2020 1204   GFRAA >60 01/27/2019 0737    BNP    Component Value Date/Time   BNP 16.4 08/16/2021 1528    ProBNP No results found for: "PROBNP"  Specialty Problems       Pulmonary Problems   Subacute cough    Qualifier: Diagnosis of  By: Shelle Iron MD, Maree Krabbe Reactive airway disease likely      Asthma   Moderate persistent asthma with acute exacerbation    No Known Allergies  Immunization History  Administered Date(s) Administered   Fluad Quad(high Dose 65+) 02/12/2021   Influenza-Unspecified 02/10/2016   PNEUMOCOCCAL CONJUGATE-20 02/12/2021   Tdap 01/04/2017    Past Medical History:  Diagnosis Date   Abdominal pain 02/09/2018   Allergy    Asthma    Colon  cancer screening 06/25/2020   Cough 05/12/2008   Qualifier: Diagnosis of  By: Shelle Iron MD, Maree Krabbe Reactive airway disease likely   Encounter to establish care 07/31/2020   Epigastric pain 06/25/2020   GERD (gastroesophageal reflux disease)    Helicobacter pylori infection 06/25/2020   Hypertension    Increased frequency of urination 02/22/2018   Laboratory tests ordered as part of a complete physical exam (CPE) 07/31/2020   Lower urinary tract symptoms (LUTS) 02/22/2018   Mixed hyperlipidemia 09/04/2020   Nausea and vomiting 06/25/2020   Piriformis syndrome of left side 07/31/2020   Sciatica of left side 11/13/2020   Thyroid disease     Tobacco History: Social History   Tobacco Use  Smoking Status Former   Years: 15.00   Types: Cigarettes  Smokeless Tobacco Never   Counseling given: Not Answered   Continue to not smoke  Outpatient Encounter Medications as of 12/29/2021  Medication Sig   albuterol (PROVENTIL) (2.5 MG/3ML) 0.083% nebulizer solution Take 3 mLs (2.5 mg total) by nebulization every 4 (four) hours as needed for up to 60 doses for wheezing or shortness of breath.   albuterol (VENTOLIN HFA) 108 (90 Base) MCG/ACT inhaler Inhale 2 puffs into the lungs every 4 (four) hours as needed for wheezing.   AMBULATORY NON FORMULARY MEDICATION Compression stockings 15-82mmHg. To be worn for 12 hours then removed for 12 hours daily. For edema, diabetes   amLODipine (NORVASC) 5 MG tablet Take 1 tablet (5 mg total) by mouth daily.   atorvastatin (LIPITOR) 10 MG tablet Take 2 tablets (20 mg total) by mouth daily. For diabetes and high cholesterol.   benzonatate (TESSALON) 100 MG capsule Take 1 capsule (100 mg total) by mouth 3 (three) times daily as needed for cough. Do not take while driving or operating heavy machinery   Fluticasone-Umeclidin-Vilant (TRELEGY ELLIPTA) 200-62.5-25 MCG/ACT AEPB Inhale 1 puff into the lungs daily.   gabapentin (NEURONTIN) 300 MG capsule Take 1 capsule (300 mg total)  by mouth at bedtime. For leg pain   glucose blood test strip Use up to 4 times per day as directed for Contour Next EZ glucometer. Disp: 100. Refill x99   levothyroxine (SYNTHROID) 25 MCG tablet Take 1 tablet (25 mcg total) by mouth daily before breakfast.   metFORMIN (GLUCOPHAGE) 500 MG tablet Take 2 tablets (1,000 mg total) by mouth daily with breakfast AND 1 tablet (500 mg total) daily with supper. For diabetes..   montelukast (  SINGULAIR) 10 MG tablet Take 1 tablet (10 mg total) by mouth at bedtime.   omeprazole (PRILOSEC) 40 MG capsule Take 1 capsule (40 mg total) by mouth daily.   ondansetron (ZOFRAN-ODT) 4 MG disintegrating tablet 1 tab(s) orally 3 times a day for 30 days   promethazine-dextromethorphan (PROMETHAZINE-DM) 6.25-15 MG/5ML syrup Take 5 mLs by mouth at bedtime as needed for cough.   tamsulosin (FLOMAX) 0.4 MG CAPS capsule Take 1 capsule (0.4 mg total) by mouth daily. For prostate   Vitamin D, Ergocalciferol, (DRISDOL) 1.25 MG (50000 UNIT) CAPS capsule Take 1 capsule (50,000 Units total) by mouth every 7 (seven) days.   No facility-administered encounter medications on file as of 12/29/2021.     Review of Systems  Review of Systems  N/a Physical Exam  BP 130/82 (BP Location: Left Arm, Patient Position: Sitting, Cuff Size: Normal)   Pulse 65   Temp 98.3 F (36.8 C) (Oral)   Ht 5\' 7"  (1.702 m)   Wt 179 lb (81.2 kg)   SpO2 97%   BMI 28.04 kg/m   Wt Readings from Last 5 Encounters:  12/29/21 179 lb (81.2 kg)  08/16/21 182 lb (82.6 kg)  05/24/21 180 lb (81.6 kg)  05/18/21 180 lb 6.4 oz (81.8 kg)  02/12/21 177 lb 12.8 oz (80.6 kg)    BMI Readings from Last 5 Encounters:  12/29/21 28.04 kg/m  08/16/21 28.51 kg/m  05/24/21 27.37 kg/m  05/18/21 27.43 kg/m  02/12/21 27.03 kg/m     Physical Exam General: Well-appearing, sitting up in chair Eyes: EOMI, no icterus Neck: Supple, no JVP Pulmonary: Diffuse and expiratory wheeze, normal work of  breathing Cardiovascular: Regular rate and rhythm, no murmur Abdomen: Nondistended, bowel sounds present MSK: No synovitis, joint effusion Neuro: Normal gait, no weakness Psych: Normal mood, full affect   Assessment & Plan:   Dyspnea, cough: Most likely reflect poorly controlled asthma symptoms.  Improved with IM steroid injection early 2023.  Marked improvement with regular Trelegy use.  To continue Trelegy.  Asthma: Diagnosed some 10 years ago.  Historically poorly controlled through the winter months.  Minimal symptoms to the warmer months.  Symptoms primarily chest tightness, occasional dyspnea, nocturnal cough.  Symptoms markedly improved with Trelegy, to continue.  Continue albuterol as needed, using rarely currently.  Notably, eosinophils elevated to 600 recently, consider biologic therapy if not adequately improved with maximal inhaler therapy.  Historically, symptoms worsen in the colder months.   Return in about 6 months (around 06/29/2022).   Lanier Clam, MD 12/29/2021

## 2021-12-29 NOTE — Patient Instructions (Addendum)
Nice to see you again  Continue Trelegy 1 puff once a day - use it every day. Rinse your mouth with water after using.  Continue albuterol only as needed  Return to clinic in 6 months or sooner as needed

## 2022-02-15 ENCOUNTER — Ambulatory Visit (INDEPENDENT_AMBULATORY_CARE_PROVIDER_SITE_OTHER): Payer: Medicare Other | Admitting: Nurse Practitioner

## 2022-02-15 ENCOUNTER — Encounter (HOSPITAL_BASED_OUTPATIENT_CLINIC_OR_DEPARTMENT_OTHER): Payer: Self-pay | Admitting: Nurse Practitioner

## 2022-02-15 VITALS — BP 145/98 | HR 72 | Ht 67.0 in | Wt 181.0 lb

## 2022-02-15 DIAGNOSIS — M5432 Sciatica, left side: Secondary | ICD-10-CM

## 2022-02-15 DIAGNOSIS — E785 Hyperlipidemia, unspecified: Secondary | ICD-10-CM

## 2022-02-15 DIAGNOSIS — I152 Hypertension secondary to endocrine disorders: Secondary | ICD-10-CM

## 2022-02-15 DIAGNOSIS — R109 Unspecified abdominal pain: Secondary | ICD-10-CM | POA: Diagnosis not present

## 2022-02-15 DIAGNOSIS — B9681 Helicobacter pylori [H. pylori] as the cause of diseases classified elsewhere: Secondary | ICD-10-CM

## 2022-02-15 DIAGNOSIS — E1165 Type 2 diabetes mellitus with hyperglycemia: Secondary | ICD-10-CM

## 2022-02-15 DIAGNOSIS — K279 Peptic ulcer, site unspecified, unspecified as acute or chronic, without hemorrhage or perforation: Secondary | ICD-10-CM

## 2022-02-15 DIAGNOSIS — E039 Hypothyroidism, unspecified: Secondary | ICD-10-CM | POA: Diagnosis not present

## 2022-02-15 DIAGNOSIS — Z794 Long term (current) use of insulin: Secondary | ICD-10-CM

## 2022-02-15 DIAGNOSIS — I700 Atherosclerosis of aorta: Secondary | ICD-10-CM | POA: Diagnosis not present

## 2022-02-15 DIAGNOSIS — R3912 Poor urinary stream: Secondary | ICD-10-CM

## 2022-02-15 DIAGNOSIS — N401 Enlarged prostate with lower urinary tract symptoms: Secondary | ICD-10-CM

## 2022-02-15 DIAGNOSIS — Z23 Encounter for immunization: Secondary | ICD-10-CM | POA: Diagnosis not present

## 2022-02-15 DIAGNOSIS — E1169 Type 2 diabetes mellitus with other specified complication: Secondary | ICD-10-CM

## 2022-02-15 DIAGNOSIS — J4541 Moderate persistent asthma with (acute) exacerbation: Secondary | ICD-10-CM

## 2022-02-15 DIAGNOSIS — Z6826 Body mass index (BMI) 26.0-26.9, adult: Secondary | ICD-10-CM

## 2022-02-15 MED ORDER — LEVOTHYROXINE SODIUM 25 MCG PO TABS: 25.0000 ug | ORAL_TABLET | Freq: Every day | ORAL | 3 refills | Status: DC

## 2022-02-15 MED ORDER — PREDNISONE 10 MG (48) PO TBPK: ORAL_TABLET | Freq: Every day | ORAL | 1 refills | Status: DC

## 2022-02-15 MED ORDER — METFORMIN HCL 500 MG PO TABS: ORAL_TABLET | ORAL | 3 refills | Status: DC

## 2022-02-15 MED ORDER — GABAPENTIN 300 MG PO CAPS: 300.0000 mg | ORAL_CAPSULE | Freq: Every day | ORAL | 3 refills | Status: DC

## 2022-02-15 MED ORDER — TAMSULOSIN HCL 0.4 MG PO CAPS: 0.4000 mg | ORAL_CAPSULE | Freq: Every day | ORAL | 3 refills | Status: DC

## 2022-02-15 MED ORDER — AMOXICILLIN 500 MG PO CAPS: 500.0000 mg | ORAL_CAPSULE | Freq: Two times a day (BID) | ORAL | 0 refills | Status: AC

## 2022-02-15 MED ORDER — MONTELUKAST SODIUM 10 MG PO TABS: 10.0000 mg | ORAL_TABLET | Freq: Every day | ORAL | 3 refills | Status: DC

## 2022-02-15 MED ORDER — ATORVASTATIN CALCIUM 10 MG PO TABS: 20.0000 mg | ORAL_TABLET | Freq: Every day | ORAL | 3 refills | Status: DC

## 2022-02-15 MED ORDER — CLARITHROMYCIN 500 MG PO TABS: 500.0000 mg | ORAL_TABLET | Freq: Two times a day (BID) | ORAL | 0 refills | Status: DC

## 2022-02-15 NOTE — Patient Instructions (Addendum)
I have sent in the treatment for the H. Pylori again to see if we can get your symptoms to go away with a second round of treatment. I want you to take Pepto Bismol 1 dose on the package twice a day for 14 days with antibiotics.   I have sent in a steroid taper for your breathing. If this does not seem to be helping, please set up the appointment with the pulmonologist so we can make sure your breathing is not getting worse.   Keep an eye on the feeling in your toes and make sure it is not getting worse. If it seems to be getting worse or changing, please let me know.

## 2022-02-15 NOTE — Progress Notes (Signed)
[name], DNP, AGNP-c [facility] [address] [place], [place] [address] [phone] Office [phone] Fax  ESTABLISHED PATIENT- Chronic Health and/or Follow-Up Visit  Blood pressure (!) 145/98, pulse 72, height 5\' 7"  (1.702 m), weight 181 lb (82.1 kg), SpO2 98 %.    Paul Edwards is a 66 y.o. year old male presenting today for evaluation and management of the following: Follow-up (Pt presents for follow up. He feels he is doing great and keeping up with blood sugar. Pt would like flu shot today )  No increased hunger, thirst, urination, wounds, paresthesias, or recent illnesses. Tolerating medication well with no side effects. Monitoring blood sugar very closely and keeping track of daily readings. Watching his diet.   All ROS negative with exception of what is listed above.   PHYSICAL EXAM Physical Exam Vitals and nursing note reviewed.  Constitutional:      Appearance: Normal appearance.  HENT:     Head: Normocephalic.  Eyes:     Extraocular Movements: Extraocular movements intact.     Pupils: Pupils are equal, round, and reactive to light.  Neck:     Vascular: No carotid bruit.  Cardiovascular:     Rate and Rhythm: Normal rate and regular rhythm.     Pulses: Normal pulses.     Heart sounds: Normal heart sounds.  Pulmonary:     Effort: Pulmonary effort is normal.     Breath sounds: Normal breath sounds.  Abdominal:     General: Bowel sounds are normal. There is no distension.     Palpations: Abdomen is soft.     Tenderness: There is no abdominal tenderness. There is no guarding.  Musculoskeletal:        General: Normal range of motion.     Cervical back: Normal range of motion.     Right lower leg: No edema.     Left lower leg: No edema.  Lymphadenopathy:     Cervical: No cervical adenopathy.  Skin:    General: Skin is warm and dry.     Capillary Refill: Capillary refill takes less than 2 seconds.   Neurological:     General: No focal deficit present.     Mental Status: He is alert and oriented to person, place, and time.  Psychiatric:        Mood and Affect: Mood normal.        Behavior: Behavior normal.        Thought Content: Thought content normal.        Judgment: Judgment normal.     PLAN Problem List Items Addressed This Visit     High blood pressure - Primary    Chronic. Not well controlled in office today. At home readings show good control, suspect a component of white coat htn present. Recommend continued home monitoring. Labs pending.       Relevant Medications   atorvastatin (LIPITOR) 10 MG tablet   Other Relevant Orders   CBC with Differential/Platelet (Completed)   Comprehensive metabolic panel (Completed)   Hemoglobin A1c (Completed)   TSH (Completed)   VITAMIN D 25 Hydroxy (Vit-D Deficiency, Fractures) (Completed)   LP+LDL Direct (Completed)   Benign prostatic hyperplasia with weak urinary stream    Chronic.  Currently well-controlled on tamsulosin.  Refill provided today.      Relevant Medications   tamsulosin (FLOMAX) 0.4 MG CAPS capsule   Aortic atherosclerosis (HCC)    Chronic.  Currently on therapeutic measures for hypertension,  hyperlipidemia, and diabetes.  Labs pending at this time.  Recommendations for diet and exercise provided.  Will follow.      Relevant Medications   atorvastatin (LIPITOR) 10 MG tablet   Other Relevant Orders   CBC with Differential/Platelet (Completed)   Comprehensive metabolic panel (Completed)   Hemoglobin A1c (Completed)   TSH (Completed)   VITAMIN D 25 Hydroxy (Vit-D Deficiency, Fractures) (Completed)   LP+LDL Direct (Completed)   Hypothyroidism    Chronic. Labs pending. Tolerating medication well. No changes at this time.       Relevant Medications   levothyroxine (SYNTHROID) 25 MCG tablet   Other Relevant Orders   CBC with Differential/Platelet (Completed)   Comprehensive metabolic panel (Completed)    Hemoglobin A1c (Completed)   TSH (Completed)   VITAMIN D 25 Hydroxy (Vit-D Deficiency, Fractures) (Completed)   LP+LDL Direct (Completed)   Type 2 diabetes mellitus with hyperglycemia, with long-term current use of insulin (HCC)    Chronic. Significant improvement in at home BG readings. I am so proud of the effort he is making at this time for his overall health. Patient praised today. Continue with low carb diet and close monitoring of BG levels. Refills provided. Labs pending.       Relevant Medications   atorvastatin (LIPITOR) 10 MG tablet   metFORMIN (GLUCOPHAGE) 500 MG tablet   Other Relevant Orders   CBC with Differential/Platelet (Completed)   Comprehensive metabolic panel (Completed)   Hemoglobin A1c (Completed)   TSH (Completed)   VITAMIN D 25 Hydroxy (Vit-D Deficiency, Fractures) (Completed)   LP+LDL Direct (Completed)   Hyperlipidemia associated with type 2 diabetes mellitus (HCC)    Chronic. On statin. Labs pending. No changes to plan of care.       Relevant Medications   atorvastatin (LIPITOR) 10 MG tablet   metFORMIN (GLUCOPHAGE) 500 MG tablet   Other Relevant Orders   CBC with Differential/Platelet (Completed)   Comprehensive metabolic panel (Completed)   Hemoglobin A1c (Completed)   TSH (Completed)   VITAMIN D 25 Hydroxy (Vit-D Deficiency, Fractures) (Completed)   LP+LDL Direct (Completed)   Moderate persistent asthma with acute exacerbation    Chronic. No alarm sx at this time. Refills provided.       Relevant Medications   montelukast (SINGULAIR) 10 MG tablet   predniSONE (STERAPRED UNI-PAK 48 TAB) 10 MG (48) TBPK tablet   Body mass index 26.0-26.9, adult   Relevant Orders   CBC with Differential/Platelet (Completed)   Comprehensive metabolic panel (Completed)   Hemoglobin A1c (Completed)   TSH (Completed)   VITAMIN D 25 Hydroxy (Vit-D Deficiency, Fractures) (Completed)   LP+LDL Direct (Completed)   Other Visit Diagnoses     Sciatica of left side        Relevant Medications   gabapentin (NEURONTIN) 300 MG capsule   Abdominal pain, unspecified abdominal location       H pylori ulcer       Relevant Medications   clarithromycin (BIAXIN) 500 MG tablet   Flu vaccine need       Relevant Orders   Flu vaccine, recombinat, quadrivalent, inj (Completed)       Return in about 6 months (around 08/16/2022) for DM, HTN, HLD, GI.   Worthy Keeler, DNP, AGNP-c 02/15/2022  4:01 PM

## 2022-02-16 LAB — CBC WITH DIFFERENTIAL/PLATELET
Basophils Absolute: 0.1 10*3/uL (ref 0.0–0.2)
Basos: 1 %
EOS (ABSOLUTE): 0.5 10*3/uL — ABNORMAL HIGH (ref 0.0–0.4)
Eos: 6 %
Hematocrit: 43.7 % (ref 37.5–51.0)
Hemoglobin: 14 g/dL (ref 13.0–17.7)
Immature Grans (Abs): 0.1 10*3/uL (ref 0.0–0.1)
Immature Granulocytes: 2 %
Lymphocytes Absolute: 2 10*3/uL (ref 0.7–3.1)
Lymphs: 22 %
MCH: 25.9 pg — ABNORMAL LOW (ref 26.6–33.0)
MCHC: 32 g/dL (ref 31.5–35.7)
MCV: 81 fL (ref 79–97)
Monocytes Absolute: 0.9 10*3/uL (ref 0.1–0.9)
Monocytes: 10 %
Neutrophils Absolute: 5.4 10*3/uL (ref 1.4–7.0)
Neutrophils: 59 %
Platelets: 237 10*3/uL (ref 150–450)
RBC: 5.41 x10E6/uL (ref 4.14–5.80)
RDW: 12.9 % (ref 11.6–15.4)
WBC: 9.1 10*3/uL (ref 3.4–10.8)

## 2022-02-16 LAB — COMPREHENSIVE METABOLIC PANEL
ALT: 104 IU/L — ABNORMAL HIGH (ref 0–44)
AST: 80 IU/L — ABNORMAL HIGH (ref 0–40)
Albumin/Globulin Ratio: 1.5 (ref 1.2–2.2)
Albumin: 4.6 g/dL (ref 3.9–4.9)
Alkaline Phosphatase: 120 IU/L (ref 44–121)
BUN/Creatinine Ratio: 13 (ref 10–24)
BUN: 15 mg/dL (ref 8–27)
Bilirubin Total: 0.3 mg/dL (ref 0.0–1.2)
CO2: 24 mmol/L (ref 20–29)
Calcium: 9.6 mg/dL (ref 8.6–10.2)
Chloride: 102 mmol/L (ref 96–106)
Creatinine, Ser: 1.13 mg/dL (ref 0.76–1.27)
Globulin, Total: 3.1 g/dL (ref 1.5–4.5)
Glucose: 126 mg/dL — ABNORMAL HIGH (ref 70–99)
Potassium: 4.4 mmol/L (ref 3.5–5.2)
Sodium: 142 mmol/L (ref 134–144)
Total Protein: 7.7 g/dL (ref 6.0–8.5)
eGFR: 71 mL/min/{1.73_m2} (ref >59)

## 2022-02-16 LAB — VITAMIN D 25 HYDROXY (VIT D DEFICIENCY, FRACTURES): Vit D, 25-Hydroxy: 16.8 ng/mL — ABNORMAL LOW (ref 30.0–100.0)

## 2022-02-16 LAB — LP+LDL DIRECT
Cholesterol, Total: 204 mg/dL — ABNORMAL HIGH (ref 100–199)
HDL: 41 mg/dL (ref >39)
LDL Chol Calc (NIH): 78 mg/dL (ref 0–99)
LDL Direct: 109 mg/dL — ABNORMAL HIGH (ref 0–99)
Triglycerides: 536 mg/dL — ABNORMAL HIGH (ref 0–149)
VLDL Cholesterol Cal: 85 mg/dL — ABNORMAL HIGH (ref 5–40)

## 2022-02-16 LAB — HEMOGLOBIN A1C
Est. average glucose Bld gHb Est-mCnc: 154 mg/dL
Hgb A1c MFr Bld: 7 % — ABNORMAL HIGH (ref 4.8–5.6)

## 2022-02-16 LAB — TSH: TSH: 5.54 u[IU]/mL — ABNORMAL HIGH (ref 0.450–4.500)

## 2022-02-23 ENCOUNTER — Encounter (HOSPITAL_BASED_OUTPATIENT_CLINIC_OR_DEPARTMENT_OTHER): Payer: Self-pay

## 2022-04-07 ENCOUNTER — Encounter (HOSPITAL_BASED_OUTPATIENT_CLINIC_OR_DEPARTMENT_OTHER): Payer: Self-pay | Admitting: Nurse Practitioner

## 2022-04-07 NOTE — Assessment & Plan Note (Signed)
Chronic. Significant improvement in at home BG readings. I am so proud of the effort he is making at this time for his overall health. Patient praised today. Continue with low carb diet and close monitoring of BG levels. Refills provided. Labs pending.

## 2022-04-07 NOTE — Assessment & Plan Note (Signed)
Chronic. On statin. Labs pending. No changes to plan of care.

## 2022-04-07 NOTE — Assessment & Plan Note (Signed)
Chronic.  Currently well-controlled on tamsulosin.  Refill provided today.

## 2022-04-07 NOTE — Assessment & Plan Note (Signed)
Chronic.  Currently on therapeutic measures for hypertension, hyperlipidemia, and diabetes.  Labs pending at this time.  Recommendations for diet and exercise provided.  Will follow.

## 2022-04-07 NOTE — Assessment & Plan Note (Signed)
Chronic. No alarm sx at this time. Refills provided.

## 2022-04-07 NOTE — Assessment & Plan Note (Signed)
Chronic. Labs pending. Tolerating medication well. No changes at this time.

## 2022-04-07 NOTE — Assessment & Plan Note (Signed)
Chronic. Not well controlled in office today. At home readings show good control, suspect a component of white coat htn present. Recommend continued home monitoring. Labs pending.

## 2022-05-27 ENCOUNTER — Other Ambulatory Visit: Payer: Self-pay | Admitting: Pulmonary Disease

## 2022-05-27 DIAGNOSIS — J4540 Moderate persistent asthma, uncomplicated: Secondary | ICD-10-CM

## 2022-07-28 ENCOUNTER — Other Ambulatory Visit: Payer: Self-pay

## 2022-07-28 DIAGNOSIS — Z789 Other specified health status: Secondary | ICD-10-CM

## 2022-08-09 ENCOUNTER — Telehealth: Payer: Self-pay

## 2022-08-10 NOTE — Progress Notes (Unsigned)
Care Management & Coordination Services Pharmacy Note  08/10/2022 Name:  Cecilia Nishikawa MRN:  [id number] DOB:  24-Oct-1955  Summary: ***  Recommendations/Changes made from today's visit: ***  Follow up plan: ***   Subjective: Paul Edwards is an 67 y.o. year old male who is a primary patient of Early, Sung Amabile, NP.  The care coordination team was consulted for assistance with disease management and care coordination needs.    {CCMTELEPHONEFACETOFACE:21091510} for initial visit.  Recent office visits: 02/15/22 Early, Sung Amabile, NP - Patient presented to [facility] for hypertension due to endocrine disorder and other concerns. Prescribed Amoxicillin. Prescribed Clarithromycin. Prescribed Prednisone. Changed Metformin. Stopped Benzonatate. Stopped Tessalo   Recent consult visits: None  Hospital visits: None in previous 6 months   Objective:  Lab Results  Component Value Date   CREATININE 1.13 02/15/2022   BUN 15 02/15/2022   EGFR 71 02/15/2022   GFRNONAA >60 07/31/2020   GFRAA >60 01/27/2019   NA 142 02/15/2022   K 4.4 02/15/2022   CALCIUM 9.6 02/15/2022   CO2 24 02/15/2022   GLUCOSE 126 (H) 02/15/2022    Lab Results  Component Value Date/Time   HGBA1C 7.0 (H) 02/15/2022 04:44 PM   HGBA1C 7.1 (H) 08/16/2021 03:28 PM   MICROALBUR 30 02/12/2021 12:02 PM    Last diabetic Eye exam: No results found for: "HMDIABEYEEXA"  Last diabetic Foot exam: No results found for: "HMDIABFOOTEX"   Lab Results  Component Value Date   CHOL 204 (H) 02/15/2022   HDL 41 02/15/2022   LDLCALC 78 02/15/2022   LDLDIRECT 109 (H) 02/15/2022   TRIG 536 (H) 02/15/2022   CHOLHDL 3.5 02/12/2021       Latest Ref Rng & Units 02/15/2022    4:44 PM 08/16/2021    3:28 PM 05/19/2021    8:13 AM  Hepatic Function  Total Protein 6.0 - 8.5 g/dL 7.7  6.9  7.4   Albumin 3.9 - 4.9 g/dL 4.6  4.3  4.4   AST 0 - 40 IU/L 80  41  104   ALT 0 - 44 IU/L 104  63  110   Alk Phosphatase 44 - 121 IU/L 120   132  107   Total Bilirubin 0.0 - 1.2 mg/dL 0.3  0.4  0.4     Lab Results  Component Value Date/Time   TSH 5.540 (H) 02/15/2022 04:44 PM   TSH 2.790 08/16/2021 03:28 PM   FREET4 1.17 08/16/2021 03:28 PM   FREET4 1.02 10/10/2016 05:37 PM       Latest Ref Rng & Units 02/15/2022    4:44 PM 08/16/2021    3:28 PM 05/19/2021    8:13 AM  CBC  WBC 3.4 - 10.8 x10E3/uL 9.1  8.8  7.3   Hemoglobin 13.0 - 17.7 g/dL 78.2  95.6  21.3   Hematocrit 37.5 - 51.0 % 43.7  44.5  46.3   Platelets 150 - 450 x10E3/uL 237  190  203     Lab Results  Component Value Date/Time   VD25OH 16.8 (L) 02/15/2022 04:44 PM   VD25OH 17.8 (L) 02/12/2021 09:13 AM    Clinical ASCVD: Yes  The 10-year ASCVD risk score (Arnett DK, et al., 2019) is: 39.9%   Values used to calculate the score:     Age: 87 years     Sex: Male     Is Non-Hispanic African American: No     Diabetic: Yes     Tobacco smoker: No  Systolic Blood Pressure: 145 mmHg     Is BP treated: Yes     HDL Cholesterol: 41 mg/dL     Total Cholesterol: 204 mg/dL       04/16/1094    0:45 PM 07/31/2020   10:54 AM 02/22/2018    3:40 PM  Depression screen PHQ 2/9  Decreased Interest 0 0 0  Down, Depressed, Hopeless 0 0 0  PHQ - 2 Score 0 0 0  Altered sleeping  0   Tired, decreased energy  0   Change in appetite  0   Feeling bad or failure about yourself   0   Trouble concentrating  0   Moving slowly or fidgety/restless  0   Suicidal thoughts  0   PHQ-9 Score  0      Social History   Tobacco Use  Smoking Status Former   Years: 15   Types: Cigarettes  Smokeless Tobacco Never   BP Readings from Last 3 Encounters:  02/15/22 (!) 145/98  12/29/21 130/82  08/16/21 122/82   Pulse Readings from Last 3 Encounters:  02/15/22 72  12/29/21 65  08/16/21 72   Wt Readings from Last 3 Encounters:  02/15/22 181 lb (82.1 kg)  12/29/21 179 lb (81.2 kg)  08/16/21 182 lb (82.6 kg)   BMI Readings from Last 3 Encounters:  02/15/22 28.35 kg/m   12/29/21 28.04 kg/m  08/16/21 28.51 kg/m    No Known Allergies  Medications Reviewed Today     Reviewed by Tollie Eth, NP (Nurse Practitioner) on 02/15/22 at 1638  Med List Status: <None>   Medication Order Taking? Sig Documenting Provider Last Dose Status Informant  albuterol (PROVENTIL) (2.5 MG/3ML) 0.083% nebulizer solution [id number] Yes Take 3 mLs (2.5 mg total) by nebulization every 4 (four) hours as needed for up to 60 doses for wheezing or shortness of breath. Tollie Eth, NP Taking Active   albuterol (VENTOLIN HFA) 108 (90 Base) MCG/ACT inhaler [id number] Yes Inhale 2 puffs into the lungs every 4 (four) hours as needed for wheezing. Tollie Eth, NP Taking Active   AMBULATORY NON FORMULARY MEDICATION [id number] Yes Compression stockings 15-35mmHg. To be worn for 12 hours then removed for 12 hours daily. For edema, diabetes Early, Sung Amabile, NP Taking Active   amLODipine (NORVASC) 5 MG tablet [id number] Yes Take 1 tablet (5 mg total) by mouth daily. Tollie Eth, NP Taking Active   amoxicillin (AMOXIL) 500 MG capsule [id number] Yes Take 1 capsule (500 mg total) by mouth 2 (two) times daily for 14 days. Tollie Eth, NP  Active   atorvastatin (LIPITOR) 10 MG tablet [id number]  Take 2 tablets (20 mg total) by mouth daily. For diabetes and high cholesterol. Tollie Eth, NP  Active   clarithromycin (BIAXIN) 500 MG tablet [id number] Yes Take 1 tablet (500 mg total) by mouth 2 (two) times daily. Tollie Eth, NP  Active   Fluticasone-Umeclidin-Vilant (TRELEGY ELLIPTA) 200-62.5-25 MCG/ACT AEPB [id number] Yes Inhale 1 puff into the lungs daily. Hunsucker, Lesia Sago, MD Taking Active   gabapentin (NEURONTIN) 300 MG capsule [id number]  Take 1 capsule (300 mg total) by mouth at bedtime. For leg pain Early, Sung Amabile, NP  Active   glucose blood test strip [id number] Yes Use up to 4 times per day as directed for Contour Next EZ glucometer. Disp: 100. Refill x99 Tollie Eth, NP Taking Active    levothyroxine (SYNTHROID) 25 MCG tablet [id number]  Take 1 tablet (25 mcg total)  by mouth daily before breakfast. Early, Sung Amabile, NP  Active   metFORMIN (GLUCOPHAGE) 500 MG tablet [id number]  Take 1 tablet (500 mg total) by mouth daily with breakfast AND 2 tablets (1,000 mg total) daily with supper. For diabetes.Tollie Eth, NP  Active   montelukast (SINGULAIR) 10 MG tablet [id number]  Take 1 tablet (10 mg total) by mouth at bedtime. Tollie Eth, NP  Active   omeprazole (PRILOSEC) 40 MG capsule [id number] Yes Take 1 capsule (40 mg total) by mouth daily. Tollie Eth, NP Taking Active   ondansetron (ZOFRAN-ODT) 4 MG disintegrating tablet [id number] Yes 1 tab(s) orally 3 times a day for 30 days [provider] Taking Active   tamsulosin (FLOMAX) 0.4 MG CAPS capsule [id number]  Take 1 capsule (0.4 mg total) by mouth daily. For prostate Early, Sung Amabile, NP  Active   Vitamin D, Ergocalciferol, (DRISDOL) 1.25 MG (50000 UNIT) CAPS capsule [id number] Yes Take 1 capsule (50,000 Units total) by mouth every 7 (seven) days. Early, Sung Amabile, NP Taking Active             SDOH:  (Social Determinants of Health) assessments and interventions performed: Yes   Medication Assistance: {MEDASSISTANCEINFO:25044}  Medication Access: Within the past 30 days, how often has patient missed a dose of medication? *** Is a pillbox or other method used to improve adherence? {YES/NO:21197} Factors that may affect medication adherence? {CHL DESC; BARRIERS:21522} Are meds synced by current pharmacy? {YES/NO:21197} Are meds delivered by current pharmacy? {YES/NO:21197} Does patient experience delays in picking up medications due to transportation concerns? {YES/NO:21197}  [facility] Reviewed: Is patient disadvantaged to use [facility]?: No  Current Rx insurance plan: [facility] Name and location of Current pharmacy:  [id number] - [place], [place] - [address] AT [facility] OF [address] [place] [address] Phone: [phone] Fax: [phone]  [facility] services reviewed with patient today?: {YES/NO:21197} Patient requests to transfer care to [facility]?: {YES/NO:21197} Reason patient declined to change pharmacies: {US patient preference:27474}  Compliance/Adherence/Medication fill history: Care Gaps: AWV - Overdue COVID Booster - Overdue Eye Exam - Overdue Colonoscopy - Overdue Zoster Vaccine - Overdue Diabetic Urine - Overdue    Star-Rating Drugs: Atorvastatin 10 mg - Last filled 05/18/21 15 DS at [facility] Verified and prescription is expired Metformin 500 mg - Last filled 05/23/22 90 DS at [facility]   Assessment/Plan   Hypertension (BP goal <140/90) -{US controlled/uncontrolled:25276} -Current treatment: Amlodipine 5mg  1 qd -Medications previously tried: ***  -Current home readings: *** -Current dietary habits: *** -Current exercise habits: *** -{ACTIONS;DENIES/REPORTS:21021675::"Denies"} hypotensive/hypertensive symptoms -Educated on {CCM BP Counseling:25124} -Counseled to monitor BP at home ***, document, and provide log at future appointments -{CCMPHARMDINTERVENTION:25122}  Hyperlipidemia: (LDL goal < 70) -{US controlled/uncontrolled:25276} -Current treatment: Atorvastatin 20mg  1 qd -Medications previously tried: None  -Current dietary patterns: *** -Current exercise habits: *** -Educated on {CCM HLD Counseling:25126} -{CCMPHARMDINTERVENTION:25122}  Diabetes (A1c goal <7%) -Controlled -Current medications: Metformin 500mg  1 with breakfast, 2 with supper -Medications previously tried: None  -Current home glucose readings fasting glucose: *** post prandial glucose: *** -{ACTIONS;DENIES/REPORTS:21021675::"Denies"} hypoglycemic/hyperglycemic symptoms -Current meal patterns:  breakfast: ***  lunch: ***  dinner: *** snacks: *** drinks: *** -Current exercise: *** -Educated on {CCM DM  COUNSELING:25123} -Counseled to check feet daily and get yearly eye exams -{CCMPHARMDINTERVENTION:25122}  Asthma (Goal: control symptoms and prevent exacerbations) -{US controlled/uncontrolled:25276} -Current treatment  Trelegy 200 1 puff daily Albuterol HFA 2  puffs q 4 h prn wheezing Albuterol 0.083% neb prn Montelukast 10mg  1 qd -Medications previously tried: Advair -Patient {Actions; denies-reports:120008} consistent use of maintenance inhaler -Frequency of rescue inhaler use: *** -Counseled on {CCMINHALERCOUNSELING:25121} -{CCMPHARMDINTERVENTION:25122}  GERD (Goal: minimize symptoms of reflux ) -Not assessed today -Current treatment  Omeprazole 40mg  1 qd  Hypothyroidism (Goal TSH: 0.4-4.5 -{US controlled/uncontrolled:25276} -Current treatment: Levothyroxine 1 qd -Counseled to take medication *** -{CCMPHARMDINTERVENTION:25122}  BPH (Goal: improve urination) -Not assessed today -Current treatment: Flomax 0.4mg  1 qd    Sherrill Raring Clinical Pharmacist [phone]

## 2022-08-10 NOTE — Progress Notes (Signed)
Patient ID: Paul Edwards, male   DOB: Feb 16, 1956, 67 y.o.   MRN: [id number]  Care Management & Coordination Services Pharmacy Team  Reason for Encounter: Chart Prep for initial encounter on 5/2 at 10 am in office with Milas Kocher Clinical Pharmacist.   Unsuccessful outreach. Left voicemail for patient to return call.    Chart review:  Recent office visits:   02/15/22 Early, Sung Amabile, NP - Patient presented to [facility] for hypertension due to endocrine disorder and other concerns. Prescribed Amoxicillin. Prescribed Clarithromycin. Prescribed Prednisone. Changed Metformin. Stopped Benzonatate. Stopped Tessalon.   Recent consult visits:  None  Hospital visits:  None in previous 6 months   Fill History  :  AMLODIPINE BESYLATE 5MG  TABLETS 07/07/2022 90   ATORVASTATIN 10MG  TABLETS 05/18/2021 15   GABAPENTIN 300MG  CAPSULES 02/15/2022 90   TRELEGY ELLIPTA 200-62. INH 30P 08/03/2022 30   LEVOTHYROXINE 0.025MG  ( ) TAB 05/23/2022 90   METFORMIN 500MG  TABLETS 05/23/2022 90   MONTELUKAST 10MG  TABLETS 05/23/2022 90   OMEPRAZOLE 40MG  CAPSULES 07/26/2022 90   TAMSULOSIN 0.4MG  CAPSULES 05/23/2022 90    Star Rating Drugs:  Atorvastatin 10 mg - Last filled 05/18/21 15 DS at [facility] Verified and prescription is expired Metformin 500 mg - Last filled 05/23/22 90 DS at [facility]    Care Gaps: AWV - Overdue COVID Booster - Overdue Eye Exam - Overdue Colonoscopy - Overdue Zoster Vaccine - Overdue Diabetic Urine - Overdue    Pamala Duffel [facility] Clinical Pharmacist Assistant [phone]

## 2022-08-29 ENCOUNTER — Telehealth: Payer: Self-pay

## 2022-08-29 NOTE — Progress Notes (Signed)
Patient ID: Paul Edwards, male   DOB: [birth year], 67 y.o.   MRN: [id number]  Care Management & Coordination Services Pharmacy Team  Reason for Encounter: Reschedule encounter with Delano Metz Pharm D.      Call to pt's Daughter per A H to attempt to reschedule initial encounter, did not reach left her a detailed voicemail including my contact information for a return call.    Medications: Outpatient Encounter Medications as of 08/29/2022  Medication Sig   albuterol (PROVENTIL) (2.5 MG/3ML) 0.083% nebulizer solution Take 3 mLs (2.5 mg total) by nebulization every 4 (four) hours as needed for up to 60 doses for wheezing or shortness of breath.   albuterol (VENTOLIN HFA) 108 (90 Base) MCG/ACT inhaler Inhale 2 puffs into the lungs every 4 (four) hours as needed for wheezing.   AMBULATORY NON FORMULARY MEDICATION Compression stockings 15-34mmHg. To be worn for 12 hours then removed for 12 hours daily. For edema, diabetes   amLODipine (NORVASC) 5 MG tablet Take 1 tablet (5 mg total) by mouth daily.   atorvastatin (LIPITOR) 10 MG tablet Take 2 tablets (20 mg total) by mouth daily. For diabetes and high cholesterol.   clarithromycin (BIAXIN) 500 MG tablet Take 1 tablet (500 mg total) by mouth 2 (two) times daily.   Fluticasone-Umeclidin-Vilant (TRELEGY ELLIPTA) 200-62.5-25 MCG/ACT AEPB INHALE 1 PUFF INTO THE LUNGS DAILY   gabapentin (NEURONTIN) 300 MG capsule Take 1 capsule (300 mg total) by mouth at bedtime. For leg pain   glucose blood test strip Use up to 4 times per day as directed for Contour Next EZ glucometer. Disp: 100. Refill x99   levothyroxine (SYNTHROID) 25 MCG tablet Take 1 tablet (25 mcg total) by mouth daily before breakfast.   metFORMIN (GLUCOPHAGE) 500 MG tablet Take 1 tablet (500 mg total) by mouth daily with breakfast AND 2 tablets (1,000 mg total) daily with supper. For diabetes..   montelukast (SINGULAIR) 10 MG tablet Take 1 tablet (10 mg total) by mouth at bedtime.    omeprazole (PRILOSEC) 40 MG capsule Take 1 capsule (40 mg total) by mouth daily.   ondansetron (ZOFRAN-ODT) 4 MG disintegrating tablet 1 tab(s) orally 3 times a day for 30 days   predniSONE (STERAPRED UNI-PAK 48 TAB) 10 MG (48) TBPK tablet Take by mouth daily. 12-Day taper, po   tamsulosin (FLOMAX) 0.4 MG CAPS capsule Take 1 capsule (0.4 mg total) by mouth daily. For prostate   Vitamin D, Ergocalciferol, (DRISDOL) 1.25 MG (50000 UNIT) CAPS capsule Take 1 capsule (50,000 Units total) by mouth every 7 (seven) days.   No facility-administered encounter medications on file as of 08/29/2022.    Recent vitals BP Readings from Last 3 Encounters:  02/15/22 (!) 145/98  12/29/21 130/82  08/16/21 122/82   Pulse Readings from Last 3 Encounters:  02/15/22 72  12/29/21 65  08/16/21 72   Wt Readings from Last 3 Encounters:  02/15/22 181 lb (82.1 kg)  12/29/21 179 lb (81.2 kg)  08/16/21 182 lb (82.6 kg)   BMI Readings from Last 3 Encounters:  02/15/22 28.35 kg/m  12/29/21 28.04 kg/m  08/16/21 28.51 kg/m    Recent lab results    Component Value Date/Time   NA 142 02/15/2022 1644   K 4.4 02/15/2022 1644   CL 102 02/15/2022 1644   CO2 24 02/15/2022 1644   GLUCOSE 126 (H) 02/15/2022 1644   GLUCOSE 84 07/31/2020 1204   BUN 15 02/15/2022 1644   CREATININE 1.13 02/15/2022 1644   CREATININE 1.07 06/10/2012  1358   CALCIUM 9.6 02/15/2022 1644    Lab Results  Component Value Date   CREATININE 1.13 02/15/2022   EGFR 71 02/15/2022   GFRNONAA >60 07/31/2020   GFRAA >60 01/27/2019   Lab Results  Component Value Date/Time   HGBA1C 7.0 (H) 02/15/2022 04:44 PM   HGBA1C 7.1 (H) 08/16/2021 03:28 PM   MICROALBUR 30 02/12/2021 12:02 PM    Lab Results  Component Value Date   CHOL 204 (H) 02/15/2022   HDL 41 02/15/2022   LDLCALC 78 02/15/2022   LDLDIRECT 109 (H) 02/15/2022   TRIG 536 (H) 02/15/2022   CHOLHDL 3.5 02/12/2021    Care Gaps: AWV - Overdue COVID Booster - Overdue Eye Exam  - Overdue Colonoscopy - Overdue Zoster Vaccine - Overdue Diabetic Urine - Overdue  Star Rating Drugs:  Atorvastatin 10 mg - Last filled 05/18/21 15 DS at [facility] Verified and prescription is expired Metformin 500 mg - Last filled 05/23/22 90 DS at [facility]   Pamala Duffel CMA Clinical Pharmacist Assistant [phone]

## 2022-09-09 NOTE — Progress Notes (Cosign Needed)
Patient ID: Paul Edwards, male   DOB: 1955/12/08, 67 y.o.   MRN: [id number]    2nd attempt to reschedule made, did not reach left voicemail for return call.   Pamala Duffel CMA Clinical Pharmacist Assistant [phone]

## 2022-10-17 ENCOUNTER — Emergency Department (HOSPITAL_COMMUNITY): Payer: Medicare Other

## 2022-10-17 ENCOUNTER — Emergency Department (HOSPITAL_COMMUNITY)
Admission: EM | Admit: 2022-10-17 | Discharge: 2022-10-17 | Disposition: A | Discharge status: Home / Self Care | Payer: Medicare Other | Source: Home / Self Care | Attending: Emergency Medicine | Admitting: Emergency Medicine

## 2022-10-17 DIAGNOSIS — S81812A Laceration without foreign body, left lower leg, initial encounter: Secondary | ICD-10-CM

## 2022-10-17 DIAGNOSIS — S51812A Laceration without foreign body of left forearm, initial encounter: Secondary | ICD-10-CM

## 2022-10-17 DIAGNOSIS — Z23 Encounter for immunization: Secondary | ICD-10-CM | POA: Insufficient documentation

## 2022-10-17 DIAGNOSIS — S8992XA Unspecified injury of left lower leg, initial encounter: Secondary | ICD-10-CM | POA: Diagnosis not present

## 2022-10-17 DIAGNOSIS — G8929 Other chronic pain: Secondary | ICD-10-CM | POA: Diagnosis not present

## 2022-10-17 DIAGNOSIS — Y990 Civilian activity done for income or pay: Secondary | ICD-10-CM | POA: Insufficient documentation

## 2022-10-17 DIAGNOSIS — S59912A Unspecified injury of left forearm, initial encounter: Secondary | ICD-10-CM | POA: Diagnosis not present

## 2022-10-17 DIAGNOSIS — W268XXA Contact with other sharp object(s), not elsewhere classified, initial encounter: Secondary | ICD-10-CM | POA: Diagnosis not present

## 2022-10-17 DIAGNOSIS — R58 Hemorrhage, not elsewhere classified: Secondary | ICD-10-CM | POA: Diagnosis not present

## 2022-10-17 LAB — COMPREHENSIVE METABOLIC PANEL
ALT: 115 U/L — ABNORMAL HIGH (ref 0–44)
AST: 122 U/L — ABNORMAL HIGH (ref 15–41)
Albumin: 3.4 g/dL — ABNORMAL LOW (ref 3.5–5.0)
Alkaline Phosphatase: 72 U/L (ref 38–126)
Anion gap: 9 (ref 5–15)
BUN: 12 mg/dL (ref 8–23)
CO2: 25 mmol/L (ref 22–32)
Calcium: 8.6 mg/dL — ABNORMAL LOW (ref 8.9–10.3)
Chloride: 106 mmol/L (ref 98–111)
Creatinine, Ser: 1.11 mg/dL (ref 0.61–1.24)
GFR, Estimated: >60 mL/min (ref >60)
Glucose, Bld: 127 mg/dL — ABNORMAL HIGH (ref 70–99)
Potassium: 3.6 mmol/L (ref 3.5–5.1)
Sodium: 140 mmol/L (ref 135–145)
Total Bilirubin: 0.5 mg/dL (ref 0.3–1.2)
Total Protein: 6.4 g/dL — ABNORMAL LOW (ref 6.5–8.1)

## 2022-10-17 LAB — URINALYSIS, ROUTINE W REFLEX MICROSCOPIC
Bilirubin Urine: NEGATIVE
Glucose, UA: NEGATIVE mg/dL
Hgb urine dipstick: NEGATIVE
Ketones, ur: NEGATIVE mg/dL
Leukocytes,Ua: NEGATIVE
Nitrite: NEGATIVE
Protein, ur: NEGATIVE mg/dL
Specific Gravity, Urine: 1.009 (ref 1.005–1.030)
pH: 6 (ref 5.0–8.0)

## 2022-10-17 LAB — PROTIME-INR
INR: 1.1 (ref 0.8–1.2)
Prothrombin Time: 14.4 seconds (ref 11.4–15.2)

## 2022-10-17 LAB — CBC
HCT: 39.4 % (ref 39.0–52.0)
Hemoglobin: 12.4 g/dL — ABNORMAL LOW (ref 13.0–17.0)
MCH: 26.1 pg (ref 26.0–34.0)
MCHC: 31.5 g/dL (ref 30.0–36.0)
MCV: 82.8 fL (ref 80.0–100.0)
Platelets: 172 10*3/uL (ref 150–400)
RBC: 4.76 MIL/uL (ref 4.22–5.81)
RDW: 12.1 % (ref 11.5–15.5)
WBC: 5.8 10*3/uL (ref 4.0–10.5)
nRBC: 0 % (ref 0.0–0.2)

## 2022-10-17 LAB — I-STAT CHEM 8, ED
BUN: 12 mg/dL (ref 8–23)
Calcium, Ion: 1.12 mmol/L — ABNORMAL LOW (ref 1.15–1.40)
Chloride: 106 mmol/L (ref 98–111)
Creatinine, Ser: 1.2 mg/dL (ref 0.61–1.24)
Glucose, Bld: 124 mg/dL — ABNORMAL HIGH (ref 70–99)
HCT: 39 % (ref 39.0–52.0)
Hemoglobin: 13.3 g/dL (ref 13.0–17.0)
Potassium: 3.5 mmol/L (ref 3.5–5.1)
Sodium: 141 mmol/L (ref 135–145)
TCO2: 24 mmol/L (ref 22–32)

## 2022-10-17 LAB — HEMOGLOBIN AND HEMATOCRIT, BLOOD
HCT: 38.4 % — ABNORMAL LOW (ref 39.0–52.0)
Hemoglobin: 12.2 g/dL — ABNORMAL LOW (ref 13.0–17.0)

## 2022-10-17 LAB — SAMPLE TO BLOOD BANK

## 2022-10-17 LAB — LACTIC ACID, PLASMA: Lactic Acid, Venous: 2.7 mmol/L (ref 0.5–1.9)

## 2022-10-17 LAB — ETHANOL: Alcohol, Ethyl (B): <10 mg/dL (ref <10)

## 2022-10-17 MED ORDER — TETANUS-DIPHTH-ACELL PERTUSSIS 5-2.5-18.5 LF-MCG/0.5 IM SUSY
0.5000 mL | PREFILLED_SYRINGE | Freq: Once | INTRAMUSCULAR | Status: AC
  Administered 2022-10-17: 0.5 mL via INTRAMUSCULAR

## 2022-10-17 MED ORDER — CEFAZOLIN SODIUM-DEXTROSE 2-4 GM/100ML-% IV SOLN
2.0000 g | Freq: Once | INTRAVENOUS | Status: AC
  Administered 2022-10-17: 2 g via INTRAVENOUS

## 2022-10-17 MED ORDER — BUPIVACAINE LIPOSOME 1.3 % IJ SUSP
INTRAMUSCULAR | Status: AC
  Filled 2022-10-17: qty 20

## 2022-10-17 MED ORDER — BUPIVACAINE LIPOSOME 1.3 % IJ SUSP
20.0000 mL | Freq: Once | INTRAMUSCULAR | Status: AC
  Administered 2022-10-17: 266 mg
  Filled 2022-10-17: qty 20

## 2022-10-17 MED ORDER — BUPIVACAINE HCL (PF) 0.5 % IJ SOLN
INTRAMUSCULAR | Status: AC
  Filled 2022-10-17: qty 30

## 2022-10-17 MED ORDER — BUPIVACAINE HCL 0.5 % IJ SOLN
50.0000 mL | Freq: Once | INTRAMUSCULAR | Status: AC
  Administered 2022-10-17: 50 mL
  Filled 2022-10-17: qty 50

## 2022-10-17 MED ORDER — FENTANYL CITRATE PF 50 MCG/ML IJ SOSY
50.0000 ug | PREFILLED_SYRINGE | Freq: Once | INTRAMUSCULAR | Status: AC
  Administered 2022-10-17: 50 ug via INTRAVENOUS

## 2022-10-17 MED ORDER — SODIUM CHLORIDE 0.9 % IV BOLUS
1000.0000 mL | Freq: Once | INTRAVENOUS | Status: AC
  Administered 2022-10-17: 1000 mL via INTRAVENOUS

## 2022-10-17 NOTE — Procedures (Signed)
Laceration reapair Procedure Note  Pre-operative Diagnosis: left forearm laceration, left lower extremity laceration  Post-operative Diagnosis: same  Indications: laceration   Anesthesia: EXPAREL (Bupivacaine) local anesthetic  Procedure Details  The procedure, risks and complications have been discussed in detail (including, but not limited to infection, bleeding) with the patient, and the patient has signed consent to the procedure.  Left upper extremity: The skin was sterilely prepped and draped over the affected area in the usual fashion. After adequate local anesthesia, the 8 cm laceration was closed using 3.0 Ethibond sutures. Simple interrupted sutures.   Left lower extremity:  The skin was sterilely prepped and draped over the affected area in the usual fashion. After adequate local anesthesia, the deep subcuticular layer of the laceration was re-approximated using running 3.0 vicryl sutures . Skin closed using running 3.0 ethibond sutures x2.   The patient was observed until stable.  Findings: None      Condition: Tolerated procedure well   Complications: none.  Follow up in 10-14 days for suture removal.   Paul Edwards, [facility] 10/17/2022, 4:21 PM Please see Amion for pager number during day hours 7:00am-4:30pm

## 2022-10-17 NOTE — Consult Note (Addendum)
Hospital Consult    Reason for Consult:  LUE, LLE lacerations Requesting Physician:  Dr. Rush Landmark MRN #:  [id number]  History of Present Illness: This is a 67 y.o. male with unknown past medical history who vascular was consulted for evaluation of left wrist and left leg lacerations. These lacerations were accidentally self inflicted this morning prior to arrival via EMS with some type of rotating saw. Patient not entirely sure of mechanism behind what happened  No past medical history on file.   Not on File  Prior to Admission medications   Not on File    Social History   Socioeconomic History   Marital status: Single    Spouse name: Not on file   Number of children: Not on file   Years of education: Not on file   Highest education level: Not on file  Occupational History   Not on file  Tobacco Use   Smoking status: Not on file   Smokeless tobacco: Not on file  Substance and Sexual Activity   Alcohol use: Not on file   Drug use: Not on file   Sexual activity: Not on file  Other Topics Concern   Not on file  Social History Narrative   Not on file   Social Determinants of Health   Financial Resource Strain: Not on file  Food Insecurity: Not on file  Transportation Needs: Not on file  Physical Activity: Not on file  Stress: Not on file  Social Connections: Not on file  Intimate Partner Violence: Not on file     No family history on file.  ROS: Otherwise negative unless mentioned in HPI  Physical Examination  Vitals:   10/17/22 1156 10/17/22 1215  BP:    Pulse: 69   Resp: (!) 26   Temp:    SpO2:  99%   Body mass index is 26.61 kg/m.  General:  WDWN in NAD; in discomfort  but no acute distress Gait: Not observed HENT: WNL, normocephalic Pulmonary: normal non-labored breathing Cardiac: regular Vascular Exam/Pulses: 2+ DP pulse in left leg, palpable left radial pulse Extremities: No pulsatile bleeding from approximately 10 cm anterior medial left  leg laceration. Superficial venous and arterial bleeding at level of dermis appreciable Musculoskeletal: no muscle wasting or atrophy  Neurologic: A&O X 3;  No focal weakness or paresthesias are detected; speech is fluent/normal Psychiatric:  The pt has Normal affect.   CBC    Component Value Date/Time   HGB 13.3 10/17/2022 1219   HCT 39.0 10/17/2022 1219    BMET    Component Value Date/Time   NA 141 10/17/2022 1219   K 3.5 10/17/2022 1219   CL 106 10/17/2022 1219   GLUCOSE 124 (H) 10/17/2022 1219   BUN 12 10/17/2022 1219   CREATININE 1.20 10/17/2022 1219    COAGS: No results found for: "INR", "PROTIME"   Non-Invasive Vascular Imaging:   None  Statin:  No. Beta Blocker:  No. Aspirin:  No. ACEI:  No. ARB:  No. CCB use:  No Other antiplatelets/anticoagulants:  No.    ASSESSMENT/PLAN: This is a 67 y.o. male who presented via EMS with left upper extremity and left lower extremity lacerations accidentally self inflicted with some type of saw. Patient seen in examination with Dr. Karin Lieu. No significant vascular compromise to left upper or lower extremities with easily palpable pulses. Left wrist laceration very superficial. Left anterior medial leg laceration with no pulsatile bleeding. Some superficial venous and arterial bleeding appreciable at level of dermis.  No role for any vascular surgery intervention. Recommend consulting trauma service for washout and laceration repair.   Graceann Congress PA-C [facility] [phone] 10/17/2022  12:23 PM  VASCULAR STAFF ADDENDUM: I have independently interviewed and examined the patient. I agree with the above.  Wound bed palpated. Superficial to all major vascular structures.  Palpable pulse in the foot. Will sign off.   Fara Olden, MD [facility] Phone Number: [phone] 10/17/2022 1:46 PM

## 2022-10-17 NOTE — ED Provider Notes (Signed)
[facility] Provider Note   CSN: [id number] Arrival date & time: 10/17/22  1153     History  Chief Complaint  Patient presents with   Level 2    [name] is a 67 y.o. male.  The history is provided by the patient and medical records. No language interpreter was used.  Trauma Mechanism of injury: Cutting tool injury Injury location: leg and shoulder/arm Injury location detail: L forearm and L lower leg Incident location: at work Arrived directly from scene: yes       Suspicion of alcohol use: no      Suspicion of drug use: no  EMS/PTA data:      Blood loss: large (per EMS report)      Loss of consciousness: no  Current symptoms:      Associated symptoms:            Denies back pain, chest pain and loss of consciousness.       Home Medications Prior to Admission medications   Not on File      Allergies    Patient has no allergy information on record.    Review of Systems   Review of Systems  Constitutional:  Positive for fatigue. Negative for chills and fever.  Respiratory:  Negative for cough, chest tightness, shortness of breath and wheezing.   Cardiovascular:  Negative for chest pain.  Genitourinary:  Negative for flank pain.  Musculoskeletal:  Negative for back pain.  Skin:  Positive for wound.  Neurological:  Positive for light-headedness. Negative for dizziness, loss of consciousness and weakness.  Psychiatric/Behavioral:  Negative for agitation.   All other systems reviewed and are negative.   Physical Exam Updated Vital Signs BP (!) 146/84 (BP Location: Left Arm)   Pulse 69   Temp 98.8 F (37.1 C) (Oral)   Resp (!) 26   Wt 79.4 kg  Physical Exam Vitals and nursing note reviewed.  Constitutional:      General: He is in acute distress.     Appearance: He is well-developed. He is ill-appearing.  HENT:     Head: Normocephalic and atraumatic.     Nose: Nose normal.     Mouth/Throat:     Mouth:  Mucous membranes are dry.  Eyes:     Conjunctiva/sclera: Conjunctivae normal.     Pupils: Pupils are equal, round, and reactive to light.  Cardiovascular:     Rate and Rhythm: Normal rate and regular rhythm.     Heart sounds: No murmur heard. Pulmonary:     Effort: Pulmonary effort is normal. No respiratory distress.     Breath sounds: Normal breath sounds. No wheezing, rhonchi or rales.  Chest:     Chest wall: No tenderness.  Abdominal:     Palpations: Abdomen is soft.     Tenderness: There is no abdominal tenderness. There is no guarding.  Musculoskeletal:        General: Tenderness present. No swelling.     Left forearm: Laceration present.     Cervical back: Neck supple.     Left lower leg: Laceration present.     Comments: 5 cm laceration to left forearm.  Mild venous oozing.  Intact sensation, strength, and pulse distally.  Large laceration to left shin.  Both high-pressure venous and pulsatile bleeding out of the shin.  Intact sensation and strength distally.  Had palpable DP and dopplerable PT pulse.  Deep laceration quickly had pressure applied.  Skin:  General: Skin is warm and dry.     Capillary Refill: Capillary refill takes less than 2 seconds.     Coloration: Skin is pale.     Findings: No erythema.  Neurological:     General: No focal deficit present.     Mental Status: He is alert.     Sensory: No sensory deficit.     Motor: No weakness.  Psychiatric:        Mood and Affect: Mood normal.     ED Results / Procedures / Treatments   Labs (all labs ordered are listed, but only abnormal results are displayed) Labs Reviewed  COMPREHENSIVE METABOLIC PANEL - Abnormal; Notable for the following components:      Result Value   Glucose, Bld 127 (*)    Calcium 8.6 (*)    Total Protein 6.4 (*)    Albumin 3.4 (*)    AST 122 (*)    ALT 115 (*)    All other components within normal limits  CBC - Abnormal; Notable for the following components:   Hemoglobin 12.4  (*)    All other components within normal limits  URINALYSIS, ROUTINE W REFLEX MICROSCOPIC - Abnormal; Notable for the following components:   Color, Urine STRAW (*)    All other components within normal limits  LACTIC ACID, PLASMA - Abnormal; Notable for the following components:   Lactic Acid, Venous 2.7 (*)    All other components within normal limits  HEMOGLOBIN AND HEMATOCRIT, BLOOD - Abnormal; Notable for the following components:   Hemoglobin 12.2 (*)    HCT 38.4 (*)    All other components within normal limits  I-STAT CHEM 8, ED - Abnormal; Notable for the following components:   Glucose, Bld 124 (*)    Calcium, Ion 1.12 (*)    All other components within normal limits  ETHANOL  PROTIME-INR  SAMPLE TO BLOOD BANK    EKG None  Radiology DG Tibia/Fibula Left  Result Date: 10/17/2022 CLINICAL DATA:  Trauma EXAM: LEFT TIBIA AND FIBULA - 2 VIEW COMPARISON:  None Available. FINDINGS: There is no evidence of fracture or other focal bone lesions. Multiple small 2-3 mm calcifications, which may represent dermal calcifications. IMPRESSION: 1. No acute fracture. 2. Multiple small 2-3 mm calcifications, which may represent dermal calcifications. Electronically Signed   By: Larose Hires D.O.   On: 10/17/2022 13:27   DG Forearm Left  Result Date: 10/17/2022 CLINICAL DATA:  Trauma EXAM: LEFT FOREARM - 2 VIEW COMPARISON:  None Available. FINDINGS: There is no evidence of fracture or other focal bone lesions. 7 x 2 mm calcification about the anterior aspect of the mid forearm, likely sequela of prior infectious/inflammatory process or trauma. IMPRESSION: No fracture or dislocation of the left forearm. Electronically Signed   By: Larose Hires D.O.   On: 10/17/2022 13:26    Procedures Procedures    Medications Ordered in ED Medications  ceFAZolin (ANCEF) IVPB 2g/100 mL premix (0 g Intravenous Stopped 10/17/22 1344)  Tdap (BOOSTRIX) injection 0.5 mL (0.5 mLs Intramuscular Given 10/17/22 1213)   sodium chloride 0.9 % bolus 1,000 mL (0 mLs Intravenous Stopped 10/17/22 1344)  fentaNYL (SUBLIMAZE) injection 50 mcg (50 mcg Intravenous Given 10/17/22 1214)  bupivacaine liposome (EXPAREL) 1.3 % injection 266 mg (266 mg Infiltration Given 10/17/22 1550)  bupivacaine (MARCAINE) 0.5 % (with pres) injection 50 mL (50 mLs Infiltration Given 10/17/22 1549)    ED Course/ Medical Decision Making/ A&P  Medical Decision Making Amount and/or Complexity of Data Reviewed Labs: ordered. Radiology: ordered.  Risk Prescription drug management.    [name] is a 67 y.o. male with unknown past medical history who presents as a level 2 trauma for extremity lacerations and significant blood loss.  According to EMS report and patient report, patient was using a oscillating tile cutter when it cut him in the left forearm and the left shin.  Patient had deep wounds that was bleeding profusely per EMS.  Patient had it wrapped by fire and EMS and did not use a tourniquet.  They report during transport initially they could not get blood pressures on him but then gave him some fluids and laid him down and he they were able to get blood pressures in the 130s.  They otherwise have not seen hypotension.  Patient denied using blood thinner use and denied any other injuries.  He is feeling fatigued and lightheaded.  On arrival, airway is intact.  Breath sounds are equal bilaterally.  Patient's wounds were examined and he does have both pressurized and pulsatile bleeding out of the left shin shooting across the room.  He did have a palpable DP pulse and dopplerable PT pulse in the left ankle.  He had intact sensation and can wiggle his toes.  Patient has laceration on his left forearm that is approximately 5 cm and had venous bleeding.  Intact sensation, strength, and pulses in the left hand.  He is right-handed.  Rest of exam unremarkable initially but patient is somewhat pale and  ill-appearing.  Patient receiving fluids.  Due to the pulsatile extremity bleeding, vascular surgery initially was called.  They came and evaluate the wound and feel this is more of a trauma washout type wound.  Trauma will be called to help manage the wound at this time.  Anticipate follow-up on the patient's lab testing and due to the amount of bleeding, patient may require admission.  Will defer to trauma for further management.  Will get x-rays of the wound sites.  Spoke to trauma who will come see patient.  Will await results of labs and xrays.   X-rays did not show acute fractures.  Hemoglobin similar at 12.  2 from 12.4.  Trauma will repair laceration and patient be discharged for follow-up in clinic.  Anticipate discharge shortly after wound repair.        Final Clinical Impression(s) / ED Diagnoses Final diagnoses:  Laceration of left lower extremity, initial encounter  Laceration of left forearm, initial encounter    Clinical Impression: 1. Laceration of left lower extremity, initial encounter   2. Laceration of left forearm, initial encounter     Disposition: Admit  This note was prepared with assistance of Dragon voice recognition software. Occasional wrong-word or sound-a-like substitutions may have occurred due to the inherent limitations of voice recognition software.      Reilley Latorre, Canary Brim, MD 10/17/22 [phone]

## 2022-10-17 NOTE — ED Notes (Signed)
MD Lovick Paged by Anell Barr, RN.

## 2022-10-17 NOTE — Progress Notes (Signed)
Orthopedic Tech Progress Note Patient Details:  [name] [birth year] [id number] Level 2 Trauma not needed. Patient ID: Paul Edwards, male   DOB: 06-Oct-1955, 67 y.o.   MRN: [id number]  Blase Mess 10/17/2022, 12:08 PM

## 2022-10-17 NOTE — Consult Note (Signed)
Paul Edwards 1955/05/28  [id number].    Requesting MD: Tegeler, MD Chief Complaint/Reason for Consult: lacerations to left arm, left leg  HPI:  67 y/o M with unknown PMH who presented via EMS as a level 2 trauma after accidentally lacerating his own left arm and left leg while using some sort of oscillating or rotating saw at home. He has remained hemodynamically stable without blood products. Patient was evaluated by Dr. Karin Lieu with vascular surgery and significant venous/arterial injury was ruled out. Trauma surgery is consulted for tertiary survey and laceration closure. Patient denies use of blood thinners.   ROS: Negative other than HPI  No family history on file.  No past medical history on file.   Social History:  has no history on file for tobacco use, alcohol use, and drug use.  Allergies: Not on File  (Not in a hospital admission)    Physical Exam: Blood pressure (!) 151/95, pulse 67, temperature 98.8 F (37.1 C), temperature source Oral, resp. rate (!) 22, height 5\' 8"  (1.727 m), weight 79.4 kg, SpO2 98 %. General: Pleasant male, no acute distress HEENT: head -normocephalic, atraumatic; Eyes: PERRLA, no conjunctival injection, anicteric sclerae Neck- Trachea is midline, no thyromegaly or JVD appreciated.  CV- RRR, normal S1/S2, no M/R/G, radial and dorsalis pedis pulses 2+ BL, cap refill < 2 seconds. Pulm- breathing is non-labored ORA. CTABL, no wheezes, rhales, rhonchi. Abd- soft, NT/ND, no masses, hernias, or organomegaly. GU- deferred  MSK-  Laceration to left wrist, ~7 cm in length, without pulsatile bleeding. Radial pulse 2+. NVI. Left lower leg laceration, anterior and medial, > 10 cm in length, with significant sanguinous oozing but without pulsatile bleeding. Palpable pedal/PT pulses. NVI. Neuro- CN II-XII grossly in tact, non focal Psych- Alert and Oriented x3 with appropriate affect Skin: warm and dry, no rashes or lesions   Results for orders  placed or performed during the hospital encounter of 10/17/22 (from the past 48 hour(s))  Comprehensive metabolic panel     Status: Abnormal   Collection Time: 10/17/22 11:59 AM  Result Value Ref Range   Sodium 140 135 - 145 mmol/L   Potassium 3.6 3.5 - 5.1 mmol/L   Chloride 106 98 - 111 mmol/L   CO2 25 22 - 32 mmol/L   Glucose, Bld 127 (H) 70 - 99 mg/dL    Comment: Glucose reference range applies only to samples taken after fasting for at least 8 hours.   BUN 12 8 - 23 mg/dL   Creatinine, Ser 4.25 0.61 - 1.24 mg/dL   Calcium 8.6 (L) 8.9 - 10.3 mg/dL   Total Protein 6.4 (L) 6.5 - 8.1 g/dL   Albumin 3.4 (L) 3.5 - 5.0 g/dL   AST 956 (H) 15 - 41 U/L   ALT 115 (H) 0 - 44 U/L   Alkaline Phosphatase 72 38 - 126 U/L   Total Bilirubin 0.5 0.3 - 1.2 mg/dL   GFR, Estimated >38 >75 mL/min    Comment: (NOTE) Calculated using the CKD-EPI Creatinine Equation (2021)    Anion gap 9 5 - 15    Comment: Performed at [facility], [address] [place], [place] [address]  CBC     Status: Abnormal   Collection Time: 10/17/22 11:59 AM  Result Value Ref Range   WBC 5.8 4.0 - 10.5 K/uL   RBC 4.76 4.22 - 5.81 MIL/uL   Hemoglobin 12.4 (L) 13.0 - 17.0 g/dL   HCT 95.1 88.4 - 16.6 %  MCV 82.8 80.0 - 100.0 fL   MCH 26.1 26.0 - 34.0 pg   MCHC 31.5 30.0 - 36.0 g/dL   RDW 91.4 78.2 - 95.6 %   Platelets 172 150 - 400 K/uL   nRBC 0.0 0.0 - 0.2 %    Comment: Performed at [facility], [address], [place], [place] [address]  Ethanol     Status: None   Collection Time: 10/17/22 11:59 AM  Result Value Ref Range   Alcohol, Ethyl (B) <10 <10 mg/dL    Comment: (NOTE) Lowest detectable limit for serum alcohol is 10 mg/dL.  For medical purposes only. Performed at [facility], [address], [place], [place] [address]   Lactic acid, plasma     Status: Abnormal   Collection Time: 10/17/22 11:59 AM  Result Value Ref Range   Lactic Acid, Venous 2.7 (HH) 0.5 - 1.9 mmol/L    Comment:  CRITICAL RESULT CALLED TO, READ BACK BY AND VERIFIED WITH MARY REGEIS RN @1410  07.08.2024 E.AHMED Performed at [facility], [address] [place], [place] [address]   Protime-INR     Status: None   Collection Time: 10/17/22 11:59 AM  Result Value Ref Range   Prothrombin Time 14.4 11.4 - 15.2 seconds   INR 1.1 0.8 - 1.2    Comment: (NOTE) INR goal varies based on device and disease states. Performed at [facility], [address], [place], [place] [address]   I-Stat Chem 8, ED     Status: Abnormal   Collection Time: 10/17/22 12:19 PM  Result Value Ref Range   Sodium 141 135 - 145 mmol/L   Potassium 3.5 3.5 - 5.1 mmol/L   Chloride 106 98 - 111 mmol/L   BUN 12 8 - 23 mg/dL   Creatinine, Ser 3.24 0.61 - 1.24 mg/dL   Glucose, Bld 401 (H) 70 - 99 mg/dL    Comment: Glucose reference range applies only to samples taken after fasting for at least 8 hours.   Calcium, Ion 1.12 (L) 1.15 - 1.40 mmol/L   TCO2 24 22 - 32 mmol/L   Hemoglobin 13.3 13.0 - 17.0 g/dL   HCT 02.7 25.3 - 66.4 %  Urinalysis, Routine w reflex microscopic -Urine, Clean Catch     Status: Abnormal   Collection Time: 10/17/22  2:15 PM  Result Value Ref Range   Color, Urine STRAW (A) YELLOW   APPearance CLEAR CLEAR   Specific Gravity, Urine 1.009 1.005 - 1.030   pH 6.0 5.0 - 8.0   Glucose, UA NEGATIVE NEGATIVE mg/dL   Hgb urine dipstick NEGATIVE NEGATIVE   Bilirubin Urine NEGATIVE NEGATIVE   Ketones, ur NEGATIVE NEGATIVE mg/dL   Protein, ur NEGATIVE NEGATIVE mg/dL   Nitrite NEGATIVE NEGATIVE   Leukocytes,Ua NEGATIVE NEGATIVE    Comment: Performed at [facility], [address] [place], [place] [address]  Sample to Blood Bank     Status: None   Collection Time: 10/17/22  2:15 PM  Result Value Ref Range   Blood Bank Specimen SAMPLE AVAILABLE FOR TESTING    Sample Expiration      10/20/2022,2359 Performed at [facility], [address] [place], [place] [address]   Hemoglobin and  hematocrit, blood     Status: Abnormal   Collection Time: 10/17/22  2:15 PM  Result Value Ref Range   Hemoglobin 12.2 (L) 13.0 - 17.0 g/dL   HCT 63.8 (L) 75.6 - 43.3 %  Comment: Performed at [facility], [address] [place], [place] [address]   DG Tibia/Fibula Left  Result Date: 10/17/2022 CLINICAL DATA:  Trauma EXAM: LEFT TIBIA AND FIBULA - 2 VIEW COMPARISON:  None Available. FINDINGS: There is no evidence of fracture or other focal bone lesions. Multiple small 2-3 mm calcifications, which may represent dermal calcifications. IMPRESSION: 1. No acute fracture. 2. Multiple small 2-3 mm calcifications, which may represent dermal calcifications. Electronically Signed   By: Larose Hires D.O.   On: 10/17/2022 13:27   DG Forearm Left  Result Date: 10/17/2022 CLINICAL DATA:  Trauma EXAM: LEFT FOREARM - 2 VIEW COMPARISON:  None Available. FINDINGS: There is no evidence of fracture or other focal bone lesions. 7 x 2 mm calcification about the anterior aspect of the mid forearm, likely sequela of prior infectious/inflammatory process or trauma. IMPRESSION: No fracture or dislocation of the left forearm. Electronically Signed   By: Larose Hires D.O.   On: 10/17/2022 13:26      Assessment/Plan 67 y/o male with accidental self-inflected lacerations to left forearm and left shin, as above. No fractures or major vascular injuries identified. Lacerations repaired in the ED (see separate procedure note for details). Tdap and ancef given in ED. I have scheduled the patient for suture removal in our office on 10/31/22 at 10:00 AM. No additional injuries Identified on tertiary survey. Stable for discharge from a trauma perspective.    I reviewed ED provider notes, last 24 h vitals and pain scores, last 48 h intake and output, last 24 h labs and trends, and last 24 h imaging results.  Trixie Deis, [facility] Surgery 10/17/2022, 3:52 PM Please see Amion for pager number during day hours  7:00am-4:30pm or 7:00am -11:30am on weekends

## 2022-10-17 NOTE — ED Triage Notes (Signed)
Pt to ED via EMS from home. Pt was using an a sharp tool when pt accidentally cut his left wrist (4-5cm lac) and his left lower anterior leg (7-10in lac). Left lower lac arterial bleed. Upon EMS arrival, pt did not have any radial pulses. EMS gave 500cc NS bolus en route and gained radial pulses back. + radial and pedal pulses upon arrival to ED. Pt is spanish speaking. Pt is AAOx4 upon arrival to ED. Bleeding controlled upon arrival to ED. sensation intact upon arrival to ED.    EMS: 136/78 70 HR 18 RAC

## 2022-10-17 NOTE — Discharge Instructions (Signed)
Your lacerations were repaired by the trauma team in the emergency department today.  They feel you are safe for discharge home, please follow-up in 10 to 14 days for suture removal.  Please watch for signs and symptoms of infection.  We did update your tetanus shot today and you had a dose of IV antibiotics on arrival.  They do not feel you need antibiotics going home but please watch for development of infection.  Please rest and stay hydrated.  If any symptoms change or worsen acutely, please return to the nearest emergency department.

## 2022-10-17 NOTE — ED Notes (Addendum)
MD Karin Lieu from vasculature at bedside.

## 2022-10-17 NOTE — ED Notes (Signed)
Date and time results received: 10/17/22 1410 (use smartphrase ".now" to insert current time)  Test: lactic acid Critical Value: 2.7  Name of Provider Notified: Tegeler  Orders Received? Or Actions Taken?:  notified

## 2022-11-24 ENCOUNTER — Encounter: Payer: Self-pay | Admitting: Nurse Practitioner

## 2022-11-24 ENCOUNTER — Ambulatory Visit: Payer: Medicare Other | Admitting: Nurse Practitioner

## 2022-11-24 VITALS — BP 150/100 | HR 64 | Ht 68.5 in | Wt 184.6 lb

## 2022-11-24 DIAGNOSIS — N401 Enlarged prostate with lower urinary tract symptoms: Secondary | ICD-10-CM

## 2022-11-24 DIAGNOSIS — I700 Atherosclerosis of aorta: Secondary | ICD-10-CM

## 2022-11-24 DIAGNOSIS — Z23 Encounter for immunization: Secondary | ICD-10-CM

## 2022-11-24 DIAGNOSIS — D509 Iron deficiency anemia, unspecified: Secondary | ICD-10-CM | POA: Diagnosis not present

## 2022-11-24 DIAGNOSIS — J4541 Moderate persistent asthma with (acute) exacerbation: Secondary | ICD-10-CM | POA: Diagnosis not present

## 2022-11-24 DIAGNOSIS — R109 Unspecified abdominal pain: Secondary | ICD-10-CM

## 2022-11-24 DIAGNOSIS — E1159 Type 2 diabetes mellitus with other circulatory complications: Secondary | ICD-10-CM

## 2022-11-24 DIAGNOSIS — M5432 Sciatica, left side: Secondary | ICD-10-CM

## 2022-11-24 DIAGNOSIS — E1169 Type 2 diabetes mellitus with other specified complication: Secondary | ICD-10-CM

## 2022-11-24 DIAGNOSIS — E785 Hyperlipidemia, unspecified: Secondary | ICD-10-CM | POA: Diagnosis not present

## 2022-11-24 DIAGNOSIS — E039 Hypothyroidism, unspecified: Secondary | ICD-10-CM | POA: Diagnosis not present

## 2022-11-24 DIAGNOSIS — Z794 Long term (current) use of insulin: Secondary | ICD-10-CM | POA: Diagnosis not present

## 2022-11-24 DIAGNOSIS — I152 Hypertension secondary to endocrine disorders: Secondary | ICD-10-CM

## 2022-11-24 DIAGNOSIS — E1165 Type 2 diabetes mellitus with hyperglycemia: Secondary | ICD-10-CM

## 2022-11-24 DIAGNOSIS — I10 Essential (primary) hypertension: Secondary | ICD-10-CM | POA: Diagnosis not present

## 2022-11-24 MED ORDER — LEVOTHYROXINE SODIUM 25 MCG PO TABS
25.0000 ug | ORAL_TABLET | Freq: Every day | ORAL | 3 refills | Status: DC
Start: 2022-11-24 — End: 2022-12-07

## 2022-11-24 MED ORDER — AMLODIPINE-ATORVASTATIN 5-20 MG PO TABS
1.0000 | ORAL_TABLET | Freq: Every day | ORAL | 3 refills | Status: DC
Start: 2022-11-24 — End: 2023-02-27

## 2022-11-24 MED ORDER — TAMSULOSIN HCL 0.4 MG PO CAPS
0.4000 mg | ORAL_CAPSULE | Freq: Every day | ORAL | 3 refills | Status: DC
Start: 2022-11-24 — End: 2023-11-07

## 2022-11-24 MED ORDER — GLUCOSE BLOOD VI STRP
ORAL_STRIP | 99 refills | Status: DC
Start: 2022-11-24 — End: 2024-01-01

## 2022-11-24 MED ORDER — GABAPENTIN 300 MG PO CAPS: 300.0000 mg | ORAL_CAPSULE | Freq: Every day | ORAL | 3 refills | Status: DC

## 2022-11-24 MED ORDER — ONDANSETRON 4 MG PO TBDP: 4.0000 mg | ORAL_TABLET | Freq: Three times a day (TID) | ORAL | 6 refills | Status: AC | PRN

## 2022-11-24 MED ORDER — METFORMIN HCL 500 MG PO TABS
ORAL_TABLET | ORAL | 3 refills | Status: DC
Start: 2022-11-24 — End: 2023-03-21

## 2022-11-24 MED ORDER — OMEPRAZOLE 40 MG PO CPDR
40.0000 mg | DELAYED_RELEASE_CAPSULE | Freq: Every day | ORAL | 3 refills | Status: DC
Start: 2022-11-24 — End: 2023-11-07

## 2022-11-24 MED ORDER — MONTELUKAST SODIUM 10 MG PO TABS
10.0000 mg | ORAL_TABLET | Freq: Every day | ORAL | 3 refills | Status: DC
Start: 2022-11-24 — End: 2023-11-07

## 2022-11-24 MED ORDER — ONETOUCH DELICA LANCETS 33G MISC: 99 refills | Status: DC

## 2022-11-24 NOTE — Progress Notes (Signed)
[name], DNP, AGNP-c [facility]  [address] [place], [place] [address] [phone]  ESTABLISHED PATIENT- Chronic Health and/or Follow-Up Visit  Blood pressure (!) 150/100, pulse 64, height 5' 8.5" (1.74 m), weight 184 lb 9.6 oz (83.7 kg).    Paul Edwards is a 67 y.o. year old male presenting today for evaluation and management of chronic conditions. He is with his daughter, [name] is his interpreter today.   [name] recently had an accident recently with a power tool. He tells me he dropped the tool while using it and it cut is left arm and left calf considerably. He was seen in the ED and the area was repaired with sutures. Since then he has had the sutures removed and the area is healing well. He reports tingling and numbness on the calf distally to the laceration. There is slight tenderness on the distal end of the cut on his arm. There is no evidence of infection.   He reports his blood sugars have been controlled. He has been monitoring his diet and reducing his carb intake. He has not been monitoring his blood pressures. He has not had headaches, palpitations, dizziness, or vision changes.   He does still have nausea and this can make eating difficult at times.    All ROS negative with exception of what is listed above.   PHYSICAL EXAM Physical Exam Vitals and nursing note reviewed.  Constitutional:      General: He is not in acute distress.    Appearance: Normal appearance.  Eyes:     General: No scleral icterus.    Conjunctiva/sclera: Conjunctivae normal.  Neck:     Vascular: No carotid bruit.  Cardiovascular:     Rate and Rhythm: Normal rate and regular rhythm.     Pulses: Normal pulses.     Heart sounds: Normal heart sounds.  Pulmonary:     Effort: Pulmonary effort is normal.     Breath sounds: Normal breath sounds.  Abdominal:     General: Bowel sounds are normal. There is no distension.     Palpations: Abdomen is soft.     Tenderness:  There is no abdominal tenderness.  Musculoskeletal:     Right lower leg: No edema.     Left lower leg: No edema.  Skin:    General: Skin is warm and dry.     Capillary Refill: Capillary refill takes less than 2 seconds.       Neurological:     General: No focal deficit present.     Mental Status: He is alert and oriented to person, place, and time.     Motor: No weakness.  Psychiatric:        Mood and Affect: Mood normal.     PLAN Problem List Items Addressed This Visit     Hypertension associated with diabetes (HCC)    Chronic. Blood pressures are not well controlled today. It is unclear how they are running at home. We will plan to continue to monitor at home and if blood pressure is consistently higher than 130/85, we will increase his blood pressure medication. Medication changed today to combine amlodipine and atorvastatin into single dose.       Relevant Medications   metFORMIN (GLUCOPHAGE) 500 MG tablet   amLODipine-atorvastatin (CADUET) 5-20 MG tablet   aspirin EC 81 MG tablet   Other Relevant Orders   Hemoglobin A1c (Completed)   Comprehensive metabolic panel (Completed)   LP+LDL Direct (Completed)   Benign prostatic  hyperplasia with weak urinary stream    Chronic. Symptoms well controlled with flomax. Refills provided.       Relevant Medications   tamsulosin (FLOMAX) 0.4 MG CAPS capsule   Other Relevant Orders   PSA, total and free (Completed)   Aortic atherosclerosis (HCC)    Atherosclerosis in the setting of HTN, DM, and HLD. Will plan to combine atorvastatin with blood pressure medication for single pill and increase dose to 20mg  daily to help with control.       Relevant Medications   amLODipine-atorvastatin (CADUET) 5-20 MG tablet   aspirin EC 81 MG tablet   Other Relevant Orders   Hemoglobin A1c (Completed)   Comprehensive metabolic panel (Completed)   LP+LDL Direct (Completed)   Hypothyroidism    Chronic. Currently on levothyroxine . Labs  pending. No symptoms.       Relevant Medications   levothyroxine (SYNTHROID) 25 MCG tablet   Other Relevant Orders   TSH (Completed)   Type 2 diabetes mellitus with hyperglycemia, with long-term current use of insulin (HCC) - Primary    Chronic associated with HTN, HLD, & aortic atherosclerosis. Currently managed with metformin. Last A1c 02/2022 was 7%. Foot exam completed today. Urine micro completed today. Will update A1c and lipids today. On statin therapy.        Relevant Medications   metFORMIN (GLUCOPHAGE) 500 MG tablet   amLODipine-atorvastatin (CADUET) 5-20 MG tablet   glucose blood test strip   OneTouch Delica Lancets 33G MISC   aspirin EC 81 MG tablet   Other Relevant Orders   Hemoglobin A1c (Completed)   Comprehensive metabolic panel (Completed)   LP+LDL Direct (Completed)   Microalbumin/Creatinine Ratio, Urine (Completed)   HM Diabetes Foot Exam (Completed)   Hyperlipidemia associated with type 2 diabetes mellitus (HCC)    Chronic. Atorvastatin combined with amlodipine and increased today for improved control.       Relevant Medications   metFORMIN (GLUCOPHAGE) 500 MG tablet   amLODipine-atorvastatin (CADUET) 5-20 MG tablet   aspirin EC 81 MG tablet   Other Relevant Orders   Hemoglobin A1c (Completed)   Comprehensive metabolic panel (Completed)   LP+LDL Direct (Completed)   Microalbumin/Creatinine Ratio, Urine (Completed)   Moderate persistent asthma with acute exacerbation    Refills provided. No concerns or symptoms at this time. No longer using Trelegy. Will monitor.       Relevant Medications   montelukast (SINGULAIR) 10 MG tablet   Other Visit Diagnoses     Iron deficiency anemia, unspecified iron deficiency anemia type       Relevant Orders   CBC with Differential/Platelet (Completed)   Iron, TIBC and Ferritin Panel (Completed)   Need for shingles vaccine       Abdominal pain, unspecified abdominal location       Relevant Medications   omeprazole  (PRILOSEC) 40 MG capsule   Sciatica of left side       Relevant Medications   gabapentin (NEURONTIN) 300 MG capsule       Return in about 3 months (around 02/24/2023) for AWV.  Time: 42 minutes, >50% spent counseling, care coordination, chart review, and documentation.   [name], DNP, AGNP-c

## 2022-11-24 NOTE — Assessment & Plan Note (Addendum)
Chronic associated with HTN, HLD, & aortic atherosclerosis. Currently managed with metformin. Last A1c 02/2022 was 7%. Foot exam completed today. Urine micro completed today. Will update A1c and lipids today. On statin therapy.

## 2022-11-24 NOTE — Patient Instructions (Addendum)
I would like to see you back in 3 months to have your annual wellness visit.   We will make sure your blood pressure looks better at that time.   I have sent in all of your medications. Let me know if you have any problems getting them.

## 2022-11-25 LAB — CBC WITH DIFFERENTIAL/PLATELET
Basophils Absolute: 0.1 10*3/uL (ref 0.0–0.2)
Basos: 1 %
EOS (ABSOLUTE): 0.4 10*3/uL (ref 0.0–0.4)
Eos: 6 %
Hematocrit: 42.2 % (ref 37.5–51.0)
Hemoglobin: 13.2 g/dL (ref 13.0–17.7)
Immature Grans (Abs): 0 10*3/uL (ref 0.0–0.1)
Immature Granulocytes: 1 %
Lymphocytes Absolute: 1.7 10*3/uL (ref 0.7–3.1)
Lymphs: 29 %
MCH: 26.2 pg — ABNORMAL LOW (ref 26.6–33.0)
MCHC: 31.3 g/dL — ABNORMAL LOW (ref 31.5–35.7)
MCV: 84 fL (ref 79–97)
Monocytes Absolute: 0.6 10*3/uL (ref 0.1–0.9)
Monocytes: 10 %
Neutrophils Absolute: 3.1 10*3/uL (ref 1.4–7.0)
Neutrophils: 53 %
Platelets: 187 10*3/uL (ref 150–450)
RBC: 5.04 x10E6/uL (ref 4.14–5.80)
RDW: 12.7 % (ref 11.6–15.4)
WBC: 5.9 10*3/uL (ref 3.4–10.8)

## 2022-11-25 LAB — HEMOGLOBIN A1C
Est. average glucose Bld gHb Est-mCnc: 146 mg/dL
Hgb A1c MFr Bld: 6.7 % — ABNORMAL HIGH (ref 4.8–5.6)

## 2022-11-25 LAB — COMPREHENSIVE METABOLIC PANEL
ALT: 126 IU/L — ABNORMAL HIGH (ref 0–44)
AST: 106 IU/L — ABNORMAL HIGH (ref 0–40)
Albumin: 4.3 g/dL (ref 3.9–4.9)
Alkaline Phosphatase: 118 IU/L (ref 44–121)
BUN/Creatinine Ratio: 13 (ref 10–24)
BUN: 14 mg/dL (ref 8–27)
Bilirubin Total: 0.3 mg/dL (ref 0.0–1.2)
CO2: 23 mmol/L (ref 20–29)
Calcium: 9.5 mg/dL (ref 8.6–10.2)
Chloride: 102 mmol/L (ref 96–106)
Creatinine, Ser: 1.05 mg/dL (ref 0.76–1.27)
Globulin, Total: 2.8 g/dL (ref 1.5–4.5)
Glucose: 113 mg/dL — ABNORMAL HIGH (ref 70–99)
Potassium: 4.4 mmol/L (ref 3.5–5.2)
Sodium: 141 mmol/L (ref 134–144)
Total Protein: 7.1 g/dL (ref 6.0–8.5)
eGFR: 78 mL/min/{1.73_m2} (ref >59)

## 2022-11-25 LAB — LP+LDL DIRECT
Cholesterol, Total: 175 mg/dL (ref 100–199)
HDL: 36 mg/dL — ABNORMAL LOW (ref >39)
LDL Chol Calc (NIH): 101 mg/dL — ABNORMAL HIGH (ref 0–99)
LDL Direct: 107 mg/dL — ABNORMAL HIGH (ref 0–99)
Triglycerides: 223 mg/dL — ABNORMAL HIGH (ref 0–149)
VLDL Cholesterol Cal: 38 mg/dL (ref 5–40)

## 2022-11-25 LAB — IRON,TIBC AND FERRITIN PANEL
Ferritin: 349 ng/mL (ref 30–400)
Iron Saturation: 18 % (ref 15–55)
Iron: 64 ug/dL (ref 38–169)
Total Iron Binding Capacity: 353 ug/dL (ref 250–450)
UIBC: 289 ug/dL (ref 111–343)

## 2022-11-25 LAB — PSA, TOTAL AND FREE
PSA, Free Pct: 13.9 %
PSA, Free: 1.06 ng/mL
Prostate Specific Ag, Serum: 7.6 ng/mL — ABNORMAL HIGH (ref 0.0–4.0)

## 2022-11-25 LAB — MICROALBUMIN / CREATININE URINE RATIO
Creatinine, Urine: 113 mg/dL
Microalb/Creat Ratio: 12 mg/g{creat} (ref 0–29)
Microalbumin, Urine: 13.9 ug/mL

## 2022-11-25 LAB — TSH: TSH: 6.76 u[IU]/mL — ABNORMAL HIGH (ref 0.450–4.500)

## 2022-12-05 ENCOUNTER — Telehealth: Payer: Self-pay | Admitting: Nurse Practitioner

## 2022-12-05 NOTE — Telephone Encounter (Signed)
Recv'd fax from [facility] that Contour next test strips not covered, called pharmacy & One touch Delicate, One Touch Ultra or Accu-chek neither are covered,  Cheapest is Accu-chek Guide Me meter cost is $15, had pharmacist check and the lancets & test strips are covered $0 co pay, .  I called daughter & left message that switching diabetes supplies but will have to pay $15 for meter.   Please update med list to Accu-chek Guide me meter, strips & lancets

## 2022-12-06 MED ORDER — ASPIRIN 81 MG PO TBEC: 81.0000 mg | DELAYED_RELEASE_TABLET | Freq: Every day | ORAL | Status: DC

## 2022-12-06 NOTE — Assessment & Plan Note (Signed)
Atherosclerosis in the setting of HTN, DM, and HLD. Will plan to combine atorvastatin with blood pressure medication for single pill and increase dose to 20mg  daily to help with control.

## 2022-12-06 NOTE — Assessment & Plan Note (Signed)
Chronic. Currently on levothyroxine . Labs pending. No symptoms.

## 2022-12-06 NOTE — Assessment & Plan Note (Signed)
Refills provided. No concerns or symptoms at this time. No longer using Trelegy. Will monitor.

## 2022-12-06 NOTE — Assessment & Plan Note (Signed)
Chronic. Blood pressures are not well controlled today. It is unclear how they are running at home. We will plan to continue to monitor at home and if blood pressure is consistently higher than 130/85, we will increase his blood pressure medication. Medication changed today to combine amlodipine and atorvastatin into single dose.

## 2022-12-06 NOTE — Assessment & Plan Note (Signed)
Chronic. Atorvastatin combined with amlodipine and increased today for improved control.

## 2022-12-06 NOTE — Assessment & Plan Note (Signed)
Chronic. Symptoms well controlled with flomax. Refills provided.

## 2022-12-07 ENCOUNTER — Other Ambulatory Visit: Payer: Self-pay

## 2022-12-07 ENCOUNTER — Other Ambulatory Visit: Payer: Self-pay | Admitting: Nurse Practitioner

## 2022-12-07 DIAGNOSIS — R972 Elevated prostate specific antigen [PSA]: Secondary | ICD-10-CM

## 2022-12-07 DIAGNOSIS — E559 Vitamin D deficiency, unspecified: Secondary | ICD-10-CM

## 2022-12-07 DIAGNOSIS — R7989 Other specified abnormal findings of blood chemistry: Secondary | ICD-10-CM

## 2022-12-07 DIAGNOSIS — E039 Hypothyroidism, unspecified: Secondary | ICD-10-CM

## 2022-12-07 MED ORDER — VITAMIN D (ERGOCALCIFEROL) 1.25 MG (50000 UNIT) PO CAPS
50000.0000 [IU] | ORAL_CAPSULE | ORAL | 3 refills | Status: DC
Start: 2022-12-07 — End: 2023-11-07

## 2022-12-07 MED ORDER — LEVOTHYROXINE SODIUM 50 MCG PO TABS
50.0000 ug | ORAL_TABLET | Freq: Every day | ORAL | 1 refills | Status: DC
Start: 2022-12-07 — End: 2023-03-21

## 2022-12-07 MED ORDER — ACCU-CHEK GUIDE ME W/DEVICE KIT: 1.0000 | PACK | 0 refills | Status: AC

## 2022-12-07 NOTE — Telephone Encounter (Signed)
done

## 2022-12-23 ENCOUNTER — Ambulatory Visit (HOSPITAL_COMMUNITY): Payer: Medicare Other

## 2023-01-18 NOTE — Progress Notes (Signed)
I reviewed the chart and patient was a level 2 trauma therefore the trauma order set was utilized that included alcohol/drug screening.  This is why it was ordered.

## 2023-02-27 ENCOUNTER — Ambulatory Visit: Payer: Medicare Other | Admitting: Nurse Practitioner

## 2023-02-27 ENCOUNTER — Encounter: Payer: Self-pay | Admitting: Nurse Practitioner

## 2023-02-27 VITALS — BP 138/88 | HR 66 | Wt 182.2 lb

## 2023-02-27 DIAGNOSIS — I700 Atherosclerosis of aorta: Secondary | ICD-10-CM

## 2023-02-27 DIAGNOSIS — R195 Other fecal abnormalities: Secondary | ICD-10-CM | POA: Diagnosis not present

## 2023-02-27 DIAGNOSIS — I152 Hypertension secondary to endocrine disorders: Secondary | ICD-10-CM

## 2023-02-27 DIAGNOSIS — E1159 Type 2 diabetes mellitus with other circulatory complications: Secondary | ICD-10-CM

## 2023-02-27 DIAGNOSIS — Z794 Long term (current) use of insulin: Secondary | ICD-10-CM | POA: Diagnosis not present

## 2023-02-27 DIAGNOSIS — E039 Hypothyroidism, unspecified: Secondary | ICD-10-CM

## 2023-02-27 DIAGNOSIS — G629 Polyneuropathy, unspecified: Secondary | ICD-10-CM | POA: Diagnosis not present

## 2023-02-27 DIAGNOSIS — Z1211 Encounter for screening for malignant neoplasm of colon: Secondary | ICD-10-CM

## 2023-02-27 DIAGNOSIS — E1165 Type 2 diabetes mellitus with hyperglycemia: Secondary | ICD-10-CM | POA: Diagnosis not present

## 2023-02-27 DIAGNOSIS — Z23 Encounter for immunization: Secondary | ICD-10-CM | POA: Diagnosis not present

## 2023-02-27 DIAGNOSIS — F5101 Primary insomnia: Secondary | ICD-10-CM | POA: Diagnosis not present

## 2023-02-27 DIAGNOSIS — N401 Enlarged prostate with lower urinary tract symptoms: Secondary | ICD-10-CM

## 2023-02-27 DIAGNOSIS — E785 Hyperlipidemia, unspecified: Secondary | ICD-10-CM | POA: Diagnosis not present

## 2023-02-27 DIAGNOSIS — E1169 Type 2 diabetes mellitus with other specified complication: Secondary | ICD-10-CM

## 2023-02-27 DIAGNOSIS — R3912 Poor urinary stream: Secondary | ICD-10-CM | POA: Diagnosis not present

## 2023-02-27 MED ORDER — TRAZODONE HCL 100 MG PO TABS: 100.0000 mg | ORAL_TABLET | Freq: Every evening | ORAL | 1 refills | Status: DC | PRN

## 2023-02-27 MED ORDER — AMLODIPINE-ATORVASTATIN 10-20 MG PO TABS: 1.0000 | ORAL_TABLET | Freq: Every day | ORAL | 1 refills | Status: DC

## 2023-02-27 NOTE — Patient Instructions (Signed)
You look great today!!! We will plan to see you in 6 months.   Please let me know if you have any changes or concerns in the meantime.

## 2023-02-27 NOTE — Assessment & Plan Note (Signed)
Chronic. Labs for monitoring today. No symptoms currently. Will make changes as necessary based on labs.

## 2023-02-27 NOTE — Assessment & Plan Note (Signed)
Difficulty sleeping 2-3 times a week, resulting in daytime sleepiness. Discussed trazodone as a potential treatment option. Explained that trazodone is not a sleep medicine but has a side effect of promoting sleep, and is safe for use in people over 65 without causing next-day sleepiness or other adverse effects. - Prescribe trazodone as needed for sleep

## 2023-02-27 NOTE — Assessment & Plan Note (Signed)
Currently managed with atorvastatin 20mg . No concerns present. Continue to monitor.

## 2023-02-27 NOTE — Progress Notes (Signed)
[name], DNP, AGNP-c [facility]  [address] [place], [place] [address] [phone]  ESTABLISHED PATIENT- Chronic Health and/or Follow-Up Visit  Blood pressure 138/88, pulse 66, weight 182 lb 3.2 oz (82.6 kg).    Paul Edwards is a 66 y.o. year old male presenting today for evaluation and management of chronic conditions.   History of Present Illness The patient, with a history of diabetes, hypertension, and prostate issues, reports stable blood sugars and denies any episodes of hypoglycemia. He has been experiencing headaches, described as originating from the back of the head and radiating to the eyes. The headaches are associated with shakiness, but eating does not seem to alleviate these symptoms. The patient also reports occasional high blood pressure readings at home, with values sometimes reaching 160 or 180.  In addition to these symptoms, the patient has been experiencing numbness and a burning sensation in his feet, extending up into his legs. He denies any swelling in the feet.  The patient also reports intermittent sleep disturbances, with episodes of insomnia occurring two to three times a week. During these episodes, he finds it difficult to fall asleep despite feeling tired. This has led to daytime sleepiness and short naps.  The patient's medication regimen includes gabapentin, which he reports has been helpful in managing the numbness and burning sensation in his feet. He has not started any new medications recently.  The patient's most recent labs showed a high prostate antigen level, and a referral to urology was made for further evaluation. The patient's A1c was reported to be very good at his last visit.  All ROS negative with exception of what is listed above.   PHYSICAL EXAM Physical Exam Vitals and nursing note reviewed.  Constitutional:      General: He is not in acute distress.    Appearance: Normal appearance. He is not  ill-appearing.  HENT:     Head: Normocephalic.     Right Ear: Tympanic membrane normal.     Left Ear: Tympanic membrane normal.     Nose: Nose normal.     Mouth/Throat:     Mouth: Mucous membranes are moist.     Pharynx: Oropharynx is clear. No posterior oropharyngeal erythema.  Eyes:     Conjunctiva/sclera: Conjunctivae normal.     Pupils: Pupils are equal, round, and reactive to light.  Neck:     Vascular: No carotid bruit.  Cardiovascular:     Rate and Rhythm: Normal rate and regular rhythm.     Pulses: Normal pulses.     Heart sounds: Normal heart sounds.  Pulmonary:     Effort: Pulmonary effort is normal.     Breath sounds: Normal breath sounds.  Abdominal:     General: Bowel sounds are normal. There is no distension.     Palpations: Abdomen is soft.     Tenderness: There is no abdominal tenderness. There is no guarding.  Musculoskeletal:        General: Normal range of motion.     Cervical back: No tenderness.     Right lower leg: No edema.     Left lower leg: No edema.  Lymphadenopathy:     Cervical: No cervical adenopathy.  Skin:    General: Skin is warm and dry.     Capillary Refill: Capillary refill takes less than 2 seconds.  Neurological:     Mental Status: He is alert and oriented to person, place, and time.     Sensory: No sensory deficit.  Motor: No weakness.     Coordination: Coordination normal.  Psychiatric:        Mood and Affect: Mood normal.        Behavior: Behavior normal.      PLAN Problem List Items Addressed This Visit     Hypertension associated with diabetes (HCC)    Intermittent elevated blood pressure readings at home (160-180 mmHg) with wrist cuff. No associated dizziness or chest pain. Possible contribution to headaches. At this it is unclear if the cuff is correct or his blood pressures are uncontrolled. We will increase his amlodipine today to 10mg  as his BP is high in the office.  - Monitor blood pressure at home - Spot check  blood pressure if symptoms persist - Follow-up in 6 months       Relevant Medications   amlodipine-atorvastatin (CADUET) 10-20 MG tablet   Other Relevant Orders   Hemoglobin A1c   CBC with Differential/Platelet   Vitamin B12   Comprehensive metabolic panel   TSH   Benign prostatic hyperplasia with weak urinary stream    PSA levels have been high in the past. Referral to urology was previously sent but follow-up is unclear. At this time he is not having any symptoms, but I would like him to have a provider in place in the event he is symptomatic or numbers increase.  - Resend referral to urology for elevated PSA      Relevant Medications   amlodipine-atorvastatin (CADUET) 10-20 MG tablet   Other Relevant Orders   Hemoglobin A1c   CBC with Differential/Platelet   Vitamin B12   Comprehensive metabolic panel   TSH   Aortic atherosclerosis (HCC)    Currently on statin therapy and working to keep blood sugars under control. Continue to monitor.       Relevant Medications   amlodipine-atorvastatin (CADUET) 10-20 MG tablet   Other Relevant Orders   Hemoglobin A1c   CBC with Differential/Platelet   Vitamin B12   Comprehensive metabolic panel   TSH   Hypothyroidism    Chronic. Labs for monitoring today. No symptoms currently. Will make changes as necessary based on labs.       Relevant Orders   Hemoglobin A1c   CBC with Differential/Platelet   Vitamin B12   Comprehensive metabolic panel   TSH   Type 2 diabetes mellitus with hyperglycemia, with long-term current use of insulin (HCC) - Primary    Blood sugars are well-controlled. No episodes of hypoglycemia. Last A1c was very good. Considering excellent control recently, we will change the time between visits to 6 months.  - Continue current diabetes management - Follow-up in 6 months       Relevant Medications   amlodipine-atorvastatin (CADUET) 10-20 MG tablet   Other Relevant Orders   Hemoglobin A1c   CBC with  Differential/Platelet   Vitamin B12   Comprehensive metabolic panel   TSH   Hyperlipidemia associated with type 2 diabetes mellitus (HCC)    Currently managed with atorvastatin 20mg . No concerns present. Continue to monitor.       Relevant Medications   amlodipine-atorvastatin (CADUET) 10-20 MG tablet   Other Relevant Orders   Hemoglobin A1c   CBC with Differential/Platelet   Vitamin B12   Comprehensive metabolic panel   TSH   Primary insomnia    Difficulty sleeping 2-3 times a week, resulting in daytime sleepiness. Discussed trazodone as a potential treatment option. Explained that trazodone is not a sleep medicine but has a side effect of  promoting sleep, and is safe for use in people over 65 without causing next-day sleepiness or other adverse effects. - Prescribe trazodone as needed for sleep      Relevant Medications   traZODone (DESYREL) 100 MG tablet   Neuropathy    Burning and numbness in feet, extending up into legs. Gabapentin provides some relief. No loss of sensation in feet at this time. Foot exam completed today.  - Continue gabapentin for neuropathy      Other Visit Diagnoses     Need for influenza vaccination       Relevant Orders   Flu Vaccine Trivalent High Dose (Fluad) (Completed)   Need for COVID-19 vaccine       Relevant Orders   Pfizer Comirnaty Covid -19 Vaccine 56yrs and older (Completed)   Screening for colon cancer       Relevant Orders   Cologuard       Return in about 6 months (around 08/27/2023) for CPE, Med Management 30.  [name], DNP, AGNP-c

## 2023-02-27 NOTE — Assessment & Plan Note (Signed)
Burning and numbness in feet, extending up into legs. Gabapentin provides some relief. No loss of sensation in feet at this time. Foot exam completed today.  - Continue gabapentin for neuropathy

## 2023-02-27 NOTE — Assessment & Plan Note (Signed)
PSA levels have been high in the past. Referral to urology was previously sent but follow-up is unclear. At this time he is not having any symptoms, but I would like him to have a provider in place in the event he is symptomatic or numbers increase.  - Resend referral to urology for elevated PSA

## 2023-02-27 NOTE — Assessment & Plan Note (Signed)
Currently on statin therapy and working to keep blood sugars under control. Continue to monitor.

## 2023-02-27 NOTE — Assessment & Plan Note (Signed)
Blood sugars are well-controlled. No episodes of hypoglycemia. Last A1c was very good. Considering excellent control recently, we will change the time between visits to 6 months.  - Continue current diabetes management - Follow-up in 6 months

## 2023-02-27 NOTE — Assessment & Plan Note (Signed)
Intermittent elevated blood pressure readings at home (160-180 mmHg) with wrist cuff. No associated dizziness or chest pain. Possible contribution to headaches. At this it is unclear if the cuff is correct or his blood pressures are uncontrolled. We will increase his amlodipine today to 10mg  as his BP is high in the office.  - Monitor blood pressure at home - Spot check blood pressure if symptoms persist - Follow-up in 6 months

## 2023-02-28 LAB — CBC WITH DIFFERENTIAL/PLATELET
Basophils Absolute: 0.1 10*3/uL (ref 0.0–0.2)
Basos: 1 %
EOS (ABSOLUTE): 0.3 10*3/uL (ref 0.0–0.4)
Eos: 4 %
Hematocrit: 44.3 % (ref 37.5–51.0)
Hemoglobin: 13.6 g/dL (ref 13.0–17.7)
Immature Grans (Abs): 0 10*3/uL (ref 0.0–0.1)
Immature Granulocytes: 1 %
Lymphocytes Absolute: 1.9 10*3/uL (ref 0.7–3.1)
Lymphs: 22 %
MCH: 25.2 pg — ABNORMAL LOW (ref 26.6–33.0)
MCHC: 30.7 g/dL — ABNORMAL LOW (ref 31.5–35.7)
MCV: 82 fL (ref 79–97)
Monocytes Absolute: 0.7 10*3/uL (ref 0.1–0.9)
Monocytes: 9 %
Neutrophils Absolute: 5.3 10*3/uL (ref 1.4–7.0)
Neutrophils: 63 %
Platelets: 160 10*3/uL (ref 150–450)
RBC: 5.4 x10E6/uL (ref 4.14–5.80)
RDW: 13.2 % (ref 11.6–15.4)
WBC: 8.3 10*3/uL (ref 3.4–10.8)

## 2023-02-28 LAB — VITAMIN B12: Vitamin B-12: 717 pg/mL (ref 232–1245)

## 2023-02-28 LAB — COMPREHENSIVE METABOLIC PANEL
ALT: 59 [IU]/L — ABNORMAL HIGH (ref 0–44)
AST: 43 [IU]/L — ABNORMAL HIGH (ref 0–40)
Albumin: 4.5 g/dL (ref 3.9–4.9)
Alkaline Phosphatase: 110 [IU]/L (ref 44–121)
BUN/Creatinine Ratio: 12 (ref 10–24)
BUN: 13 mg/dL (ref 8–27)
Bilirubin Total: 0.3 mg/dL (ref 0.0–1.2)
CO2: 23 mmol/L (ref 20–29)
Calcium: 9.5 mg/dL (ref 8.6–10.2)
Chloride: 102 mmol/L (ref 96–106)
Creatinine, Ser: 1.09 mg/dL (ref 0.76–1.27)
Globulin, Total: 2.8 g/dL (ref 1.5–4.5)
Glucose: 157 mg/dL — ABNORMAL HIGH (ref 70–99)
Potassium: 4.8 mmol/L (ref 3.5–5.2)
Sodium: 139 mmol/L (ref 134–144)
Total Protein: 7.3 g/dL (ref 6.0–8.5)
eGFR: 74 mL/min/{1.73_m2} (ref >59)

## 2023-02-28 LAB — HEMOGLOBIN A1C
Est. average glucose Bld gHb Est-mCnc: 183 mg/dL
Hgb A1c MFr Bld: 8 % — ABNORMAL HIGH (ref 4.8–5.6)

## 2023-02-28 LAB — TSH: TSH: 5.33 u[IU]/mL — ABNORMAL HIGH (ref 0.450–4.500)

## 2023-03-14 DIAGNOSIS — Z1211 Encounter for screening for malignant neoplasm of colon: Secondary | ICD-10-CM | POA: Diagnosis not present

## 2023-03-15 LAB — FECAL OCCULT BLOOD, IMMUNOCHEMICAL: IFOBT: NEGATIVE

## 2023-03-21 LAB — COLOGUARD: COLOGUARD: POSITIVE — AB

## 2023-03-21 MED ORDER — LEVOTHYROXINE SODIUM 75 MCG PO TABS
75.0000 ug | ORAL_TABLET | Freq: Every day | ORAL | 1 refills | Status: DC
Start: 2023-03-21 — End: 2023-11-07

## 2023-03-21 MED ORDER — METFORMIN HCL ER 750 MG PO TB24: 1500.0000 mg | ORAL_TABLET | Freq: Every day | ORAL | 1 refills | Status: DC

## 2023-03-21 NOTE — Addendum Note (Signed)
Addended by: Knox Holdman, Huntley Dec E on: 03/21/2023 06:50 PM   Modules accepted: Orders

## 2023-05-12 ENCOUNTER — Encounter: Payer: Self-pay | Admitting: Nurse Practitioner

## 2023-06-19 ENCOUNTER — Encounter: Payer: Self-pay | Admitting: Pulmonary Disease

## 2023-07-11 ENCOUNTER — Encounter: Payer: Self-pay | Admitting: Nurse Practitioner

## 2023-08-29 ENCOUNTER — Ambulatory Visit: Payer: Medicare Other | Admitting: Nurse Practitioner

## 2023-09-01 ENCOUNTER — Ambulatory Visit: Admitting: Nurse Practitioner

## 2023-10-04 ENCOUNTER — Other Ambulatory Visit: Payer: Self-pay | Admitting: Nurse Practitioner

## 2023-10-04 DIAGNOSIS — I152 Hypertension secondary to endocrine disorders: Secondary | ICD-10-CM

## 2023-10-04 DIAGNOSIS — E1169 Type 2 diabetes mellitus with other specified complication: Secondary | ICD-10-CM

## 2023-10-04 DIAGNOSIS — N401 Enlarged prostate with lower urinary tract symptoms: Secondary | ICD-10-CM

## 2023-10-04 DIAGNOSIS — I700 Atherosclerosis of aorta: Secondary | ICD-10-CM

## 2023-10-04 DIAGNOSIS — E1165 Type 2 diabetes mellitus with hyperglycemia: Secondary | ICD-10-CM

## 2023-11-07 ENCOUNTER — Ambulatory Visit (INDEPENDENT_AMBULATORY_CARE_PROVIDER_SITE_OTHER): Admitting: Nurse Practitioner

## 2023-11-07 ENCOUNTER — Encounter: Payer: Self-pay | Admitting: Nurse Practitioner

## 2023-11-07 VITALS — BP 128/82 | HR 85 | Ht 67.5 in | Wt 180.4 lb

## 2023-11-07 DIAGNOSIS — E559 Vitamin D deficiency, unspecified: Secondary | ICD-10-CM

## 2023-11-07 DIAGNOSIS — I700 Atherosclerosis of aorta: Secondary | ICD-10-CM | POA: Diagnosis not present

## 2023-11-07 DIAGNOSIS — E039 Hypothyroidism, unspecified: Secondary | ICD-10-CM | POA: Diagnosis not present

## 2023-11-07 DIAGNOSIS — E1165 Type 2 diabetes mellitus with hyperglycemia: Secondary | ICD-10-CM | POA: Diagnosis not present

## 2023-11-07 DIAGNOSIS — E785 Hyperlipidemia, unspecified: Secondary | ICD-10-CM | POA: Diagnosis not present

## 2023-11-07 DIAGNOSIS — E1159 Type 2 diabetes mellitus with other circulatory complications: Secondary | ICD-10-CM

## 2023-11-07 DIAGNOSIS — M2550 Pain in unspecified joint: Secondary | ICD-10-CM

## 2023-11-07 DIAGNOSIS — Z794 Long term (current) use of insulin: Secondary | ICD-10-CM

## 2023-11-07 DIAGNOSIS — N401 Enlarged prostate with lower urinary tract symptoms: Secondary | ICD-10-CM

## 2023-11-07 DIAGNOSIS — R488 Other symbolic dysfunctions: Secondary | ICD-10-CM | POA: Insufficient documentation

## 2023-11-07 DIAGNOSIS — R109 Unspecified abdominal pain: Secondary | ICD-10-CM | POA: Diagnosis not present

## 2023-11-07 DIAGNOSIS — J4541 Moderate persistent asthma with (acute) exacerbation: Secondary | ICD-10-CM

## 2023-11-07 DIAGNOSIS — E1169 Type 2 diabetes mellitus with other specified complication: Secondary | ICD-10-CM | POA: Diagnosis not present

## 2023-11-07 DIAGNOSIS — Z0000 Encounter for general adult medical examination without abnormal findings: Secondary | ICD-10-CM

## 2023-11-07 DIAGNOSIS — I152 Hypertension secondary to endocrine disorders: Secondary | ICD-10-CM

## 2023-11-07 DIAGNOSIS — M2559 Pain in other specified joint: Secondary | ICD-10-CM

## 2023-11-07 HISTORY — DX: Pain in unspecified joint: M25.50

## 2023-11-07 MED ORDER — VITAMIN D (ERGOCALCIFEROL) 1.25 MG (50000 UNIT) PO CAPS: 50000.0000 [IU] | ORAL_CAPSULE | ORAL | 3 refills | Status: AC

## 2023-11-07 MED ORDER — METFORMIN HCL ER 750 MG PO TB24: 1500.0000 mg | ORAL_TABLET | Freq: Every day | ORAL | 3 refills | Status: DC

## 2023-11-07 MED ORDER — LEVOTHYROXINE SODIUM 75 MCG PO TABS: 75.0000 ug | ORAL_TABLET | Freq: Every day | ORAL | 3 refills | Status: DC

## 2023-11-07 MED ORDER — ASPIRIN 81 MG PO TBEC: 81.0000 mg | DELAYED_RELEASE_TABLET | Freq: Every day | ORAL | 3 refills | Status: AC

## 2023-11-07 MED ORDER — OMEPRAZOLE 40 MG PO CPDR
40.0000 mg | DELAYED_RELEASE_CAPSULE | Freq: Every day | ORAL | 3 refills | Status: AC
Start: 2023-11-07 — End: ?

## 2023-11-07 MED ORDER — MONTELUKAST SODIUM 10 MG PO TABS: 10.0000 mg | ORAL_TABLET | Freq: Every day | ORAL | 3 refills | Status: AC

## 2023-11-07 MED ORDER — TAMSULOSIN HCL 0.4 MG PO CAPS
0.4000 mg | ORAL_CAPSULE | Freq: Every day | ORAL | 3 refills | Status: AC
Start: 2023-11-07 — End: ?

## 2023-11-07 NOTE — Assessment & Plan Note (Signed)
 Chronic pain in fingers, hands, knees, and feet, exacerbated by weather changes. Symptoms suggestive of osteoarthritis. - Recommend avoiding NSAIDs due to reflux history and risk of worsening symptoms.  - Topical diclofenac (Voltaren) recommended for daily use.

## 2023-11-07 NOTE — Assessment & Plan Note (Signed)
 Nocturia with frequent urination at night and slow urine stream, despite use of tamsulosin  for management. Will recheck prostate levels.  - Check prostate levels.

## 2023-11-07 NOTE — Assessment & Plan Note (Signed)
 Recent onset of pills getting stuck in the throat, with no history of similar. No associated coughing or pain. Unclear if this is a single event or something that will need further work-up.  - Monitor for this happening again and be sure to report if this is becoming frequent.  - Consider taking pills with pudding or applesauce to help them go down the throat better

## 2023-11-07 NOTE — Assessment & Plan Note (Signed)
 Currently managed with metformin , diet, and exercise. No alarm symptoms. In the setting of htn, hld, and DM, recommend tight blood sugar control, frequent monitoring of A1c, kidney function, liver function, electrolytes, and lipids for optimal management. Annual eye exam recommended. Urine microalbumin obtained. Foot exam normal.

## 2023-11-07 NOTE — Assessment & Plan Note (Signed)
 Chronic. Labs for monitoring today. No symptoms currently. Will make changes as necessary based on labs.

## 2023-11-07 NOTE — Assessment & Plan Note (Signed)

## 2023-11-07 NOTE — Assessment & Plan Note (Signed)
 Continue with PRN use of albuterol  and daily use of singulair . Breztri and Trellegy helpful in the past but are cost prohibitive. No alarm symptoms at this time.

## 2023-11-07 NOTE — Assessment & Plan Note (Signed)
 Currently managed with amlodipine -atorvastatin . Has been increasing exercise with walking daily. Encouraged him to continue this regimen.

## 2023-11-07 NOTE — Assessment & Plan Note (Signed)
 Blood pressure is well-controlled with current medication regimen. - Send in refills for amlodipine -atorvastatin 

## 2023-11-07 NOTE — Progress Notes (Signed)
 11/07/2023   Vitals:  BP 128/82   Pulse 85   Ht 5' 7.5 (1.715 m)   Wt 180 lb 6.4 oz (81.8 kg)   BMI 27.84 kg/m   Body mass index is 27.84 kg/m. Paul Edwards is a 68 y.o. male who presents for Subsequent Medicare Annual Wellness Exam  Care Team Members: Current Providers as of 11/07/2023 PCP: Aniah Pauli, Camie BRAVO, NP Care Team Provider: Oris Camie BRAVO, NP Care Team Provider: Annella Donnice SAUNDERS, MD Encounter Provider: Oris Camie BRAVO, NP, starting on Tue Nov 07, 2023 12:00 AM Referring Provider: Oris Camie BRAVO, NP, starting on Tue Nov 07, 2023 12:00 AM Attending Provider: Oris Camie BRAVO, NP, starting on Thu Jul 13, 2023  1:02 PM (Active)   Method of visit:  in person In the event virtual visit conducted, the patient consented to a virtual visit. Patient consented to have virtual visit and was identified by two identifiers.  Encounter participants: Patient: Paul Edwards - located [facility] Patient Visit Location: In Office Nurse/Provider: Camie CHARLENA Oris - located Virtual Visit Location Provider: Office/Clinic Others (if applicable): patient only  History of Present Illness Paul Edwards is a 68 year old male who presents for his annual physical exam.   He experiences pain in his fingers, which worsens with changes in weather, particularly when it rains or gets cold. The pain is sometimes severe, though it has been better today compared to the last two days. He also has knee pain, which contributes to fatigue and reluctance to walk. Despite this, he attempts to stay active.  He had difficulty swallowing his pills this morning, with them feeling like they were getting stuck in his throat. This was not a problem before. He did not experience coughing when the pills got stuck, but he did have some throat pain.   He experiences frequent urination at night, sometimes up to four times, with a slow and difficult urine flow.   He also reports intermittent swelling in his feet, which fluctuates in size and  sometimes resolves spontaneously.  He walks about twenty minutes daily and has recently purchased a walking device for home use. He drinks little water, sometimes only two to three glasses a day, and acknowledges that he is 'lazy to drink it.' No current throat pain, although he experienced it this morning when a pill got stuck. He reports occasional chest pain or discomfort in the belly area, which is not severe and occurs every once in a while.  Review of Systems:  Neuro: Denies difficulty remembering daily tasks, people, or places.  Ear: Denies difficulty hearing or need to increase volume on television or telephone to hear Eye: Denies visual changes, difficulty reading normal print, or visual field deficits. Cardiac: Denies chest pain, palpitations, dizziness, shortness of breath, pain in lower extremities, or night time waking with shortness of breath. Lung: Denies shortness of breath, difficulty breathing, chronic cough, or dizziness.  GI: Denies changes in bowel habits, blood in stool, difficulty passing stool, decreased intake of food or drink, nausea, or vomiting.  GU: Denies changes in urinary habits, dark urine, malodorous urine, increased or decreased urination, or urinary incontinence.  MSK: Denies weakness in extremities, difficulty walking, difficulty grasping, or new MSK pain.  Skin: Denies changes to the skin, fragile skin, or increased bruising.  Constitution: Denies fatigue, weakness, or confusion.   Patient rating of health: same as this time last year  Clinical Intake: Pre-visit preparation completed: Yes  Pain : No/denies pain  BMI - recorded: 27.28 Nutritional Status: BMI 25 -29 Overweight Nutritional Risks: None Diabetes: Yes CBG done?: No Did pt. bring in CBG monitor from home?: No  Activities of Daily Living: Independent Ambulation: Independent Medication Administration: Independent Home Management: Independent  Barriers to Care Management &  Learning: Language  Do you feel unsafe in your current relationship?: No Do you feel physically threatened by others?: No Anyone hurting you at home, work, or school?: No Unable to ask?: No Information provided on Community resources: No  How often do you need to have someone help you when you read instructions, pamphlets, or other written materials from your doctor or pharmacy?: 4 - Often  Interpreter Needed?: No (Language unavailable.)        11/07/2023    8:53 AM 01/27/2019    9:19 AM 05/09/2014   10:50 AM  Advanced Directives  Does Patient Have a Medical Advance Directive? No No No   Would patient like information on creating a medical advance directive? No - Patient declined No - Patient declined      Data saved with a previous flowsheet row definition    Social Determinants of Health SDOH Screenings   Food Insecurity: No Food Insecurity (11/24/2022)  Housing: Low Risk  (11/24/2022)  Transportation Needs: No Transportation Needs (11/24/2022)  Utilities: Not At Risk (11/24/2022)  Depression (PHQ2-9): Low Risk  (11/07/2023)  Financial Resource Strain: Low Risk  (11/24/2022)  Physical Activity: Insufficiently Active (11/24/2022)  Social Connections: Moderately Integrated (11/24/2022)  Stress: No Stress Concern Present (11/24/2022)  Tobacco Use: Medium Risk (11/07/2023)  Health Literacy: Low Risk  (11/14/2019)   Received from [facility]     Functional Status Survey: Is the patient deaf or have difficulty hearing?: No Does the patient have difficulty seeing, even when wearing glasses/contacts?: No Does the patient have difficulty concentrating, remembering, or making decisions?:  (memory getting a little worse per pt.) Does the patient have difficulty walking or climbing stairs?: Yes (knees hurt) Does the patient have difficulty dressing or bathing?: No Does the patient have difficulty doing errands alone such as visiting a doctor's office or shopping?: No   Annual Goal:   Goals      Patient Stated     Drink more water. Recommend: Work to get in at least 64 ounces of water every day.          Fall Risk    11/07/2023    8:51 AM 02/27/2023    3:32 PM 11/24/2022    8:40 AM 08/16/2021    2:48 PM 02/12/2021    9:18 AM  Fall Risk   Falls in the past year? 0 0 0 0 0  Number falls in past yr: 0 0 0 0 0  Injury with Fall? 0 0 0 0 0  Risk for fall due to : No Fall Risks No Fall Risks No Fall Risks No Fall Risks No Fall Risks  Follow up Falls evaluation completed Falls evaluation completed Falls evaluation completed Education provided;Falls evaluation completed  Falls evaluation completed      Data saved with a previous flowsheet row definition   Medicare Risk  Medicare Risk at Home - 11/07/23 0857     Any stairs in or around the home? Yes    If so, are there any without handrails? No    Home free of loose throw rugs in walkways, pet beds, electrical cords, etc? Yes    Adequate lighting in your home to reduce risk of falls? Yes  Life alert? No    Use of a cane, walker or w/c? No    Grab bars in the bathroom? No    Shower chair or bench in shower? No    Elevated toilet seat or a handicapped toilet? No           Cognitive Function Normal: Yes Exam Completed:         Mini-Cog - 11/07/23 0852     Normal clock drawing test? yes    How many words correct? 2          Depression Screening    11/07/2023    8:51 AM 02/27/2023    3:32 PM 11/24/2022    8:40 AM 08/16/2021    2:48 PM 07/31/2020   10:54 AM  Depression screen PHQ 2/9  Decreased Interest 0 0 0 0 0  Down, Depressed, Hopeless 0 0 0 0 0  PHQ - 2 Score 0 0 0 0 0  Altered sleeping     0  Tired, decreased energy     0  Change in appetite     0  Feeling bad or failure about yourself      0  Trouble concentrating     0  Moving slowly or fidgety/restless     0  Suicidal thoughts     0  PHQ-9 Score     0     Activities of Daily Living    11/07/2023    9:02 AM 11/07/2023    8:54  AM  In your present state of health, do you have any difficulty performing the following activities:  Hearing?  0  Vision?  0  Difficulty concentrating or making decisions? -- 1  Comment memory getting a little worse per pt.   Walking or climbing stairs?  1  Comment  knees hurt  Dressing or bathing?  0  Doing errands, shopping?  0  Preparing Food and eating ?  N  Using the Toilet?  N  In the past six months, have you accidently leaked urine?  N  Do you have problems with loss of bowel control?  N  Managing your Medications?  N  Managing your Finances?  N  Housekeeping or managing your Housekeeping?  N    Tobacco Social History   Tobacco Use  Smoking Status Former   Types: Cigarettes  Smokeless Tobacco Never     Counseling given: Not Answered   Hospitalizations in the Past Year: none  ED Visits in the Past Year: No  Surgeries in the Past Year: No   History    Medication List Current Meds  Medication Sig   albuterol  (PROVENTIL ) (2.5 MG/3ML) 0.083% nebulizer solution Take 3 mLs (2.5 mg total) by nebulization every 4 (four) hours as needed for up to 60 doses for wheezing or shortness of breath.   AMBULATORY NON FORMULARY MEDICATION Compression stockings 15-19mmHg. To be worn for 12 hours then removed for 12 hours daily. For edema, diabetes   amlodipine -atorvastatin  (CADUET ) 10-20 MG tablet TAKE 1 TABLET BY MOUTH DAILY   Blood Glucose Monitoring Suppl (ACCU-CHEK GUIDE ME) w/Device KIT 1 m by Does not apply route as directed. Pt. Also needs accu chek guide test strips and lancets   glucose blood test strip Use up to 4 times per day as directed for Contour Next EZ glucometer. Disp: 100. Refill x99   ondansetron  (ZOFRAN -ODT) 4 MG disintegrating tablet Take 1 tablet (4 mg total) by mouth every 8 (eight) hours as needed for  nausea or vomiting.   OneTouch Delica Lancets 33G MISC Use as directed   traZODone  (DESYREL ) 100 MG tablet Take 1 tablet (100 mg total) by mouth at  bedtime as needed for sleep.   [DISCONTINUED] levothyroxine  (SYNTHROID ) 75 MCG tablet Take 1 tablet (75 mcg total) by mouth daily before breakfast. For thyroid    [DISCONTINUED] metFORMIN  (GLUCOPHAGE -XR) 750 MG 24 hr tablet Take 2 tablets (1,500 mg total) by mouth daily with breakfast.   [DISCONTINUED] montelukast  (SINGULAIR ) 10 MG tablet Take 1 tablet (10 mg total) by mouth at bedtime. For asthma/allergies   [DISCONTINUED] omeprazole  (PRILOSEC) 40 MG capsule Take 1 capsule (40 mg total) by mouth daily. For acid reflux/Stomach pain   [DISCONTINUED] tamsulosin  (FLOMAX ) 0.4 MG CAPS capsule Take 1 capsule (0.4 mg total) by mouth daily. For prostate   [DISCONTINUED] Vitamin D , Ergocalciferol , (DRISDOL ) 1.25 MG (50000 UNIT) CAPS capsule Take 1 capsule (50,000 Units total) by mouth every 7 (seven) days. For bones and vitamin D      Immunizations Immunization History  Administered Date(s) Administered   Fluad Quad(high Dose 65+) 02/12/2021   Fluad Trivalent(High Dose 65+) 02/27/2023   Influenza, Quadrivalent, Recombinant, Inj, Pf 02/15/2022   Influenza-Unspecified 02/10/2016   PNEUMOCOCCAL CONJUGATE-20 02/12/2021   Pfizer(Comirnaty)Fall Seasonal Vaccine 12 years and older 02/27/2023   Tdap 01/04/2017, 10/17/2022   Zoster Recombinant(Shingrix ) 11/24/2022     Screening Tests Health Maintenance  Topic Date Due   OPHTHALMOLOGY EXAM  Never done   Zoster Vaccines- Shingrix  (2 of 2) 01/19/2023   HEMOGLOBIN A1C  08/27/2023   Diabetic kidney evaluation - Urine ACR  11/24/2023   COVID-19 Vaccine (2 - 2024-25 season) 11/23/2023 (Originally 08/27/2023)   INFLUENZA VACCINE  11/10/2023   Diabetic kidney evaluation - eGFR measurement  02/27/2024   FOOT EXAM  02/27/2024   Medicare Annual Wellness (AWV)  11/06/2024   Colonoscopy  06/18/2028   DTaP/Tdap/Td (3 - Td or Tdap) 10/16/2032   Pneumococcal Vaccine: 50+ Years  Completed   Hepatitis C Screening  Completed   Hepatitis B Vaccines  Aged Out   HPV  VACCINES  Aged Out   Meningococcal B Vaccine  Aged Out   Fecal DNA (Cologuard)  Discontinued    Health Maintenance Screenings  Health Maintenance Topics with due status: Overdue     Topic Date Due   OPHTHALMOLOGY EXAM Never done   Zoster Vaccines- Shingrix  01/19/2023   HEMOGLOBIN A1C 08/27/2023   Health Maintenance Topics with due status: Due Soon     Topic Date Due   Diabetic kidney evaluation - Urine ACR 11/24/2023     Past Medical History:  Diagnosis Date   Abdominal pain 02/09/2018   Allergy    Asthma    Body mass index 26.0-26.9, adult 07/31/2020   Colon cancer screening 06/25/2020   Cough 05/12/2008   Qualifier: Diagnosis of  By: Corrie MD, Francis HERO Reactive airway disease likely   Encounter to establish care 07/31/2020   Epigastric pain 06/25/2020   GERD (gastroesophageal reflux disease)    Helicobacter pylori infection 06/25/2020   Hypertension    Increased frequency of urination 02/22/2018   Laboratory tests ordered as part of a complete physical exam (CPE) 07/31/2020   Left foot pain 08/16/2021   Localized edema 08/16/2021   Lower urinary tract symptoms (LUTS) 02/22/2018   Mixed hyperlipidemia 09/04/2020   Nausea and vomiting 06/25/2020   Piriformis syndrome of left side 07/31/2020   Sciatica of left side 11/13/2020   Subacute cough 05/12/2008   Qualifier: Diagnosis of  By: Corrie MD, Francis HERO  Reactive airway disease likely   Thyroid  disease    Past Surgical History:  Procedure Laterality Date   LUMBAR DISC ARTHROPLASTY     [place]   SPINE SURGERY     Family History  Problem Relation Age of Onset   Heart disease Sister 18       AMI one sister   Social History   Socioeconomic History   Marital status: Married    Spouse name: Not on file   Number of children: 3   Years of education: Not on file   Highest education level: Not on file  Occupational History   Occupation: Location manager  Tobacco Use   Smoking status: Former    Types:  Cigarettes   Smokeless tobacco: Never  Vaping Use   Vaping status: Never Used  Substance and Sexual Activity   Alcohol use: Yes    Alcohol/week: 0.0 standard drinks of alcohol    Comment: occ   Drug use: No   Sexual activity: Yes  Other Topics Concern   Not on file  Social History Narrative   Marital status: married x 7 years; from [place]; moved to [place]  1987      Children: 3 children; no grandchildren      Lives: with wife, oldest daughter, youngest daughter      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 [employer]      Tobacco: quit smoking 2008; smoked for 20 years      Alcohol: none      Exercise: walking some      Seatbelt: 100%   Social Drivers of Corporate investment banker Strain: Low Risk  (11/24/2022)   Overall Financial Resource Strain (CARDIA)    Difficulty of Paying Living Expenses: Not very hard  Food Insecurity: No Food Insecurity (11/24/2022)   Hunger Vital Sign    Worried About Running Out of Food in the Last Year: Never true    Ran Out of Food in the Last Year: Never true  Transportation Needs: No Transportation Needs (11/24/2022)   PRAPARE - Administrator, Civil Service (Medical): No    Lack of Transportation (Non-Medical): No  Physical Activity: Insufficiently Active (11/24/2022)   Exercise Vital Sign    Days of Exercise per Week: 4 days    Minutes of Exercise per Session: 30 min  Stress: No Stress Concern Present (11/24/2022)   [employer] of Occupational Health - Occupational Stress Questionnaire    Feeling of Stress : Not at all  Social Connections: Moderately Integrated (11/24/2022)   Social Connection and Isolation Panel    Frequency of Communication with Friends and Family: More than three times a week    Frequency of Social Gatherings with Friends and Family: Once a week    Attends Religious Services: More than 4 times per year    Active Member of [employer] or Organizations: No    Attends Banker Meetings: Never    Marital  Status: Married    Outpatient Encounter Medications as of 11/07/2023  Medication Sig   albuterol  (PROVENTIL ) (2.5 MG/3ML) 0.083% nebulizer solution Take 3 mLs (2.5 mg total) by nebulization every 4 (four) hours as needed for up to 60 doses for wheezing or shortness of breath.   AMBULATORY NON FORMULARY MEDICATION Compression stockings 15-31mmHg. To be worn for 12 hours then removed for 12 hours daily. For edema, diabetes   amlodipine -atorvastatin  (CADUET ) 10-20 MG tablet TAKE 1 TABLET BY MOUTH DAILY  Blood Glucose Monitoring Suppl (ACCU-CHEK GUIDE ME) w/Device KIT 1 m by Does not apply route as directed. Pt. Also needs accu chek guide test strips and lancets   glucose blood test strip Use up to 4 times per day as directed for Contour Next EZ glucometer. Disp: 100. Refill x99   ondansetron  (ZOFRAN -ODT) 4 MG disintegrating tablet Take 1 tablet (4 mg total) by mouth every 8 (eight) hours as needed for nausea or vomiting.   OneTouch Delica Lancets 33G MISC Use as directed   traZODone  (DESYREL ) 100 MG tablet Take 1 tablet (100 mg total) by mouth at bedtime as needed for sleep.   [DISCONTINUED] levothyroxine  (SYNTHROID ) 75 MCG tablet Take 1 tablet (75 mcg total) by mouth daily before breakfast. For thyroid    [DISCONTINUED] metFORMIN  (GLUCOPHAGE -XR) 750 MG 24 hr tablet Take 2 tablets (1,500 mg total) by mouth daily with breakfast.   [DISCONTINUED] montelukast  (SINGULAIR ) 10 MG tablet Take 1 tablet (10 mg total) by mouth at bedtime. For asthma/allergies   [DISCONTINUED] omeprazole  (PRILOSEC) 40 MG capsule Take 1 capsule (40 mg total) by mouth daily. For acid reflux/Stomach pain   [DISCONTINUED] tamsulosin  (FLOMAX ) 0.4 MG CAPS capsule Take 1 capsule (0.4 mg total) by mouth daily. For prostate   [DISCONTINUED] Vitamin D , Ergocalciferol , (DRISDOL ) 1.25 MG (50000 UNIT) CAPS capsule Take 1 capsule (50,000 Units total) by mouth every 7 (seven) days. For bones and vitamin D    aspirin  EC 81 MG tablet Take 1  tablet (81 mg total) by mouth daily. Swallow whole.   gabapentin  (NEURONTIN ) 300 MG capsule Take 1 capsule (300 mg total) by mouth at bedtime. For leg pain and burning (Patient not taking: Reported on 11/07/2023)   levothyroxine  (SYNTHROID ) 75 MCG tablet Take 1 tablet (75 mcg total) by mouth daily before breakfast. For thyroid    metFORMIN  (GLUCOPHAGE -XR) 750 MG 24 hr tablet Take 2 tablets (1,500 mg total) by mouth daily with breakfast.   montelukast  (SINGULAIR ) 10 MG tablet Take 1 tablet (10 mg total) by mouth at bedtime. For asthma/allergies   omeprazole  (PRILOSEC) 40 MG capsule Take 1 capsule (40 mg total) by mouth daily. For acid reflux/Stomach pain   tamsulosin  (FLOMAX ) 0.4 MG CAPS capsule Take 1 capsule (0.4 mg total) by mouth daily. For prostate   Vitamin D , Ergocalciferol , (DRISDOL ) 1.25 MG (50000 UNIT) CAPS capsule Take 1 capsule (50,000 Units total) by mouth every 7 (seven) days. For bones and vitamin D    [DISCONTINUED] albuterol  (VENTOLIN  HFA) 108 (90 Base) MCG/ACT inhaler Inhale 2 puffs into the lungs every 4 (four) hours as needed for wheezing. (Patient not taking: Reported on 11/07/2023)   [DISCONTINUED] aspirin  EC 81 MG tablet Take 1 tablet (81 mg total) by mouth daily. Swallow whole. (Patient not taking: Reported on 11/07/2023)   [DISCONTINUED] Fluticasone-Umeclidin-Vilant (TRELEGY ELLIPTA ) 200-62.5-25 MCG/ACT AEPB INHALE 1 PUFF INTO THE LUNGS DAILY (Patient not taking: Reported on 11/07/2023)   No facility-administered encounter medications on file as of 11/07/2023.    Physical Exam: Yes-completed Physical Exam Vitals and nursing note reviewed.  Constitutional:      General: He is not in acute distress.    Appearance: Normal appearance.  HENT:     Head: Normocephalic.     Right Ear: Tympanic membrane normal.     Left Ear: Tympanic membrane normal.     Nose: Nose normal.     Mouth/Throat:     Mouth: Mucous membranes are moist.     Pharynx: Oropharynx is clear.  Eyes:  Conjunctiva/sclera: Conjunctivae normal.     Pupils: Pupils are equal, round, and reactive to light.  Neck:     Vascular: No carotid bruit.  Cardiovascular:     Rate and Rhythm: Normal rate and regular rhythm.     Pulses: Normal pulses.     Heart sounds: Normal heart sounds.  Pulmonary:     Effort: Pulmonary effort is normal.     Breath sounds: Normal breath sounds.  Abdominal:     General: Bowel sounds are normal. There is no distension.     Palpations: Abdomen is soft.     Tenderness: There is no abdominal tenderness. There is no right CVA tenderness, left CVA tenderness or guarding.  Musculoskeletal:        General: Tenderness present. Normal range of motion.     Cervical back: Normal range of motion. No tenderness.     Comments: Mild edema noted in ankles.  Tenderness in hands and knees bilaterally. No erythema, edema, warmth present.   Lymphadenopathy:     Cervical: No cervical adenopathy.  Skin:    General: Skin is warm and dry.     Capillary Refill: Capillary refill takes less than 2 seconds.  Neurological:     General: No focal deficit present.     Mental Status: He is alert and oriented to person, place, and time.     Sensory: No sensory deficit.     Motor: No weakness.  Psychiatric:        Mood and Affect: Mood normal.        Behavior: Behavior normal.     PLAN  Exercise Activities and Dietary Recommendations - choose a type of activity I enjoy such as biking, gardening, team sports, walking - keep track of how long I exercise - keep track of how often I exercise Carb modified diet  Fall Prevention - always use handrails on the stairs - always wear shoes or slippers with non-slip sole - get at least 10 minutes of activity every day - keep a flashlight by the bed - make an emergency alert plan in case I fall  Orders Placed This Encounter  Procedures   CBC with Differential/Platelet   CMP14+EGFR   Hemoglobin A1c   Lipid panel   TSH + free T4    Microalbumin / creatinine urine ratio   PSA Total (Reflex To Free)   VITAMIN D  25 Hydroxy (Vit-D Deficiency, Fractures)    Release to patient:   Immediate     I have personally reviewed and noted the following in the patient's chart:   Medical and social history Use of alcohol, tobacco or illicit drugs  Current medications and supplements Functional ability and status Nutritional status Physical activity Advanced directives List of other physicians Hospitalizations, surgeries, and ER visits in previous 12 months Vitals Screenings to include cognitive, depression, and falls Referrals and appointments  In addition, I have reviewed and discussed with patient certain preventive protocols, quality metrics, and best practice recommendations. A written personalized care plan for preventive services as well as general preventive health recommendations were provided to patient.   Camie CHARLENA Doing, NP  11/07/2023

## 2023-11-07 NOTE — Assessment & Plan Note (Signed)
 Currently on statin therapy and working to keep blood sugars under control. Continue to monitor. Labs pending.

## 2023-11-07 NOTE — Patient Instructions (Addendum)
 There is a gel over the counter that you can use on your hands, knees, and feet to help with the pain. It is called DICLOFENAC (brand name: Voltaren Gel). This can help with the pain and not cause side effects that normal medication can cause (like belly pain). I believe this pain is arthritis. Arthritis is inflammation and pain from normal wear and tear on the joints in the body. It can be worse when the weather changes or if you are using the area more. The gel can help with both pain and inflammation.   You can put your pills into pudding or applesauce to help them go down easier when you swallow. If you feel like these are getting stuck in your throat on a regular basis, let me know and we can have the stomach doctor (GI) look.  I have sent in refills of the following medications: Vitamin D  Tamsulosin  (for slow urine) Omeprazole  (for acid reflux, stomach pain) Montelukast  (for asthma, allergies) Metformin  (for blood sugar) Levothyroxine  (thyroid ) Aspirin  (for heart protection)  I will let you know what your lab work shows and if we need to change anything.   Keep working on getting exercise daily- walking 10-20 minutes a day is great!  Work to get at least 8 glasses of water in a day. This is very important, especially when you are walking.   I would like you to see an eye doctor once a year to make sure your eyes are OK. This is important with diabetes.

## 2023-11-08 LAB — CBC WITH DIFFERENTIAL/PLATELET
Basophils Absolute: 0.1 x10E3/uL (ref 0.0–0.2)
Basos: 1 %
EOS (ABSOLUTE): 0.3 x10E3/uL (ref 0.0–0.4)
Eos: 4 %
Hematocrit: 42.8 % (ref 37.5–51.0)
Hemoglobin: 13.6 g/dL (ref 13.0–17.7)
Immature Grans (Abs): 0 x10E3/uL (ref 0.0–0.1)
Immature Granulocytes: 0 %
Lymphocytes Absolute: 1.8 x10E3/uL (ref 0.7–3.1)
Lymphs: 26 %
MCH: 26.8 pg (ref 26.6–33.0)
MCHC: 31.8 g/dL (ref 31.5–35.7)
MCV: 84 fL (ref 79–97)
Monocytes Absolute: 0.6 x10E3/uL (ref 0.1–0.9)
Monocytes: 9 %
Neutrophils Absolute: 4.3 x10E3/uL (ref 1.4–7.0)
Neutrophils: 60 %
Platelets: 170 x10E3/uL (ref 150–450)
RBC: 5.07 x10E6/uL (ref 4.14–5.80)
RDW: 12.7 % (ref 11.6–15.4)
WBC: 7 x10E3/uL (ref 3.4–10.8)

## 2023-11-08 LAB — CMP14+EGFR
ALT: 69 IU/L — ABNORMAL HIGH (ref 0–44)
AST: 62 IU/L — ABNORMAL HIGH (ref 0–40)
Albumin: 4.6 g/dL (ref 3.9–4.9)
Alkaline Phosphatase: 109 IU/L (ref 44–121)
BUN/Creatinine Ratio: 16 (ref 10–24)
BUN: 15 mg/dL (ref 8–27)
Bilirubin Total: 0.4 mg/dL (ref 0.0–1.2)
CO2: 24 mmol/L (ref 20–29)
Calcium: 9.7 mg/dL (ref 8.6–10.2)
Chloride: 103 mmol/L (ref 96–106)
Creatinine, Ser: 0.95 mg/dL (ref 0.76–1.27)
Globulin, Total: 2.9 g/dL (ref 1.5–4.5)
Glucose: 142 mg/dL — ABNORMAL HIGH (ref 70–99)
Potassium: 4.2 mmol/L (ref 3.5–5.2)
Sodium: 141 mmol/L (ref 134–144)
Total Protein: 7.5 g/dL (ref 6.0–8.5)
eGFR: 87 mL/min/1.73 (ref >59)

## 2023-11-08 LAB — HEMOGLOBIN A1C
Est. average glucose Bld gHb Est-mCnc: 183 mg/dL
Hgb A1c MFr Bld: 8 % — ABNORMAL HIGH (ref 4.8–5.6)

## 2023-11-08 LAB — LIPID PANEL
Chol/HDL Ratio: 2.8 ratio (ref 0.0–5.0)
Cholesterol, Total: 123 mg/dL (ref 100–199)
HDL: 44 mg/dL (ref >39)
LDL Chol Calc (NIH): 58 mg/dL (ref 0–99)
Triglycerides: 119 mg/dL (ref 0–149)
VLDL Cholesterol Cal: 21 mg/dL (ref 5–40)

## 2023-11-08 LAB — TSH+FREE T4
Free T4: 1.1 ng/dL (ref 0.82–1.77)
TSH: 5.33 u[IU]/mL — AB (ref 0.450–4.500)

## 2023-11-08 LAB — MICROALBUMIN / CREATININE URINE RATIO
Creatinine, Urine: 129.5 mg/dL
Microalb/Creat Ratio: 15 mg/g{creat} (ref 0–29)
Microalbumin, Urine: 19.5 ug/mL

## 2023-11-08 LAB — FPSA% REFLEX
% FREE PSA: 13.3 %
PSA, FREE: 1.09 ng/mL

## 2023-11-08 LAB — PSA TOTAL (REFLEX TO FREE): Prostate Specific Ag, Serum: 8.2 ng/mL — AB (ref 0.0–4.0)

## 2023-11-08 LAB — VITAMIN D 25 HYDROXY (VIT D DEFICIENCY, FRACTURES): Vit D, 25-Hydroxy: 22.6 ng/mL — AB (ref 30.0–100.0)

## 2023-11-14 ENCOUNTER — Ambulatory Visit: Payer: Self-pay | Admitting: Nurse Practitioner

## 2023-11-14 DIAGNOSIS — N281 Cyst of kidney, acquired: Secondary | ICD-10-CM | POA: Insufficient documentation

## 2023-11-14 DIAGNOSIS — I771 Stricture of artery: Secondary | ICD-10-CM | POA: Insufficient documentation

## 2023-11-14 DIAGNOSIS — E559 Vitamin D deficiency, unspecified: Secondary | ICD-10-CM | POA: Insufficient documentation

## 2023-11-14 DIAGNOSIS — E1165 Type 2 diabetes mellitus with hyperglycemia: Secondary | ICD-10-CM

## 2023-11-14 DIAGNOSIS — K760 Fatty (change of) liver, not elsewhere classified: Secondary | ICD-10-CM | POA: Insufficient documentation

## 2023-11-14 DIAGNOSIS — R748 Abnormal levels of other serum enzymes: Secondary | ICD-10-CM | POA: Insufficient documentation

## 2023-11-14 DIAGNOSIS — E039 Hypothyroidism, unspecified: Secondary | ICD-10-CM

## 2023-11-14 DIAGNOSIS — R918 Other nonspecific abnormal finding of lung field: Secondary | ICD-10-CM | POA: Insufficient documentation

## 2023-11-14 DIAGNOSIS — N400 Enlarged prostate without lower urinary tract symptoms: Secondary | ICD-10-CM | POA: Insufficient documentation

## 2023-11-14 MED ORDER — INSULIN GLARGINE (1 UNIT DIAL) 300 UNIT/ML ~~LOC~~ SOPN: 10.0000 [IU] | PEN_INJECTOR | Freq: Every day | SUBCUTANEOUS | 0 refills | Status: DC

## 2023-11-14 MED ORDER — PEN NEEDLES 31G X 5 MM MISC: 1.0000 | Freq: Every day | 3 refills | Status: AC

## 2023-12-31 NOTE — Progress Notes (Incomplete)
 Chief Complaint: Elevated PSA  History of Present Illness:  Paul Edwards is a 68 y.o. male who is seen in consultation from Early, Camie BRAVO, NP for evaluation of elevated PSA.  Prior PSA data: 12/2016--3.9 02/2021--8.0 (12% free) 11/2022--7.6 (percent free 14) 10/2023--8.2  That he knows of, there is no family history of prostate cancer  He also has bothersome lower urinary tract symptomatology.  IPSS 27/4.  He is on tamsulosin .  He does not think its helped that much.  Looks like he was started on this medicine 2 to 3 months ago.  He is diabetic.  It looks like he should be on insulin  but is not currently injecting.  His A1c's are above 8.   Past Medical History:  Past Medical History:  Diagnosis Date   Abdominal pain 02/09/2018   Allergy    Asthma    Body mass index 26.0-26.9, adult 07/31/2020   Colon cancer screening 06/25/2020   Cough 05/12/2008   Qualifier: Diagnosis of  By: Corrie MD, Francis HERO Reactive airway disease likely   Encounter to establish care 07/31/2020   Epigastric pain 06/25/2020   GERD (gastroesophageal reflux disease)    Helicobacter pylori infection 06/25/2020   Hypertension    Increased frequency of urination 02/22/2018   Laboratory tests ordered as part of a complete physical exam (CPE) 07/31/2020   Left foot pain 08/16/2021   Localized edema 08/16/2021   Lower urinary tract symptoms (LUTS) 02/22/2018   Mixed hyperlipidemia 09/04/2020   Nausea and vomiting 06/25/2020   Piriformis syndrome of left side 07/31/2020   Sciatica of left side 11/13/2020   Subacute cough 05/12/2008   Qualifier: Diagnosis of   By: Corrie MD, Francis HERO  Reactive airway disease likely   Thyroid  disease     Past Surgical History:  Past Surgical History:  Procedure Laterality Date   LUMBAR DISC ARTHROPLASTY     [place]   SPINE SURGERY      Allergies:  Not on File  Family History:  Family History  Problem Relation Age of Onset   Heart disease Sister 12       AMI  one sister    Social History:  Social History   Tobacco Use   Smoking status: Former    Types: Cigarettes   Smokeless tobacco: Never  Vaping Use   Vaping status: Never Used  Substance Use Topics   Alcohol use: Yes    Alcohol/week: 0.0 standard drinks of alcohol    Comment: occ   Drug use: No    Review of symptoms:  Constitutional:  Negative for unexplained weight loss, night sweats, fever, chills ENT:  Negative for nose bleeds, sinus pain, painful swallowing CV:  Negative for chest pain, shortness of breath, exercise intolerance, palpitations, loss of consciousness Resp:  Negative for cough, wheezing, shortness of breath GI:  Negative for nausea, vomiting, diarrhea, bloody stools GU:  Positives noted in HPI; otherwise negative for gross hematuria, dysuria, urinary incontinence Neuro:  Negative for seizures, poor balance, limb weakness, slurred speech Psych:  Negative for lack of energy, depression, anxiety Endocrine:  Negative for polydipsia, polyuria, symptoms of hypoglycemia (dizziness, hunger, sweating) Hematologic:  Negative for anemia, purpura, petechia, prolonged or excessive bleeding, use of anticoagulants  Allergic:  Negative for difficulty breathing or choking as a result of exposure to anything; no shellfish allergy; no allergic response (rash/itch) to materials, foods  Physical exam: There were no vitals taken for this visit. GENERAL APPEARANCE:  Well appearing, well developed, well  nourished, NAD HEENT: Atraumatic, Normocephalic. NECK: Normal appearance LUNGS: Normal inspiratory and expiratory excursion HEART: Regular Rate ABDOMEN: No inguinal hernias. GU: Phallus normal, no lesions. Scrotal skin normal. Testicles/epididymal structures normal. Meatus normal. Normal anal sphincter tone, prostate 80 mL, minimal asymmetry, slight firmness of left lobe, minimally suspicious. EXTREMITIES: Moves all extremities well.  Without clubbing, cyanosis, or edema. NEUROLOGIC:   Alert and oriented x 3, normal gait, CN II-XII grossly intact.  MENTAL STATUS:  Appropriate. SKIN:  Warm, dry and intact.    Results:  I have reviewed referring/prior physicians notes  I have reviewed urinalysis  I have reviewed PSA results  I have reviewed prior imaging--CT images from 2020 reviewed.  Estimated prostate volume 60 mL.  Large intra vesicle median lobe.  IPSS reviewed  Residual urine volume 25 mL    Assessment: 1.  BPH with symptoms.  Still bothersome symptoms despite tamsulosin .  Gland is big on DRE and CT abdomen/pelvis with large intravesical median lobe  2.  Elevated PSA.  Stable over the past 2 years with minimal asymmetry of the prostate  3.  Polyuria.  Perhaps due to poor glucose control?   Plan: 1.  I will add finasteride .  I let them know that this may take some time to work  2.  We will proceed with MRI of prostate.  If suspicions for neoplasia, proceed with fusion biopsy.  We will call his daughter, Paul Edwards with results of MRI  3.  I recommend that he make sure that he is supposed to be on injections of insulin .

## 2024-01-01 ENCOUNTER — Other Ambulatory Visit: Payer: Self-pay | Admitting: Nurse Practitioner

## 2024-01-01 ENCOUNTER — Telehealth: Payer: Self-pay

## 2024-01-01 ENCOUNTER — Encounter: Payer: Self-pay | Admitting: Urology

## 2024-01-01 ENCOUNTER — Ambulatory Visit: Admitting: Urology

## 2024-01-01 VITALS — BP 153/83 | HR 64 | Ht 67.0 in | Wt 185.0 lb

## 2024-01-01 DIAGNOSIS — E1165 Type 2 diabetes mellitus with hyperglycemia: Secondary | ICD-10-CM

## 2024-01-01 DIAGNOSIS — R972 Elevated prostate specific antigen [PSA]: Secondary | ICD-10-CM | POA: Diagnosis not present

## 2024-01-01 DIAGNOSIS — R3589 Other polyuria: Secondary | ICD-10-CM

## 2024-01-01 DIAGNOSIS — N400 Enlarged prostate without lower urinary tract symptoms: Secondary | ICD-10-CM

## 2024-01-01 DIAGNOSIS — N401 Enlarged prostate with lower urinary tract symptoms: Secondary | ICD-10-CM

## 2024-01-01 LAB — URINALYSIS, ROUTINE W REFLEX MICROSCOPIC
Bilirubin, UA: NEGATIVE
Ketones, UA: NEGATIVE
Leukocytes,UA: NEGATIVE
Nitrite, UA: NEGATIVE
Protein,UA: NEGATIVE
RBC, UA: NEGATIVE
Specific Gravity, UA: 1.02 (ref 1.005–1.030)
Urobilinogen, Ur: 0.2 mg/dL (ref 0.2–1.0)
pH, UA: 5.5 (ref 5.0–7.5)

## 2024-01-01 LAB — MICROSCOPIC EXAMINATION

## 2024-01-01 LAB — BLADDER SCAN AMB NON-IMAGING: Scan Result: 25

## 2024-01-01 MED ORDER — FINASTERIDE 5 MG PO TABS: 5.0000 mg | ORAL_TABLET | Freq: Every day | ORAL | 11 refills | Status: AC

## 2024-01-01 NOTE — Telephone Encounter (Unsigned)
 Copied from CRM #[id number]. Topic: Clinical - Medication Refill >> Jan 01, 2024  4:01 PM Roselie BROCKS wrote: Medication: OneTouch Delica Lancets 33G MISC,glucose blood test strip [[id number]]  Has the patient contacted their pharmacy? Yes (Agent: If no, request that the patient contact the pharmacy for the refill. If patient does not wish to contact the pharmacy document the reason why and proceed with request.) (Agent: If yes, when and what did the pharmacy advise?)  This is the patient's preferred pharmacy:  [id number] - [place], [place] - [address] AT [facility] OF [address]  [place] [address] Phone: [phone] Fax: [phone]  Is this the correct pharmacy for this prescription? Yes If no, delete pharmacy and type the correct one.   Has the prescription been filled recently? Yes  Is the patient out of the medication? Yes  Has the patient been seen for an appointment in the last year OR does the patient have an upcoming appointment? Yes  Can we respond through MyChart? Yes  Agent: Please be advised that Rx refills may take up to 3 business days. We ask that you follow-up with your pharmacy.

## 2024-01-01 NOTE — Telephone Encounter (Signed)
 Toujeo  Max Solostar---Rx Clarification from [facility] on [address] .SABRASABRASABRAPlease clarify dose or medication prescribed. Pen is for 300 units per mL however directions only calls for 10 units daily.

## 2024-01-02 ENCOUNTER — Other Ambulatory Visit: Payer: Self-pay

## 2024-01-02 MED ORDER — INSULIN GLARGINE 100 UNIT/ML SOLOSTAR PEN: 10.0000 [IU] | PEN_INJECTOR | Freq: Every day | SUBCUTANEOUS | 3 refills | Status: DC

## 2024-01-02 MED ORDER — GLUCOSE BLOOD VI STRP: ORAL_STRIP | 3 refills | Status: DC

## 2024-01-02 MED ORDER — ACCU-CHEK SOFTCLIX LANCETS MISC: 3 refills | Status: AC

## 2024-01-02 NOTE — Addendum Note (Signed)
 Addended by: Yadira Hada, CAMIE E on: 01/02/2024 08:14 AM   Modules accepted: Orders

## 2024-01-02 NOTE — Telephone Encounter (Signed)
 Toujeo  canceled. Insulin  glargine (lantus ) 100u/mL sent for 10u daily at bedtime.

## 2024-02-09 ENCOUNTER — Ambulatory Visit
Admission: RE | Admit: 2024-02-09 | Discharge: 2024-02-09 | Disposition: A | Discharge status: Home / Self Care | Source: Ambulatory Visit | Attending: Urology | Admitting: Urology

## 2024-02-09 DIAGNOSIS — R972 Elevated prostate specific antigen [PSA]: Secondary | ICD-10-CM

## 2024-02-09 MED ORDER — GADOPICLENOL 0.5 MMOL/ML IV SOLN
8.0000 mL | Freq: Once | INTRAVENOUS | Status: AC | PRN
  Administered 2024-02-09: 8 mL via INTRAVENOUS

## 2024-02-19 ENCOUNTER — Other Ambulatory Visit: Payer: Self-pay | Admitting: Urology

## 2024-02-19 DIAGNOSIS — R972 Elevated prostate specific antigen [PSA]: Secondary | ICD-10-CM

## 2024-04-12 ENCOUNTER — Encounter: Payer: Self-pay | Admitting: Urology

## 2024-04-22 NOTE — Progress Notes (Signed)
 1.12.2026: Called @ 1233-NA

## 2024-05-11 ENCOUNTER — Other Ambulatory Visit: Payer: Self-pay | Admitting: Nurse Practitioner

## 2024-05-11 DIAGNOSIS — I700 Atherosclerosis of aorta: Secondary | ICD-10-CM

## 2024-05-11 DIAGNOSIS — E1169 Type 2 diabetes mellitus with other specified complication: Secondary | ICD-10-CM

## 2024-05-11 DIAGNOSIS — E1165 Type 2 diabetes mellitus with hyperglycemia: Secondary | ICD-10-CM

## 2024-05-11 DIAGNOSIS — E1159 Type 2 diabetes mellitus with other circulatory complications: Secondary | ICD-10-CM

## 2024-05-11 DIAGNOSIS — N401 Enlarged prostate with lower urinary tract symptoms: Secondary | ICD-10-CM

## 2024-05-15 NOTE — Progress Notes (Unsigned)
 Retinal / Diabetic Eye Exam {SEHMDOC:34218}, Influenza Vaccine {SEHMDOC:34218}, and Shingles (Shingrx) Vaccine {SEHMDOC:34218}  Paul Doing, DNP, AGNP-c [facility]  [address] [place], [place] [address] [phone]   ESTABLISHED PATIENT- Chronic Health and/or Follow-Up Visit on 05/16/2024  There were no vitals taken for this visit.   Subjective:  No chief complaint on file.   *** ROS negative except for what is listed in HPI. History, Medications, Surgery, SDOH, and Family History reviewed and updated as appropriate.  Objective:  Physical Exam      Assessment & Plan:   Assessment & Plan Hypertension associated with diabetes (HCC)  Orders:   CBC with Differential/Platelet   CMP14+EGFR   Hemoglobin A1c   Microalbumin/Creatinine Ratio, Urine   amlodipine -atorvastatin  (CADUET ) 10-20 MG tablet; Take 1 tablet by mouth daily. For cholesterol  Benign prostatic hyperplasia with weak urinary stream  Orders:   amlodipine -atorvastatin  (CADUET ) 10-20 MG tablet; Take 1 tablet by mouth daily. For cholesterol  Aortic atherosclerosis  Orders:   amlodipine -atorvastatin  (CADUET ) 10-20 MG tablet; Take 1 tablet by mouth daily. For cholesterol  Type 2 diabetes mellitus with hyperglycemia, with long-term current use of insulin  (HCC)  Orders:   CBC with Differential/Platelet   CMP14+EGFR   Hemoglobin A1c   Microalbumin/Creatinine Ratio, Urine   amlodipine -atorvastatin  (CADUET ) 10-20 MG tablet; Take 1 tablet by mouth daily. For cholesterol   glucose blood test strip; Use up to 4 times per day as directed for Contour plus blue.glucometer. Disp: 100. Refill x99   metFORMIN  (GLUCOPHAGE -XR) 750 MG 24 hr tablet; Take 2 tablets (1,500 mg total) by mouth daily with breakfast.  Hyperlipidemia associated with type 2 diabetes mellitus (HCC)  Orders:   amlodipine -atorvastatin  (CADUET ) 10-20 MG tablet; Take 1 tablet by mouth daily. For  cholesterol  Moderate persistent asthma with acute exacerbation     Sciatica of left side     Bilateral hand pain  Orders:   C-reactive protein   meloxicam  (MOBIC ) 7.5 MG tablet; Take 1 tablet (7.5 mg total) by mouth daily as needed for pain. For hand pain and swelling and Ankle Pain  Primary insomnia     Moderate persistent asthma, unspecified whether complicated  Orders:   albuterol  (VENTOLIN  HFA) 108 (90 Base) MCG/ACT inhaler; Inhale 2 puffs into the lungs every 6 (six) hours as needed for wheezing or shortness of breath.    Paul FORBES Doing, DNP, AGNP-c  {SETIMEYorN (Optional):34216}

## 2024-05-16 ENCOUNTER — Ambulatory Visit: Payer: Self-pay | Admitting: Nurse Practitioner

## 2024-05-16 ENCOUNTER — Encounter: Payer: Self-pay | Admitting: Nurse Practitioner

## 2024-05-16 VITALS — BP 148/84 | HR 70 | Wt 185.4 lb

## 2024-05-16 DIAGNOSIS — J4540 Moderate persistent asthma, uncomplicated: Secondary | ICD-10-CM

## 2024-05-16 DIAGNOSIS — N401 Enlarged prostate with lower urinary tract symptoms: Secondary | ICD-10-CM

## 2024-05-16 DIAGNOSIS — M79641 Pain in right hand: Secondary | ICD-10-CM

## 2024-05-16 DIAGNOSIS — J4541 Moderate persistent asthma with (acute) exacerbation: Secondary | ICD-10-CM

## 2024-05-16 DIAGNOSIS — I700 Atherosclerosis of aorta: Secondary | ICD-10-CM

## 2024-05-16 DIAGNOSIS — F5101 Primary insomnia: Secondary | ICD-10-CM

## 2024-05-16 DIAGNOSIS — E1165 Type 2 diabetes mellitus with hyperglycemia: Secondary | ICD-10-CM

## 2024-05-16 DIAGNOSIS — E1159 Type 2 diabetes mellitus with other circulatory complications: Secondary | ICD-10-CM

## 2024-05-16 DIAGNOSIS — M5432 Sciatica, left side: Secondary | ICD-10-CM

## 2024-05-16 DIAGNOSIS — E1169 Type 2 diabetes mellitus with other specified complication: Secondary | ICD-10-CM

## 2024-05-16 MED ORDER — METFORMIN HCL ER 750 MG PO TB24: 1500.0000 mg | ORAL_TABLET | Freq: Every day | ORAL | 3 refills | Status: AC

## 2024-05-16 MED ORDER — ALBUTEROL SULFATE HFA 108 (90 BASE) MCG/ACT IN AERS: 2.0000 | INHALATION_SPRAY | Freq: Four times a day (QID) | RESPIRATORY_TRACT | 0 refills | Status: AC | PRN

## 2024-05-16 MED ORDER — GLUCOSE BLOOD VI STRP: ORAL_STRIP | 3 refills | Status: AC

## 2024-05-16 MED ORDER — GABAPENTIN 300 MG PO CAPS: 300.0000 mg | ORAL_CAPSULE | Freq: Every day | ORAL | 3 refills | Status: AC

## 2024-05-16 MED ORDER — MELOXICAM 7.5 MG PO TABS: 7.5000 mg | ORAL_TABLET | Freq: Every day | ORAL | 2 refills | Status: AC | PRN

## 2024-05-16 MED ORDER — AMLODIPINE-ATORVASTATIN 10-20 MG PO TABS: 1.0000 | ORAL_TABLET | Freq: Every day | ORAL | 3 refills | Status: AC

## 2024-05-16 NOTE — Assessment & Plan Note (Signed)
" °  Orders:   CBC with Differential/Platelet   CMP14+EGFR   Hemoglobin A1c   Microalbumin/Creatinine Ratio, Urine   amlodipine -atorvastatin  (CADUET ) 10-20 MG tablet; Take 1 tablet by mouth daily. For cholesterol  "

## 2024-05-16 NOTE — Assessment & Plan Note (Signed)
 Paul Edwards

## 2024-05-16 NOTE — Patient Instructions (Addendum)
 Compression Stockings: should be plenty of pressure. You can get these on [employer]. He doesn't need special sizing.   I sent in the prescription for the test strips. I will check to see if we can get the manufacturer to donate.   Meloxicam  for your joint pain as needed. One time a day.

## 2024-05-16 NOTE — Assessment & Plan Note (Signed)
 SABRA

## 2024-05-16 NOTE — Assessment & Plan Note (Signed)
" °  Orders:   amlodipine -atorvastatin  (CADUET ) 10-20 MG tablet; Take 1 tablet by mouth daily. For cholesterol  "

## 2024-05-16 NOTE — Assessment & Plan Note (Signed)
" °  Orders:   CBC with Differential/Platelet   CMP14+EGFR   Hemoglobin A1c   Microalbumin/Creatinine Ratio, Urine   amlodipine -atorvastatin  (CADUET ) 10-20 MG tablet; Take 1 tablet by mouth daily. For cholesterol   glucose blood test strip; Use up to 4 times per day as directed for Contour plus blue.glucometer. Disp: 100. Refill x99   metFORMIN  (GLUCOPHAGE -XR) 750 MG 24 hr tablet; Take 2 tablets (1,500 mg total) by mouth daily with breakfast.  "

## 2024-05-17 LAB — CMP14+EGFR
ALT: 91 [IU]/L — AB (ref 0–44)
AST: 67 [IU]/L — AB (ref 0–40)
Albumin: 5 g/dL — AB (ref 3.9–4.9)
Alkaline Phosphatase: 122 [IU]/L (ref 47–123)
BUN/Creatinine Ratio: 12 (ref 10–24)
BUN: 13 mg/dL (ref 8–27)
Bilirubin Total: 0.3 mg/dL (ref 0.0–1.2)
CO2: 22 mmol/L (ref 20–29)
Calcium: 10.2 mg/dL (ref 8.6–10.2)
Chloride: 101 mmol/L (ref 96–106)
Creatinine, Ser: 1.06 mg/dL (ref 0.76–1.27)
Globulin, Total: 3 g/dL (ref 1.5–4.5)
Glucose: 113 mg/dL — AB (ref 70–99)
Potassium: 4.4 mmol/L (ref 3.5–5.2)
Sodium: 142 mmol/L (ref 134–144)
Total Protein: 8 g/dL (ref 6.0–8.5)
eGFR: 76 mL/min/{1.73_m2}

## 2024-05-17 LAB — CBC WITH DIFFERENTIAL/PLATELET
Basophils Absolute: 0.1 10*3/uL (ref 0.0–0.2)
Basos: 1 %
EOS (ABSOLUTE): 0.4 10*3/uL (ref 0.0–0.4)
Eos: 4 %
Hematocrit: 46.9 % (ref 37.5–51.0)
Hemoglobin: 14.9 g/dL (ref 13.0–17.7)
Immature Grans (Abs): 0 10*3/uL (ref 0.0–0.1)
Immature Granulocytes: 0 %
Lymphocytes Absolute: 1.9 10*3/uL (ref 0.7–3.1)
Lymphs: 23 %
MCH: 26.7 pg (ref 26.6–33.0)
MCHC: 31.8 g/dL (ref 31.5–35.7)
MCV: 84 fL (ref 79–97)
Monocytes Absolute: 0.7 10*3/uL (ref 0.1–0.9)
Monocytes: 8 %
Neutrophils Absolute: 5.2 10*3/uL (ref 1.4–7.0)
Neutrophils: 63 %
Platelets: 232 10*3/uL (ref 150–450)
RBC: 5.58 x10E6/uL (ref 4.14–5.80)
RDW: 12.5 % (ref 11.6–15.4)
WBC: 8.2 10*3/uL (ref 3.4–10.8)

## 2024-05-17 LAB — MICROALBUMIN / CREATININE URINE RATIO
Creatinine, Urine: 277.3 mg/dL
Microalb/Creat Ratio: 41 mg/g{creat} — AB (ref 0–29)
Microalbumin, Urine: 113 ug/mL

## 2024-05-17 LAB — HEMOGLOBIN A1C
Est. average glucose Bld gHb Est-mCnc: 194 mg/dL
Hgb A1c MFr Bld: 8.4 — AB (ref 4.8–5.6)

## 2024-05-17 LAB — C-REACTIVE PROTEIN: CRP: <1 mg/L (ref 0–10)

## 2024-05-20 ENCOUNTER — Ambulatory Visit: Admitting: Urology

## 2024-11-14 ENCOUNTER — Ambulatory Visit: Admitting: Nurse Practitioner
